# Patient Record
Sex: Female | Born: 1985 | Race: White | Hispanic: No | Smoking: Never smoker
Health system: Southern US, Community
[De-identification: ages and names within clinical notes are randomized; demographics above are authoritative.]

## PROBLEM LIST (undated history)

## (undated) ENCOUNTER — Inpatient Hospital Stay (HOSPITAL_COMMUNITY): Payer: Self-pay

## (undated) DIAGNOSIS — M5442 Lumbago with sciatica, left side: Secondary | ICD-10-CM

## (undated) DIAGNOSIS — F32A Depression, unspecified: Secondary | ICD-10-CM

## (undated) DIAGNOSIS — R001 Bradycardia, unspecified: Secondary | ICD-10-CM

## (undated) DIAGNOSIS — E663 Overweight: Secondary | ICD-10-CM

## (undated) DIAGNOSIS — J302 Other seasonal allergic rhinitis: Secondary | ICD-10-CM

## (undated) DIAGNOSIS — N946 Dysmenorrhea, unspecified: Secondary | ICD-10-CM

## (undated) DIAGNOSIS — J45909 Unspecified asthma, uncomplicated: Secondary | ICD-10-CM

## (undated) DIAGNOSIS — F419 Anxiety disorder, unspecified: Secondary | ICD-10-CM

## (undated) DIAGNOSIS — L239 Allergic contact dermatitis, unspecified cause: Secondary | ICD-10-CM

## (undated) DIAGNOSIS — F329 Major depressive disorder, single episode, unspecified: Secondary | ICD-10-CM

## (undated) DIAGNOSIS — D500 Iron deficiency anemia secondary to blood loss (chronic): Secondary | ICD-10-CM

## (undated) DIAGNOSIS — Z803 Family history of malignant neoplasm of breast: Secondary | ICD-10-CM

## (undated) DIAGNOSIS — N920 Excessive and frequent menstruation with regular cycle: Secondary | ICD-10-CM

## (undated) DIAGNOSIS — M79671 Pain in right foot: Secondary | ICD-10-CM

## (undated) HISTORY — PX: MYRINGOPLASTY: SUR873

## (undated) HISTORY — DX: Iron deficiency anemia secondary to blood loss (chronic): D50.0

## (undated) HISTORY — DX: Dysmenorrhea, unspecified: N94.6

## (undated) HISTORY — DX: Overweight: E66.3

## (undated) HISTORY — DX: Depression, unspecified: F32.A

## (undated) HISTORY — DX: Unspecified asthma, uncomplicated: J45.909

## (undated) HISTORY — PX: NO PAST SURGERIES: SHX2092

## (undated) HISTORY — DX: Pain in right foot: M79.671

## (undated) HISTORY — DX: Allergic contact dermatitis, unspecified cause: L23.9

## (undated) HISTORY — DX: Family history of malignant neoplasm of breast: Z80.3

## (undated) HISTORY — DX: Anxiety disorder, unspecified: F41.9

## (undated) HISTORY — DX: Lumbago with sciatica, left side: M54.42

## (undated) HISTORY — DX: Major depressive disorder, single episode, unspecified: F32.9

## (undated) HISTORY — DX: Other seasonal allergic rhinitis: J30.2

## (undated) HISTORY — DX: Excessive and frequent menstruation with regular cycle: N92.0

---

## 2014-10-13 ENCOUNTER — Encounter: Payer: Self-pay | Admitting: Family Medicine

## 2014-10-13 ENCOUNTER — Ambulatory Visit (INDEPENDENT_AMBULATORY_CARE_PROVIDER_SITE_OTHER): Payer: BC Managed Care – PPO | Admitting: Family Medicine

## 2014-10-13 VITALS — BP 118/70 | HR 104 | Temp 98.4°F | Resp 16 | Ht 68.0 in | Wt 199.3 lb

## 2014-10-13 DIAGNOSIS — N946 Dysmenorrhea, unspecified: Secondary | ICD-10-CM | POA: Insufficient documentation

## 2014-10-13 DIAGNOSIS — M5440 Lumbago with sciatica, unspecified side: Secondary | ICD-10-CM | POA: Insufficient documentation

## 2014-10-13 DIAGNOSIS — H60339 Swimmer's ear, unspecified ear: Secondary | ICD-10-CM | POA: Insufficient documentation

## 2014-10-13 DIAGNOSIS — N920 Excessive and frequent menstruation with regular cycle: Secondary | ICD-10-CM | POA: Insufficient documentation

## 2014-10-13 DIAGNOSIS — Z111 Encounter for screening for respiratory tuberculosis: Secondary | ICD-10-CM | POA: Insufficient documentation

## 2014-10-13 DIAGNOSIS — Z309 Encounter for contraceptive management, unspecified: Secondary | ICD-10-CM | POA: Insufficient documentation

## 2014-10-13 DIAGNOSIS — Z3040 Encounter for surveillance of contraceptives, unspecified: Secondary | ICD-10-CM

## 2014-10-13 DIAGNOSIS — J302 Other seasonal allergic rhinitis: Secondary | ICD-10-CM | POA: Insufficient documentation

## 2014-10-13 DIAGNOSIS — IMO0001 Reserved for inherently not codable concepts without codable children: Secondary | ICD-10-CM | POA: Insufficient documentation

## 2014-10-13 DIAGNOSIS — M79673 Pain in unspecified foot: Secondary | ICD-10-CM | POA: Insufficient documentation

## 2014-10-13 DIAGNOSIS — L239 Allergic contact dermatitis, unspecified cause: Secondary | ICD-10-CM | POA: Insufficient documentation

## 2014-10-13 DIAGNOSIS — J069 Acute upper respiratory infection, unspecified: Secondary | ICD-10-CM | POA: Insufficient documentation

## 2014-10-13 DIAGNOSIS — Z9229 Personal history of other drug therapy: Secondary | ICD-10-CM | POA: Insufficient documentation

## 2014-10-13 DIAGNOSIS — F418 Other specified anxiety disorders: Secondary | ICD-10-CM

## 2014-10-13 DIAGNOSIS — F329 Major depressive disorder, single episode, unspecified: Secondary | ICD-10-CM

## 2014-10-13 DIAGNOSIS — R21 Rash and other nonspecific skin eruption: Secondary | ICD-10-CM | POA: Insufficient documentation

## 2014-10-13 DIAGNOSIS — Z9109 Other allergy status, other than to drugs and biological substances: Secondary | ICD-10-CM | POA: Insufficient documentation

## 2014-10-13 DIAGNOSIS — R300 Dysuria: Secondary | ICD-10-CM | POA: Insufficient documentation

## 2014-10-13 DIAGNOSIS — Z0000 Encounter for general adult medical examination without abnormal findings: Secondary | ICD-10-CM | POA: Insufficient documentation

## 2014-10-13 DIAGNOSIS — J45909 Unspecified asthma, uncomplicated: Secondary | ICD-10-CM | POA: Insufficient documentation

## 2014-10-13 DIAGNOSIS — Z713 Dietary counseling and surveillance: Secondary | ICD-10-CM | POA: Insufficient documentation

## 2014-10-13 DIAGNOSIS — Z13220 Encounter for screening for lipoid disorders: Secondary | ICD-10-CM | POA: Insufficient documentation

## 2014-10-13 DIAGNOSIS — Z124 Encounter for screening for malignant neoplasm of cervix: Secondary | ICD-10-CM | POA: Insufficient documentation

## 2014-10-13 DIAGNOSIS — D509 Iron deficiency anemia, unspecified: Secondary | ICD-10-CM | POA: Insufficient documentation

## 2014-10-13 DIAGNOSIS — J029 Acute pharyngitis, unspecified: Secondary | ICD-10-CM | POA: Insufficient documentation

## 2014-10-13 DIAGNOSIS — R238 Other skin changes: Secondary | ICD-10-CM | POA: Insufficient documentation

## 2014-10-13 DIAGNOSIS — Z23 Encounter for immunization: Secondary | ICD-10-CM | POA: Insufficient documentation

## 2014-10-13 DIAGNOSIS — Z719 Counseling, unspecified: Secondary | ICD-10-CM | POA: Insufficient documentation

## 2014-10-13 DIAGNOSIS — F419 Anxiety disorder, unspecified: Secondary | ICD-10-CM | POA: Insufficient documentation

## 2014-10-13 DIAGNOSIS — M5430 Sciatica, unspecified side: Secondary | ICD-10-CM | POA: Insufficient documentation

## 2014-10-13 DIAGNOSIS — E663 Overweight: Secondary | ICD-10-CM | POA: Insufficient documentation

## 2014-10-13 DIAGNOSIS — Z3042 Encounter for surveillance of injectable contraceptive: Secondary | ICD-10-CM | POA: Insufficient documentation

## 2014-10-13 MED ORDER — FLUOXETINE HCL 20 MG PO TABS: 20.0000 mg | ORAL_TABLET | Freq: Every day | ORAL | Status: DC

## 2014-10-13 NOTE — Patient Instructions (Signed)
Intrauterine Device Information An intrauterine device (IUD) is inserted into your uterus to prevent pregnancy. There are two types of IUDs available:   Copper IUD--This type of IUD is wrapped in copper wire and is placed inside the uterus. Copper makes the uterus and fallopian tubes produce a fluid that kills sperm. The copper IUD can stay in place for 10 years.  Hormone IUD--This type of IUD contains the hormone progestin (synthetic progesterone). The hormone thickens the cervical mucus and prevents sperm from entering the uterus. It also thins the uterine lining to prevent implantation of a fertilized egg. The hormone can weaken or kill the sperm that get into the uterus. One type of hormone IUD can stay in place for 5 years, and another type can stay in place for 3 years. Your health care provider will make sure you are a good candidate for a contraceptive IUD. Discuss with your health care provider the possible side effects.  ADVANTAGES OF AN INTRAUTERINE DEVICE  IUDs are highly effective, reversible, long acting, and low maintenance.   There are no estrogen-related side effects.   An IUD can be used when breastfeeding.   IUDs are not associated with weight gain.   The copper IUD works immediately after insertion.   The hormone IUD works right away if inserted within 7 days of your period starting. You will need to use a backup method of birth control for 7 days if the hormone IUD is inserted at any other time in your cycle.  The copper IUD does not interfere with your female hormones.   The hormone IUD can make heavy menstrual periods lighter and decrease cramping.   The hormone IUD can be used for 3 or 5 years.   The copper IUD can be used for 10 years. DISADVANTAGES OF AN INTRAUTERINE DEVICE  The hormone IUD can be associated with irregular bleeding patterns.   The copper IUD can make your menstrual flow heavier and more painful.   You may experience cramping and  vaginal bleeding after insertion.  Document Released: 02/05/2004 Document Revised: 11/03/2012 Document Reviewed: 08/22/2012 Exit[employer] Patient Information 2015 [employer]. This information is not intended to replace advice given to you by your health care provider. Make sure you discuss any questions you have with your health care provider.  

## 2014-10-13 NOTE — Progress Notes (Signed)
Name: Anna Whitehead   MRN: [id number]    DOB: 1985-10-29   Date:10/13/2014       Progress Note  Subjective  Chief Complaint  Chief Complaint  Patient presents with  . Anxiety    patient has had a lot going on since the first of the year: marriage, new job and starting a new birht control.    HPI  Alayla Dethlefs is a pleasant 29 year old patient who presents to clinic today to discuss her moods although appointment was initially for birth control but she states this is related. She was previously on Depo Provera but due to intolerable mood fluctuations she returned to her Sprintec OCP which she is taking consistently. Her PCP Dr. Carlynn Purl prescribed Jackey Loge Ring at some point and referred patient to OB/GYN in [facility] for insertion of IUD. She did not use Nuva Ring and is still on Sprintec OCPs and notes weight gain of about 30lbs in general. She did consult with Gynecologist and is still undecided on IUD, considering Paraguard over Mirena due to no hormones which she feels affects her weight and moods. She has previous had anxiety and was on Prozac about 10 years ago and is feeling more overwhelmed these days which the changes going on in her life. Her mother has been on Prozac for decades and works well for her. She is not overtly depressed, down, crying, suicidal or unmotivated.   Patient Active Problem List   Diagnosis Date Noted  . Abnormal skin color 10/13/2014  . Encounter for general adult medical examination without abnormal findings 10/13/2014  . Asthma, mild 10/13/2014  . Cervical cancer screening 10/13/2014  . Depo contraception 10/13/2014  . Dysmenorrhea 10/13/2014  . Difficult or painful urination 10/13/2014  . Allergic contact dermatitis 10/13/2014  . Allergy to environmental factors 10/13/2014  . Beach ear 10/13/2014  . Contraceptive management 10/13/2014  . Foot pain 10/13/2014  . Gravida 0 10/13/2014  . Encounter for counseling 10/13/2014  . Need for vaccination 10/13/2014   . H/O: depression 10/13/2014  . Encounter for immunization 10/13/2014  . Anemia, iron deficiency 10/13/2014  . Encounter for screening for lipoid disorders 10/13/2014  . H/O high risk medication treatment 10/13/2014  . Excess, menstruation 10/13/2014  . Low back pain with sciatica 10/13/2014  . Overweight 10/13/2014  . Cutaneous eruption 10/13/2014  . Allergic rhinitis, seasonal 10/13/2014  . Sore throat 10/13/2014  . Acute upper respiratory infection 10/13/2014  . Encounter for screening for respiratory tuberculosis 10/13/2014  . Dietary counseling and surveillance 10/13/2014    History  Substance Use Topics  . Smoking status: Never Smoker   . Smokeless tobacco: Not on file  . Alcohol Use: 0.0 oz/week    0 Standard drinks or equivalent per week     Comment: ocassionally     Current outpatient prescriptions:  .  albuterol (VENTOLIN HFA) 108 (90 BASE) MCG/ACT inhaler, Inhale into the lungs., Disp: , Rfl:  .  norgestimate-ethinyl estradiol (MONONESSA) 0.25-35 MG-MCG tablet, Take by mouth., Disp: , Rfl:   Past Surgical History  Procedure Laterality Date  . Myringoplasty      Family History  Problem Relation Age of Onset  . Bipolar disorder Mother   . Cancer Maternal Aunt     3 of her aunts had breast cancer: 2 passed away    No Known Allergies  Depression screen [facility] 2/9 10/13/2014  Decreased Interest 1  Down, Depressed, Hopeless 1  PHQ - 2 Score 2  Altered sleeping 1  Tired, decreased energy 1  Change in appetite 1  Feeling bad or failure about yourself  2  Trouble concentrating 1  Moving slowly or fidgety/restless 0  Suicidal thoughts 0  PHQ-9 Score 8  Difficult doing work/chores Somewhat difficult    Review of Systems  Ten systems reviewed and is negative except as mentioned in HPI.  Objective  BP 118/70 mmHg  Pulse 104  Temp(Src) 98.4 F (36.9 C) (Oral)  Resp 16  Ht  (1.727 m)  Wt 199 lb 4.8 oz (90.402 kg)  BMI 30.31 kg/m2  SpO2 99%  LMP  09/29/2014 (Approximate) Body mass index is 30.31 kg/(m^2).  Physical Exam  Constitutional: Patient is overweight and well-nourished. In no distress.   Neck: Normal range of motion. Neck supple. No JVD present. No thyromegaly present.  Cardiovascular: Normal rate, regular rhythm and normal heart sounds.  No murmur heard.  Pulmonary/Chest: Effort normal and breath sounds normal. No respiratory distress. Musculoskeletal: Normal range of motion bilateral UE and LE, no joint effusions. Peripheral vascular: Bilateral LE no edema. Neurological: CN II-XII grossly intact with no focal deficits. Alert and oriented to person, place, and time. Coordination, balance, strength, speech and gait are normal.  Skin: Skin is warm and dry. No rash noted. No erythema.  Psychiatric: Patient has a normal mood and affect. Behavior is normal in office today. Judgment and thought content normal in office today.  Assessment & Plan  1. Encounter for surveillance of contraceptives We discussed her family planning goals. Recently married, planning on children in 1-2 years. Encouraged her to consider IUD if she is worried about the side effects from hormonal birth control.   2. Anxiety and depression Start back on Prozac. The patient has been counseled on the proper use, side effects and potential interactions of the new medication. Patient encouraged to review the side effects and safety profile pamphlet provided with the prescription from the pharmacy as well as request counseling from the pharmacy team as needed.   - FLUoxetine (PROZAC) 20 MG tablet; Take 1 tablet (20 mg total) by mouth daily.  Dispense: 30 tablet; Refill: 3

## 2015-03-18 NOTE — L&D Delivery Note (Signed)
Delivery Note  G1 at 5250w2d, IOL / Gest HTN  Complete dilation at 1904 Onset of pushing at 1910 FHR second stage Category 1  Analgesia /Anesthesia intrapartum: epidural  Delivery of a viable female at 2044 by CNM in OA to LOT position.  Nuchal Cord none. Cord double clamped at 30 sec., cut by Para Marchuncan, FOB..  Cord blood sample collected. Collection of cord blood donation performed.  Placenta delivered Schultz intact with 3 VC.  Placenta to L&D for disposal. Uterine tone firm, bleeding small  2nd degree vaginal floor laceration identified.  Anesthesia: epidural Repair 3.0 vicryl in standard fashion, good hemostasis Est. Blood Loss (mL): 300  Complications: none APGAR: 9/9 Mom to postpartum.  Baby to Couplet care / Skin to Skin.  Neta Mendsaniela C Paul, CNM, MSN 11/14/2015, 9:34 PM

## 2015-10-29 ENCOUNTER — Encounter (HOSPITAL_COMMUNITY): Payer: Self-pay | Admitting: *Deleted

## 2015-10-29 ENCOUNTER — Inpatient Hospital Stay (HOSPITAL_COMMUNITY)
Admission: AD | Admit: 2015-10-29 | Discharge: 2015-10-29 | Disposition: A | Discharge status: Home / Self Care | Payer: BC Managed Care – PPO | Source: Ambulatory Visit | Attending: Obstetrics and Gynecology | Admitting: Obstetrics and Gynecology

## 2015-10-29 DIAGNOSIS — Z3A00 Weeks of gestation of pregnancy not specified: Secondary | ICD-10-CM | POA: Insufficient documentation

## 2015-10-29 DIAGNOSIS — Z3403 Encounter for supervision of normal first pregnancy, third trimester: Secondary | ICD-10-CM | POA: Insufficient documentation

## 2015-10-29 MED ORDER — LACTATED RINGERS IV BOLUS (SEPSIS)
1000.0000 mL | Freq: Once | INTRAVENOUS | Status: AC
  Administered 2015-10-29: 1000 mL via INTRAVENOUS

## 2015-10-29 MED ORDER — NALBUPHINE HCL 10 MG/ML IJ SOLN
10.0000 mg | Freq: Once | INTRAMUSCULAR | Status: AC
  Administered 2015-10-29: 10 mg via SUBCUTANEOUS
  Filled 2015-10-29: qty 1

## 2015-10-29 NOTE — Discharge Instructions (Signed)
Fetal Movement Counts °Patient Name: __________________________________________________ Patient Due Date: ____________________ °Performing a fetal movement count is highly recommended in high-risk pregnancies, but it is good for every pregnant woman to do. Your health care provider may ask you to start counting fetal movements at 28 weeks of the pregnancy. Fetal movements often increase: °· After eating a full meal. °· After physical activity. °· After eating or drinking something sweet or cold. °· At rest. °Pay attention to when you feel the baby is most active. This will help you notice a pattern of your baby's sleep and wake cycles and what factors contribute to an increase in fetal movement. It is important to perform a fetal movement count at the same time each day when your baby is normally most active.  °HOW TO COUNT FETAL MOVEMENTS °1. Find a quiet and comfortable area to sit or lie down on your left side. Lying on your left side provides the best blood and oxygen circulation to your baby. °2. Write down the day and time on a sheet of paper or in a journal. °3. Start counting kicks, flutters, swishes, rolls, or jabs in a 2-hour period. You should feel at least 10 movements within 2 hours. °4. If you do not feel 10 movements in 2 hours, wait 2-3 hours and count again. Look for a change in the pattern or not enough counts in 2 hours. °SEEK MEDICAL CARE IF: °· You feel less than 10 counts in 2 hours, tried twice. °· There is no movement in over an hour. °· The pattern is changing or taking longer each day to reach 10 counts in 2 hours. °· You feel the baby is not moving as he or she usually does. °Date: ____________ Movements: ____________ Start time: ____________ Finish time: ____________  °Date: ____________ Movements: ____________ Start time: ____________ Finish time: ____________ °Date: ____________ Movements: ____________ Start time: ____________ Finish time: ____________ °Date: ____________ Movements:  ____________ Start time: ____________ Finish time: ____________ °Date: ____________ Movements: ____________ Start time: ____________ Finish time: ____________ °Date: ____________ Movements: ____________ Start time: ____________ Finish time: ____________ °Date: ____________ Movements: ____________ Start time: ____________ Finish time: ____________ °Date: ____________ Movements: ____________ Start time: ____________ Finish time: ____________  °Date: ____________ Movements: ____________ Start time: ____________ Finish time: ____________ °Date: ____________ Movements: ____________ Start time: ____________ Finish time: ____________ °Date: ____________ Movements: ____________ Start time: ____________ Finish time: ____________ °Date: ____________ Movements: ____________ Start time: ____________ Finish time: ____________ °Date: ____________ Movements: ____________ Start time: ____________ Finish time: ____________ °Date: ____________ Movements: ____________ Start time: ____________ Finish time: ____________ °Date: ____________ Movements: ____________ Start time: ____________ Finish time: ____________  °Date: ____________ Movements: ____________ Start time: ____________ Finish time: ____________ °Date: ____________ Movements: ____________ Start time: ____________ Finish time: ____________ °Date: ____________ Movements: ____________ Start time: ____________ Finish time: ____________ °Date: ____________ Movements: ____________ Start time: ____________ Finish time: ____________ °Date: ____________ Movements: ____________ Start time: ____________ Finish time: ____________ °Date: ____________ Movements: ____________ Start time: ____________ Finish time: ____________ °Date: ____________ Movements: ____________ Start time: ____________ Finish time: ____________  °Date: ____________ Movements: ____________ Start time: ____________ Finish time: ____________ °Date: ____________ Movements: ____________ Start time: ____________ Finish  time: ____________ °Date: ____________ Movements: ____________ Start time: ____________ Finish time: ____________ °Date: ____________ Movements: ____________ Start time: ____________ Finish time: ____________ °Date: ____________ Movements: ____________ Start time: ____________ Finish time: ____________ °Date: ____________ Movements: ____________ Start time: ____________ Finish time: ____________ °Date: ____________ Movements: ____________ Start time: ____________ Finish time: ____________  °Date: ____________ Movements: ____________ Start time: ____________ Finish   time: ____________ °Date: ____________ Movements: ____________ Start time: ____________ Finish time: ____________ °Date: ____________ Movements: ____________ Start time: ____________ Finish time: ____________ °Date: ____________ Movements: ____________ Start time: ____________ Finish time: ____________ °Date: ____________ Movements: ____________ Start time: ____________ Finish time: ____________ °Date: ____________ Movements: ____________ Start time: ____________ Finish time: ____________ °Date: ____________ Movements: ____________ Start time: ____________ Finish time: ____________  °Date: ____________ Movements: ____________ Start time: ____________ Finish time: ____________ °Date: ____________ Movements: ____________ Start time: ____________ Finish time: ____________ °Date: ____________ Movements: ____________ Start time: ____________ Finish time: ____________ °Date: ____________ Movements: ____________ Start time: ____________ Finish time: ____________ °Date: ____________ Movements: ____________ Start time: ____________ Finish time: ____________ °Date: ____________ Movements: ____________ Start time: ____________ Finish time: ____________ °Date: ____________ Movements: ____________ Start time: ____________ Finish time: ____________  °Date: ____________ Movements: ____________ Start time: ____________ Finish time: ____________ °Date: ____________  Movements: ____________ Start time: ____________ Finish time: ____________ °Date: ____________ Movements: ____________ Start time: ____________ Finish time: ____________ °Date: ____________ Movements: ____________ Start time: ____________ Finish time: ____________ °Date: ____________ Movements: ____________ Start time: ____________ Finish time: ____________ °Date: ____________ Movements: ____________ Start time: ____________ Finish time: ____________ °Date: ____________ Movements: ____________ Start time: ____________ Finish time: ____________  °Date: ____________ Movements: ____________ Start time: ____________ Finish time: ____________ °Date: ____________ Movements: ____________ Start time: ____________ Finish time: ____________ °Date: ____________ Movements: ____________ Start time: ____________ Finish time: ____________ °Date: ____________ Movements: ____________ Start time: ____________ Finish time: ____________ °Date: ____________ Movements: ____________ Start time: ____________ Finish time: ____________ °Date: ____________ Movements: ____________ Start time: ____________ Finish time: ____________ °  °This information is not intended to replace advice given to you by your health care provider. Make sure you discuss any questions you have with your health care provider. °  °Document Released: 04/02/2006 Document Revised: 03/24/2014 Document Reviewed: 12/29/2011 °Elsevier Interactive Patient Education ©2016 [employer] °Braxton Hicks Contractions °Contractions of the uterus can occur throughout pregnancy. Contractions are not always a sign that you are in labor.  °WHAT ARE BRAXTON HICKS CONTRACTIONS?  °Contractions that occur before labor are called Braxton Hicks contractions, or false labor. Toward the end of pregnancy (32-34 weeks), these contractions can develop more often and may become more forceful. This is not true labor because these contractions do not result in opening (dilatation) and thinning of  the cervix. They are sometimes difficult to tell apart from true labor because these contractions can be forceful and people have different pain tolerances. You should not feel embarrassed if you go to the hospital with false labor. Sometimes, the only way to tell if you are in true labor is for your health care provider to look for changes in the cervix. °If there are no prenatal problems or other health problems associated with the pregnancy, it is completely safe to be sent home with false labor and await the onset of true labor. °HOW CAN YOU TELL THE DIFFERENCE BETWEEN TRUE AND FALSE LABOR? °False Labor °· The contractions of false labor are usually shorter and not as hard as those of true labor.   °· The contractions are usually irregular.   °· The contractions are often felt in the front of the lower abdomen and in the groin.   °· The contractions may go away when you walk around or change positions while lying down.   °· The contractions get weaker and are shorter lasting as time goes on.   °· The contractions do not usually become progressively stronger, regular, and closer together as with true labor.   °True Labor °· Contractions in true   labor last 30-70 seconds, become very regular, usually become more intense, and increase in frequency.   °· The contractions do not go away with walking.   °· The discomfort is usually felt in the top of the uterus and spreads to the lower abdomen and low back.   °· True labor can be determined by your health care provider with an exam. This will show that the cervix is dilating and getting thinner.   °WHAT TO REMEMBER °· Keep up with your usual exercises and follow other instructions given by your health care provider.   °· Take medicines as directed by your health care provider.   °· Keep your regular prenatal appointments.   °· Eat and drink lightly if you think you are going into labor.   °· If Braxton Hicks contractions are making you uncomfortable:   °¨ Change your  position from lying down or resting to walking, or from walking to resting.   °¨ Sit and rest in a tub of warm water.   °¨ Drink 2-3 glasses of water. Dehydration may cause these contractions.   °¨ Do slow and deep breathing several times an hour.   °WHEN SHOULD I SEEK IMMEDIATE MEDICAL CARE? °Seek immediate medical care if: °· Your contractions become stronger, more regular, and closer together.   °· You have fluid leaking or gushing from your vagina.   °· You have a fever.   °· You pass blood-tinged mucus.   °· You have vaginal bleeding.   °· You have continuous abdominal pain.   °· You have low back pain that you never had before.   °· You feel your baby's head pushing down and causing pelvic pressure.   °· Your baby is not moving as much as it used to.   °  °This information is not intended to replace advice given to you by your health care provider. Make sure you discuss any questions you have with your health care provider. °  °Document Released: 03/03/2005 Document Revised: 03/08/2013 Document Reviewed: 12/13/2012 °Elsevier Interactive Patient Education ©2016 [employer] ° °

## 2015-11-13 ENCOUNTER — Encounter (HOSPITAL_COMMUNITY): Payer: Self-pay | Admitting: *Deleted

## 2015-11-13 ENCOUNTER — Inpatient Hospital Stay (HOSPITAL_COMMUNITY)
Admission: AD | Admit: 2015-11-13 | Discharge: 2015-11-16 | DRG: 775 | Disposition: A | Discharge status: Home / Self Care | Payer: BC Managed Care – PPO | Source: Ambulatory Visit | Attending: Obstetrics & Gynecology | Admitting: Obstetrics & Gynecology

## 2015-11-13 DIAGNOSIS — Z6791 Unspecified blood type, Rh negative: Secondary | ICD-10-CM | POA: Diagnosis not present

## 2015-11-13 DIAGNOSIS — Z3490 Encounter for supervision of normal pregnancy, unspecified, unspecified trimester: Secondary | ICD-10-CM

## 2015-11-13 DIAGNOSIS — F329 Major depressive disorder, single episode, unspecified: Secondary | ICD-10-CM | POA: Diagnosis present

## 2015-11-13 DIAGNOSIS — O99344 Other mental disorders complicating childbirth: Secondary | ICD-10-CM | POA: Diagnosis present

## 2015-11-13 DIAGNOSIS — F418 Other specified anxiety disorders: Secondary | ICD-10-CM | POA: Diagnosis present

## 2015-11-13 DIAGNOSIS — D509 Iron deficiency anemia, unspecified: Secondary | ICD-10-CM | POA: Diagnosis present

## 2015-11-13 DIAGNOSIS — Z3A38 38 weeks gestation of pregnancy: Secondary | ICD-10-CM | POA: Diagnosis not present

## 2015-11-13 DIAGNOSIS — D62 Acute posthemorrhagic anemia: Secondary | ICD-10-CM | POA: Diagnosis present

## 2015-11-13 DIAGNOSIS — O134 Gestational [pregnancy-induced] hypertension without significant proteinuria, complicating childbirth: Secondary | ICD-10-CM | POA: Diagnosis present

## 2015-11-13 DIAGNOSIS — O26893 Other specified pregnancy related conditions, third trimester: Secondary | ICD-10-CM | POA: Diagnosis present

## 2015-11-13 DIAGNOSIS — O99824 Streptococcus B carrier state complicating childbirth: Secondary | ICD-10-CM | POA: Diagnosis present

## 2015-11-13 DIAGNOSIS — O9081 Anemia of the puerperium: Secondary | ICD-10-CM | POA: Diagnosis present

## 2015-11-13 DIAGNOSIS — Z8759 Personal history of other complications of pregnancy, childbirth and the puerperium: Secondary | ICD-10-CM | POA: Diagnosis present

## 2015-11-13 DIAGNOSIS — F32A Depression, unspecified: Secondary | ICD-10-CM | POA: Diagnosis present

## 2015-11-13 DIAGNOSIS — F419 Anxiety disorder, unspecified: Secondary | ICD-10-CM | POA: Diagnosis present

## 2015-11-13 LAB — COMPREHENSIVE METABOLIC PANEL
ALBUMIN: 3.2 g/dL — AB (ref 3.5–5.0)
ALT: 11 U/L — ABNORMAL LOW (ref 14–54)
ANION GAP: 5 (ref 5–15)
AST: 21 U/L (ref 15–41)
Alkaline Phosphatase: 121 U/L (ref 38–126)
BILIRUBIN TOTAL: 0.4 mg/dL (ref 0.3–1.2)
BUN: 9 mg/dL (ref 6–20)
CO2: 25 mmol/L (ref 22–32)
Calcium: 8.5 mg/dL — ABNORMAL LOW (ref 8.9–10.3)
Chloride: 105 mmol/L (ref 101–111)
Creatinine, Ser: 0.63 mg/dL (ref 0.44–1.00)
Glucose, Bld: 107 mg/dL — ABNORMAL HIGH (ref 65–99)
POTASSIUM: 3.7 mmol/L (ref 3.5–5.1)
Sodium: 135 mmol/L (ref 135–145)
TOTAL PROTEIN: 6.6 g/dL (ref 6.5–8.1)

## 2015-11-13 LAB — OB RESULTS CONSOLE HIV ANTIBODY (ROUTINE TESTING): HIV: NONREACTIVE

## 2015-11-13 LAB — CBC
HCT: 30.8 % — ABNORMAL LOW (ref 36.0–46.0)
HEMOGLOBIN: 9.3 g/dL — AB (ref 12.0–15.0)
MCH: 24.7 pg — ABNORMAL LOW (ref 26.0–34.0)
MCHC: 30.2 g/dL (ref 30.0–36.0)
MCV: 81.9 fL (ref 78.0–100.0)
Platelets: 182 10*3/uL (ref 150–400)
RBC: 3.76 MIL/uL — ABNORMAL LOW (ref 3.87–5.11)
RDW: 17.2 % — AB (ref 11.5–15.5)
WBC: 7.3 10*3/uL (ref 4.0–10.5)

## 2015-11-13 LAB — TYPE AND SCREEN
ABO/RH(D): O NEG
ANTIBODY SCREEN: NEGATIVE

## 2015-11-13 LAB — OB RESULTS CONSOLE GC/CHLAMYDIA
Chlamydia: NEGATIVE
Gonorrhea: NEGATIVE

## 2015-11-13 LAB — ABO/RH: ABO/RH(D): O NEG

## 2015-11-13 LAB — OB RESULTS CONSOLE HEPATITIS B SURFACE ANTIGEN: HEP B S AG: NEGATIVE

## 2015-11-13 LAB — LACTATE DEHYDROGENASE: LDH: 173 U/L (ref 98–192)

## 2015-11-13 LAB — PROTEIN / CREATININE RATIO, URINE
Creatinine, Urine: 131 mg/dL
Protein Creatinine Ratio: 0.08 mg/mg{Cre} (ref 0.00–0.15)
TOTAL PROTEIN, URINE: 11 mg/dL

## 2015-11-13 LAB — OB RESULTS CONSOLE RPR: RPR: NONREACTIVE

## 2015-11-13 LAB — OB RESULTS CONSOLE RUBELLA ANTIBODY, IGM: Rubella: IMMUNE

## 2015-11-13 LAB — OB RESULTS CONSOLE ABO/RH: RH TYPE: NEGATIVE

## 2015-11-13 LAB — OB RESULTS CONSOLE GBS: STREP GROUP B AG: POSITIVE

## 2015-11-13 LAB — OB RESULTS CONSOLE ANTIBODY SCREEN: ANTIBODY SCREEN: NEGATIVE

## 2015-11-13 MED ORDER — FENTANYL CITRATE (PF) 100 MCG/2ML IJ SOLN
100.0000 ug | INTRAMUSCULAR | Status: DC | PRN
  Administered 2015-11-13 – 2015-11-14 (×3): 100 ug via INTRAVENOUS
  Filled 2015-11-13 (×2): qty 2

## 2015-11-13 MED ORDER — OXYCODONE-ACETAMINOPHEN 5-325 MG PO TABS: 1.0000 | ORAL_TABLET | ORAL | Status: DC | PRN

## 2015-11-13 MED ORDER — PENICILLIN G POTASSIUM 5000000 UNITS IJ SOLR
5.0000 10*6.[IU] | Freq: Once | INTRAVENOUS | Status: AC
  Administered 2015-11-14: 5 10*6.[IU] via INTRAVENOUS
  Filled 2015-11-13 (×2): qty 5

## 2015-11-13 MED ORDER — ACETAMINOPHEN 325 MG PO TABS: 650.0000 mg | ORAL_TABLET | ORAL | Status: DC | PRN

## 2015-11-13 MED ORDER — SOD CITRATE-CITRIC ACID 500-334 MG/5ML PO SOLN: 30.0000 mL | ORAL | Status: DC | PRN

## 2015-11-13 MED ORDER — FLEET ENEMA 7-19 GM/118ML RE ENEM: 1.0000 | ENEMA | RECTAL | Status: DC | PRN

## 2015-11-13 MED ORDER — MAGNESIUM SULFATE 50 % IJ SOLN: 2.0000 g/h | INTRAVENOUS | Status: DC

## 2015-11-13 MED ORDER — ONDANSETRON HCL 4 MG/2ML IJ SOLN
4.0000 mg | Freq: Four times a day (QID) | INTRAMUSCULAR | Status: DC | PRN
  Administered 2015-11-13 – 2015-11-14 (×2): 4 mg via INTRAVENOUS
  Filled 2015-11-13 (×2): qty 2

## 2015-11-13 MED ORDER — OXYTOCIN 40 UNITS IN LACTATED RINGERS INFUSION - SIMPLE MED: 2.5000 [IU]/h | INTRAVENOUS | Status: DC

## 2015-11-13 MED ORDER — TERBUTALINE SULFATE 1 MG/ML IJ SOLN
0.2500 mg | Freq: Once | INTRAMUSCULAR | Status: DC | PRN
  Filled 2015-11-13: qty 1

## 2015-11-13 MED ORDER — OXYTOCIN BOLUS FROM INFUSION
500.0000 mL | Freq: Once | INTRAVENOUS | Status: AC
  Administered 2015-11-14: 500 mL via INTRAVENOUS

## 2015-11-13 MED ORDER — MISOPROSTOL 25 MCG QUARTER TABLET
25.0000 ug | ORAL_TABLET | ORAL | Status: DC | PRN
  Administered 2015-11-13 (×2): 25 ug via VAGINAL
  Filled 2015-11-13: qty 1
  Filled 2015-11-13 (×2): qty 0.25

## 2015-11-13 MED ORDER — LACTATED RINGERS IV SOLN
INTRAVENOUS | Status: DC
  Administered 2015-11-13 – 2015-11-14 (×2): via INTRAVENOUS

## 2015-11-13 MED ORDER — LIDOCAINE HCL (PF) 1 % IJ SOLN
30.0000 mL | INTRAMUSCULAR | Status: DC | PRN
  Filled 2015-11-13: qty 30

## 2015-11-13 MED ORDER — FAMOTIDINE IN NACL 20-0.9 MG/50ML-% IV SOLN
20.0000 mg | Freq: Two times a day (BID) | INTRAVENOUS | Status: DC
  Administered 2015-11-13 – 2015-11-14 (×3): 20 mg via INTRAVENOUS
  Filled 2015-11-13 (×4): qty 50

## 2015-11-13 MED ORDER — FENTANYL CITRATE (PF) 100 MCG/2ML IJ SOLN
INTRAMUSCULAR | Status: AC
  Administered 2015-11-13: 100 ug via INTRAVENOUS
  Filled 2015-11-13: qty 2

## 2015-11-13 MED ORDER — LACTATED RINGERS IV SOLN: 500.0000 mL | INTRAVENOUS | Status: DC | PRN

## 2015-11-13 MED ORDER — OXYCODONE-ACETAMINOPHEN 5-325 MG PO TABS: 2.0000 | ORAL_TABLET | ORAL | Status: DC | PRN

## 2015-11-13 MED ORDER — PENICILLIN G POTASSIUM 5000000 UNITS IJ SOLR
2.5000 10*6.[IU] | INTRAVENOUS | Status: DC
  Administered 2015-11-14 (×2): 2.5 10*6.[IU] via INTRAVENOUS
  Filled 2015-11-13 (×8): qty 2.5

## 2015-11-13 NOTE — Progress Notes (Signed)
Patient ID: Anna GreeningSarah Whitehead, female   DOB: 1986/01/07, 30 y.o.   MRN: [id number] Subjective: Anna GreeningSarah Whitehead is a 30 y.o. G1P0 at 6567w1d by LMP admitted for induction of labor due to pre-eclampsia without severe features.  Objective: Vitals:   11/13/15 1522 11/13/15 1622 11/13/15 1739 11/13/15 1832  BP: 125/85 118/71    Pulse: 86 77    Resp: 18 18 18 20   Temp:      TempSrc:      Weight:      Height:          FHT:  FHR: 130 bpm, variability: moderate,  accelerations:  Present,  decelerations:  Absent UC:   irregular, every 3-7 minutes SVE: Deferred until 1915  Labs:   Recent Labs  11/13/15 1455  WBC 7.3  HGB 9.3*  HCT 30.8*  PLT 182    Assessment / Plan: Induction of labor due to preeclampsia GBS (+)  Labor: Early Preeclampsia:  no signs or symptoms of toxicity Fetal Wellbeing:  Category I Pain Control:  Labor support without medications  Plan insertion of cervical balloon with next Cytotec dose at 1915 d/t patient's intolerance with cervical exams  Anticipated MOD:  NSVD  Kenard GowerAWSON, Renaye Janicki, M, MSN, CNM 11/13/2015, 6:42 PM

## 2015-11-13 NOTE — Anesthesia Pain Management Evaluation Note (Signed)
  [facility] Pain Management Visit Note  Patient: Anna GreeningSarah Whitehead, 30 y.o., female  "Hello I am a member of the anesthesia team at [facility]. We have an anesthesia team available at all times to provide care throughout the hospital, including epidural management and anesthesia for C-section. I don't know your plan for the delivery whether it a natural birth, water birth, IV sedation, nitrous supplementation, doula or epidural, but we want to meet your pain goals."   1.Was your pain managed to your expectations on prior hospitalizations?   No prior hospitalizations  2.What is your expectation for pain management during this hospitalization?     Epidural and IV pain meds  3.How can we help you reach that goal? Not sure - IV pain medications & possible epidural.  Record the patient's initial score and the patient's pain goal.   Pain: 2  Pain Goal: 7 [facility] wants you to be able to say your pain was always managed very well.  Anna Whitehead 11/13/2015

## 2015-11-13 NOTE — H&P (Signed)
OB ADMISSION/ HISTORY & PHYSICAL:  Admission Date: 11/13/2015  1:46 PM  Admit Diagnosis: 38.1 wks PEC   Anna Whitehead is a 30 y.o. female presenting for direct admission from office for PEC.  Prenatal History: G1P0   EDC : 11/26/2015, by Last Menstrual Period  [facility] since 8.[redacted] weeks gestation Primary Care Provider at [facility]: Anna Whitehead, CNM  Prenatal course complicated by Hyperemesis Gravidarium / Anemia  / Rh Negative (spouse is Rh NEG) / GBS Positive  Prenatal Labs: ABO, Rh: O Negative (08/29 0000)  Antibody: NEG (08/29 1425) Rubella: Immune (08/29 0000)  RPR: Nonreactive (08/29 0000)  HBsAg: Negative (08/29 0000)  HIV: Non-reactive (08/29 0000)  GBS: Positive (08/29 0000)  GTT : Normal - 99 mg/dL   Medical / Surgical History :  Past medical history:  Past Medical History:  Diagnosis Date  . Allergic eczema   . Anxiety   . Depression   . Dysmenorrhea   . Iron deficiency anemia due to chronic blood loss   . Menorrhagia   . Midline low back pain with left-sided sciatica   . Mild asthma    seasonal, not used recently  . Overweight (BMI 25.0-29.9)   . Right foot pain   . Seasonal allergic rhinitis      Past surgical history:  Past Surgical History:  Procedure Laterality Date  . MYRINGOPLASTY    . NO PAST SURGERIES       Family History:  Family History  Problem Relation Age of Onset  . Bipolar disorder Mother   . Cancer Maternal Aunt     3 of her aunts had breast cancer: 2 passed away     Social History:  reports that she has never smoked. She does not have any smokeless tobacco history on file. She reports that she drinks alcohol. She reports that she does not use drugs.   Allergies: Review of patient's allergies indicates no known allergies.    Current Medications at time of admission:  Prescriptions Prior to Admission  Medication Sig Dispense Refill Last Dose  . OVER THE COUNTER MEDICATION Take 1  tablet by mouth 2 (two) times daily. Patient takes over the counter Zantac twice a day for heartburn   11/12/2015 at Unknown time  . Prenatal Vit-Fe Fumarate-FA (PRENATAL MULTIVITAMIN) TABS tablet Take 1 tablet by mouth daily at 12 noon.   11/12/2015 at Unknown time  . albuterol (VENTOLIN HFA) 108 (90 BASE) MCG/ACT inhaler Inhale into the lungs.   rescue      Review of Systems: Active FM Irregular ctxs all day   Physical Exam:  Today's Vitals   11/13/15 1407 11/13/15 1522  BP: 139/90 125/85  Pulse: 91 86  Resp: 18 18  Temp: 98.4 F (36.9 C)   TempSrc: Oral   Weight: 98 kg (216 lb)   Height: 5\' 10"  (1.778 m)     General: alert and oriented x 3, NAD Heart: RRR, no murmurs Lungs: CTAB Abdomen: Soft and non-tender, non-distended / Uterus: Gravid, non-tender Extremities: No edema Genitalia / VE: deferred / closed per office exam today FHR: 135 bpm / moderate variability / accels present / no decels TOCO: none  Labs:     Recent Labs  11/13/15 1455  WBC 7.3  HGB 9.3*  HCT 30.8*  PLT 182     Assessment:  30 y.o. G1P0 at 2670w1d  1. Labor: IOL 2. Fetal Wellbeing: Category 1  3. Pain Control: none 4. GBS: Positive  Plan:  1. Admit to BS 2. Routine L&D orders 3. Cytotec 25 mcg pv 4. Cervical Balloon     Dr. Seymour Bars notified of admission / plan of care    Kenard Gower, MSN, CNM 11/13/2015, 3:13 PM

## 2015-11-13 NOTE — Progress Notes (Signed)
Patient ID: Anna GreeningSarah Whitehead, female   DOB: [birth year], 30 y.o.   MRN: [id number] S: Doing well, occ. Contractions. Pain is still tolerable.   O: Vitals:   11/13/15 1622 11/13/15 1739 11/13/15 1832 11/13/15 1956  BP: 118/71   126/88  Pulse: 77   (!) 130  Resp: 18 18 20 18   Temp:    98.1 F (36.7 C)  TempSrc:    Oral  Weight:      Height:         FHT:  FHR: 135 bpm, variability: moderate,  accelerations:  Present,  decelerations:  Absent UC:   irregular, every 2-7 minutes SVE:   Dilation: 1 Effacement (%): 60 Station: -3 Exam by:: Ahlani Wickes,CNM  Cervical Balloon placed with mild discomfort to patient / patient tolerated well   A / P: Induction of labor due to gestational hypertension,  progressing well on cytotec GBS (+)   Fetal Wellbeing:  Category I Pain Control:  Labor support without medications  Anticipated MOD:  NSVD  Nino Amano, M 11/13/2015, 7:45 PM

## 2015-11-14 ENCOUNTER — Inpatient Hospital Stay (HOSPITAL_COMMUNITY): Payer: BC Managed Care – PPO | Admitting: Anesthesiology

## 2015-11-14 ENCOUNTER — Encounter (HOSPITAL_COMMUNITY): Payer: Self-pay | Admitting: *Deleted

## 2015-11-14 DIAGNOSIS — Z8759 Personal history of other complications of pregnancy, childbirth and the puerperium: Secondary | ICD-10-CM | POA: Diagnosis present

## 2015-11-14 LAB — CBC
HCT: 30.3 % — ABNORMAL LOW (ref 36.0–46.0)
HEMOGLOBIN: 9.2 g/dL — AB (ref 12.0–15.0)
MCH: 24.9 pg — ABNORMAL LOW (ref 26.0–34.0)
MCHC: 30.4 g/dL (ref 30.0–36.0)
MCV: 82.1 fL (ref 78.0–100.0)
Platelets: 170 10*3/uL (ref 150–400)
RBC: 3.69 MIL/uL — ABNORMAL LOW (ref 3.87–5.11)
RDW: 17.1 % — ABNORMAL HIGH (ref 11.5–15.5)
WBC: 9.4 10*3/uL (ref 4.0–10.5)

## 2015-11-14 LAB — RPR: RPR Ser Ql: NONREACTIVE

## 2015-11-14 MED ORDER — PHENYLEPHRINE 40 MCG/ML (10ML) SYRINGE FOR IV PUSH (FOR BLOOD PRESSURE SUPPORT)
80.0000 ug | PREFILLED_SYRINGE | INTRAVENOUS | Status: DC | PRN
  Filled 2015-11-14: qty 5

## 2015-11-14 MED ORDER — LIDOCAINE HCL (PF) 1 % IJ SOLN
INTRAMUSCULAR | Status: DC | PRN
  Administered 2015-11-14 (×2): 5 mL

## 2015-11-14 MED ORDER — EPHEDRINE 5 MG/ML INJ
10.0000 mg | INTRAVENOUS | Status: DC | PRN
  Filled 2015-11-14: qty 4

## 2015-11-14 MED ORDER — IBUPROFEN 600 MG PO TABS
600.0000 mg | ORAL_TABLET | Freq: Four times a day (QID) | ORAL | Status: DC
  Administered 2015-11-15 – 2015-11-16 (×6): 600 mg via ORAL
  Filled 2015-11-14 (×6): qty 1

## 2015-11-14 MED ORDER — LACTATED RINGERS IV SOLN: 500.0000 mL | Freq: Once | INTRAVENOUS | Status: DC

## 2015-11-14 MED ORDER — FLEET ENEMA 7-19 GM/118ML RE ENEM: 1.0000 | ENEMA | Freq: Every day | RECTAL | Status: DC | PRN

## 2015-11-14 MED ORDER — BISACODYL 10 MG RE SUPP: 10.0000 mg | Freq: Every day | RECTAL | Status: DC | PRN

## 2015-11-14 MED ORDER — PRENATAL MULTIVITAMIN CH
1.0000 | ORAL_TABLET | Freq: Every day | ORAL | Status: DC
  Administered 2015-11-15: 1 via ORAL
  Filled 2015-11-14: qty 1

## 2015-11-14 MED ORDER — DIPHENHYDRAMINE HCL 25 MG PO CAPS
25.0000 mg | ORAL_CAPSULE | Freq: Four times a day (QID) | ORAL | Status: DC | PRN
  Administered 2015-11-15: 25 mg via ORAL
  Filled 2015-11-14: qty 1

## 2015-11-14 MED ORDER — TETANUS-DIPHTH-ACELL PERTUSSIS 5-2.5-18.5 LF-MCG/0.5 IM SUSP: 0.5000 mL | Freq: Once | INTRAMUSCULAR | Status: DC

## 2015-11-14 MED ORDER — TERBUTALINE SULFATE 1 MG/ML IJ SOLN
0.2500 mg | Freq: Once | INTRAMUSCULAR | Status: DC | PRN
  Filled 2015-11-14: qty 1

## 2015-11-14 MED ORDER — FENTANYL 2.5 MCG/ML BUPIVACAINE 1/10 % EPIDURAL INFUSION (WH - ANES)
14.0000 mL/h | INTRAMUSCULAR | Status: DC | PRN
Start: 2015-11-14 — End: 2015-11-14

## 2015-11-14 MED ORDER — DIPHENHYDRAMINE HCL 50 MG/ML IJ SOLN: 12.5000 mg | INTRAMUSCULAR | Status: DC | PRN

## 2015-11-14 MED ORDER — ACETAMINOPHEN 325 MG PO TABS
650.0000 mg | ORAL_TABLET | ORAL | Status: DC | PRN
  Administered 2015-11-15: 650 mg via ORAL
  Filled 2015-11-14: qty 2

## 2015-11-14 MED ORDER — BENZOCAINE-MENTHOL 20-0.5 % EX AERO
1.0000 "application " | INHALATION_SPRAY | CUTANEOUS | Status: DC | PRN
  Administered 2015-11-15: 1 via TOPICAL
  Filled 2015-11-14: qty 56

## 2015-11-14 MED ORDER — POLYSACCHARIDE IRON COMPLEX 150 MG PO CAPS
150.0000 mg | ORAL_CAPSULE | Freq: Every day | ORAL | Status: DC
  Administered 2015-11-15 – 2015-11-16 (×2): 150 mg via ORAL
  Filled 2015-11-14 (×2): qty 1

## 2015-11-14 MED ORDER — ZOLPIDEM TARTRATE 5 MG PO TABS
5.0000 mg | ORAL_TABLET | Freq: Once | ORAL | Status: AC
  Administered 2015-11-14: 5 mg via ORAL
  Filled 2015-11-14: qty 1

## 2015-11-14 MED ORDER — FENTANYL 2.5 MCG/ML BUPIVACAINE 1/10 % EPIDURAL INFUSION (WH - ANES)
14.0000 mL/h | INTRAMUSCULAR | Status: DC | PRN
  Administered 2015-11-14 (×2): 14 mL/h via EPIDURAL
  Filled 2015-11-14 (×2): qty 125

## 2015-11-14 MED ORDER — RISAQUAD PO CAPS
1.0000 | ORAL_CAPSULE | Freq: Every day | ORAL | Status: DC
  Administered 2015-11-14 – 2015-11-16 (×3): 1 via ORAL
  Filled 2015-11-14 (×5): qty 1

## 2015-11-14 MED ORDER — ONDANSETRON HCL 4 MG PO TABS: 4.0000 mg | ORAL_TABLET | ORAL | Status: DC | PRN

## 2015-11-14 MED ORDER — DIBUCAINE 1 % RE OINT: 1.0000 "application " | TOPICAL_OINTMENT | RECTAL | Status: DC | PRN

## 2015-11-14 MED ORDER — SIMETHICONE 80 MG PO CHEW
80.0000 mg | CHEWABLE_TABLET | ORAL | Status: DC | PRN
  Administered 2015-11-15: 80 mg via ORAL
  Filled 2015-11-14: qty 1

## 2015-11-14 MED ORDER — PHENYLEPHRINE 40 MCG/ML (10ML) SYRINGE FOR IV PUSH (FOR BLOOD PRESSURE SUPPORT)
80.0000 ug | PREFILLED_SYRINGE | INTRAVENOUS | Status: DC | PRN
  Filled 2015-11-14: qty 10
  Filled 2015-11-14: qty 5

## 2015-11-14 MED ORDER — COCONUT OIL OIL
1.0000 "application " | TOPICAL_OIL | Status: DC | PRN
  Administered 2015-11-15: 1 via TOPICAL
  Filled 2015-11-14: qty 120

## 2015-11-14 MED ORDER — ZOLPIDEM TARTRATE 5 MG PO TABS: 5.0000 mg | ORAL_TABLET | Freq: Every evening | ORAL | Status: DC | PRN

## 2015-11-14 MED ORDER — OXYTOCIN 40 UNITS IN LACTATED RINGERS INFUSION - SIMPLE MED
1.0000 m[IU]/min | INTRAVENOUS | Status: DC
  Administered 2015-11-14: 2 m[IU]/min via INTRAVENOUS
  Filled 2015-11-14: qty 1000

## 2015-11-14 MED ORDER — ONDANSETRON HCL 4 MG/2ML IJ SOLN: 4.0000 mg | INTRAMUSCULAR | Status: DC | PRN

## 2015-11-14 MED ORDER — WITCH HAZEL-GLYCERIN EX PADS: 1.0000 "application " | MEDICATED_PAD | CUTANEOUS | Status: DC | PRN

## 2015-11-14 MED ORDER — SENNOSIDES-DOCUSATE SODIUM 8.6-50 MG PO TABS
2.0000 | ORAL_TABLET | ORAL | Status: DC
  Administered 2015-11-15 – 2015-11-16 (×2): 2 via ORAL
  Filled 2015-11-14 (×2): qty 2

## 2015-11-14 NOTE — Progress Notes (Addendum)
Patient ID: Anna GreeningSarah Chalfant, female   DOB: 11-30-1985, 30 y.o.   MRN: [id number] S: Doing well, "wasn't able to sleep that long"; would like to rest a while before starting Pitocin. Also wants to eat something before starting Pitocin, because vomited all of dinner.   O: Vitals:   11/14/15 0101 11/14/15 0131 11/14/15 0141 11/14/15 0258  BP: 134/79 132/76 127/80 122/82  Pulse: (!) 43 65 67 (!) 51  Resp:    16  Temp:   98.2 F (36.8 C)   TempSrc:   Oral   Weight:      Height:         FHT:  FHR: 120 bpm, variability: moderate,  accelerations:  Present,  decelerations:  Absent UC:   irregular, every 4-5 minutes SVE:   Dilation: 4.5 Effacement (%): 60 Station: -3 Exam by:: Rebekah Harker,RN Foley balloon fell out ~ 0600 per pt  A / P: Induction of labor due to gestational hypertension,  progressing well with cytotec and foley balloon  Fetal Wellbeing:  Category I Pain Control:  IV pain meds Light laboring diet for breakfast Start Pitocin at 0900 Anticipated MOD:  NSVD  Kenard GowerAWSON, Arwa Yero, M, MSN, CNM 11/14/2015, 7:00 AM

## 2015-11-14 NOTE — Progress Notes (Signed)
Patient ID: Anna GreeningSarah Friedl, female   DOB: 1985-04-17, 30 y.o.   MRN: [id number] S: Doing well. Concerned Foley balloon has not fallen out.   O: Vitals:   11/14/15 0041 11/14/15 0101 11/14/15 0131 11/14/15 0141  BP:  134/79 132/76 127/80  Pulse:  (!) 43 65 67  Resp:      Temp: 98.2 F (36.8 C)   98.2 F (36.8 C)  TempSrc: Oral   Oral  Weight:      Height:         FHT:  FHR: 115 bpm, variability: moderate,  accelerations:  Present,  decelerations:  Absent UC:   irregular SVE:   Dilation: 1 Effacement (%): 60 Station: -3 Exam by:: Rolita Lorretta Kerce,CNM Foley Balloon in place  A / P: Induction of labor due to gestational hypertension,  progressing well on pitocin GBS (+)  Fetal Wellbeing:  Category I Pain Control:  IV pain meds  Anticipated MOD:  NSVD  *Reassured patient and spouse that foley balloon not falling out is normal / reviewed plan to keep continued traction on foley balloon.  Kenard GowerAWSON, Symphani Eckstrom, M, MSN, CNM 11/14/2015, 2:47 AM

## 2015-11-14 NOTE — Progress Notes (Signed)
S: Doing well, pain controlled with  Epidural, no pelvic pressure, denies PEC s/s.    O: Vitals:   11/14/15 1411 11/14/15 1415 11/14/15 1416 11/14/15 1421  BP: 122/80  107/70   Pulse: (!) 50 95 97   Resp: 16  16 16   Temp:    97.8 F (36.6 C)  TempSrc:    Oral  SpO2:      Weight:      Height:       Pitocin 10 mu/min  FHT:  FHR: 140 bpm, variability: moderate,  accelerations:  Present,  decelerations:  Absent UC:   regular, every 2-3 minutes SVE:   Dilation: 7 Effacement (%): 80 Station: -2 Exam by:: Colon Flattery. Nyko Gell, CNM  BBOW --> AROM, clear fluid, + show   A / P: Induction of labor due to gestational hypertension,  progressing well on pitocin, no s/s PEC, normotensive  -encourage position changes q 30 min  -foley cath to maintain empty bladder. Fetal Wellbeing:  Category I Pain Control:  Labor support without medications and Epidural I/D: GBS prophylaxis ongoing, s/p 2 doses PCN Anticipated MOD:  NSVD  Neta Mendsaniela C Ruberta Holck, CNM, MSN 11/14/2015, 2:37 PM

## 2015-11-14 NOTE — Anesthesia Procedure Notes (Signed)
Epidural Patient location during procedure: OB Start time: 11/14/2015 1:41 PM End time: 11/14/2015 1:45 PM  Staffing Anesthesiologist: Bonita QuinGUIDETTI, Anna Vanegas S Performed: anesthesiologist   Preanesthetic Checklist Completed: patient identified, site marked, surgical consent, pre-op evaluation, timeout performed, IV checked, risks and benefits discussed and monitors and equipment checked  Epidural Patient position: sitting Prep: site prepped and draped and DuraPrep Patient monitoring: continuous pulse ox and blood pressure Approach: midline Location: L4-L5 Injection technique: LOR air  Needle:  Needle type: Tuohy  Needle gauge: 17 G Needle length: 9 cm and 9 Needle insertion depth: 5 cm cm Catheter type: closed end flexible Catheter size: 19 Gauge Catheter at skin depth: 10 cm Test dose: negative  Assessment Events: blood not aspirated, injection not painful, no injection resistance, negative IV test and no paresthesia

## 2015-11-14 NOTE — Progress Notes (Signed)
S: [facility] and off[facility] comfortable w/ [facility]. Denies PEC s/s.   O: VSSAF FHR 130, mod var, + accels, early decels Ctx q 2-3 min SVE 7-8/90/-1 Clear AF  Foley cath to gravity  Pitocin at 14 mu/min  A/P: IOL 2/2 gest HTN, progressing well on pitocin FHT cat 1 Gest HTN - stable, normotensive Pain - epidural effective I/D: GBS prophylaxis throughout active labor Anticipate NSVB  Continue changing positions, facilitate vertex descent  5:16 PM 11/14/2015  Anna Whitehead, CNM

## 2015-11-14 NOTE — Anesthesia Pain Management Evaluation Note (Signed)
  CRNA Pain Management Visit Note  Patient: Anna GreeningSarah Whitehead, 30 y.o., female  "Hello I am a [facility] of the [facility]. We have an anesthesia team available at all times to provide care throughout the hospital, including epidural management and anesthesia for C-section. I don't know your plan for the delivery whether it a natural birth, water birth, IV sedation, nitrous supplementation, doula or epidural, but we want to meet your pain goals."   1.Was your pain managed to your expectations on prior hospitalizations?   No prior hospitalizations  2.What is your expectation for pain management during this hospitalization?     IV pain meds and Nitrous Oxide until epidural placement  3.How can we help you reach that goal? Comfort measures, support. Patient comfortable  Record the patient's initial score and the patient's pain goal.   Pain: 4  Pain Goal: 4 [facility] wants you to be able to say your pain was always managed very well.  [facility],[name] 11/14/2015

## 2015-11-14 NOTE — Progress Notes (Addendum)
Anna GreeningSarah Whitehead is a 30 y.o. G1P0 at 7679w2d by LMP admitted for induction of labor due to Hypertension.  Subjective: Sitting on labor bag, had shower, reports ctx tolerable. Has showered this am and had breakfast, heartburn relieved since Pepcid IV last night. Denies HA/NV/RUQ pain/visual changes. Cervical balloon expelled at 0600, cervical ripening effective.  [facility] spouse Para [facility] and [employer]. In good spirits.  Objective: Vitals:   11/14/15 0101 11/14/15 0131 11/14/15 0141 11/14/15 0258  BP: 134/79 132/76 127/80 122/82  Pulse: (!) 43 65 67 (!) 51  Resp:    16  Temp:   98.2 F (36.8 C)   TempSrc:   Oral   Weight:      Height:        No intake/output data recorded. No intake/output data recorded.   FHT:  FHR: 140 bpm, variability: moderate,  accelerations:  Present,  decelerations:  Absent UC:   regular, every 2-3 minutes, palp mild SVE:   Dilation: 5.5 Effacement (%): 60 Station: -2, -1 Exam by:: Daniella CNM BBOW, membranes striped  Labs:   Recent Labs  11/13/15 1455  WBC 7.3  HGB 9.3*  HCT 30.8*  PLT 182    Assessment / Plan: G1 at term Induction of labor due to gestational hypertension  Labor: Cervical ripening overnight, plan pitocin augmentation, titrate to strong, regular ctx. Preeclampsia:  labs stable and mostly normotensive, occasional mild range BP, no neural s/s Fetal Wellbeing:  Category I Pain Control:  Labor support without medications and plan Nitrous --> epidurla in active labor. Patient has dysfunctional coping with pain, hard to relax w/ vaginal exams. I/D:  GBS prophylaxis, will start with Pitocin augmentation. Anticipated MOD:  NSVD  Neta Mendsaniela C Joan Avetisyan, CNM, MSN 11/14/2015, 9:23 AM

## 2015-11-14 NOTE — Anesthesia Preprocedure Evaluation (Signed)
Anesthesia Evaluation  Patient identified by MRN, date of birth, ID band Patient awake    Reviewed: Allergy & Precautions, NPO status , Patient's Chart, lab work & pertinent test results  Airway Mallampati: II  TM Distance: >3 FB Neck ROM: Full    Dental no notable dental hx.    Pulmonary asthma ,    Pulmonary exam normal        Cardiovascular negative cardio ROS Normal cardiovascular exam  Hypertension during this pregnancy   Neuro/Psych    GI/Hepatic negative GI ROS, Neg liver ROS,   Endo/Other  negative endocrine ROS  Renal/GU negative Renal ROS     Musculoskeletal   Abdominal   Peds  Hematology   Anesthesia Other Findings   Reproductive/Obstetrics (+) Pregnancy                             Anesthesia Physical Anesthesia Plan  ASA: II  Anesthesia Plan: Epidural   Post-op Pain Management:    Induction:   Airway Management Planned:   Additional Equipment:   Intra-op Plan:   Post-operative Plan:   Informed Consent:   Plan Discussed with:   Anesthesia Plan Comments:         Anesthesia Quick Evaluation

## 2015-11-15 LAB — CBC
HEMATOCRIT: 28.3 % — AB (ref 36.0–46.0)
HEMOGLOBIN: 8.5 g/dL — AB (ref 12.0–15.0)
MCH: 24.5 pg — AB (ref 26.0–34.0)
MCHC: 30 g/dL (ref 30.0–36.0)
MCV: 81.6 fL (ref 78.0–100.0)
Platelets: 153 10*3/uL (ref 150–400)
RBC: 3.47 MIL/uL — AB (ref 3.87–5.11)
RDW: 17.3 % — ABNORMAL HIGH (ref 11.5–15.5)
WBC: 13.8 10*3/uL — ABNORMAL HIGH (ref 4.0–10.5)

## 2015-11-15 LAB — CCBB MATERNAL DONOR DRAW

## 2015-11-15 NOTE — Progress Notes (Signed)
LCSW attempted to follow up a second time to assess MOB and offers services, but MOB working with provider and lactation.  Will attempt in the AM. No barriers to DC at this time. Will follow up.  Araf Clugston LCSW, MSW Clinical Social Work: System Wide Float Coverage for [facility] Clinical social worker [phone] 

## 2015-11-15 NOTE — Progress Notes (Signed)
Assisted pt up to bathroom to void, gait steady. Pt able to void without difficulty. Peri care taught. Pt tolerated ambulation without difficulty.

## 2015-11-15 NOTE — Progress Notes (Signed)
CSW acknowledged the consult and attempted to meet with MOB. When [facility] arrived [facility] CSW will attempt to meet with MOB at a later time.   Blaine HamperAngel Boyd-Gilyard, MSW, LCSW Clinical Social Work [phone]

## 2015-11-15 NOTE — Lactation Note (Signed)
This note was copied from a baby's chart. Lactation Consultation Note  Patient Name: Boy Annell GreeningSarah Dicicco WUJWJ'XToday's Date: 11/15/2015 Reason for consult: Initial assessment Mom concerned that baby is not latching. Basic teaching reviewed with parents. Mom's nipples have very short shaft, almost flat, center has dimpling. Reviewed with Mom how to use hand pump to pre-pump and attempted at this visit to latch baby using breast compression but baby could not obtain/sustain any depth with latch. Baby demonstrated good strong suck in LC finger. Initiated 16 nipple shield but changed to 20 for better fit. Scant amount of colostrum visible in nipple shield after baby BF on right nipple. Gave Mom breast shells to wear. Encouraged Mom to BF with feeding ques, pre-pump with hand pump to help with latch, use #20 nipple shield if baby not able to sustain the latch. Wear breast shells between feedings. Lactation brochure left for review, advised of OP services and support group. RN to set up DEBP for Mom to post pump after BF for 15 minutes if continues to need nipple shield to latch.  Encouraged to call for assist with feedings.   Maternal Data Has patient been taught Hand Expression?: Yes Does the patient have breastfeeding experience prior to this delivery?: No  Feeding Feeding Type: Breast Fed  LATCH Score/Interventions Latch: Repeated attempts needed to sustain latch, nipple held in mouth throughout feeding, stimulation needed to elicit sucking reflex. (initiated #20 nipple shield) Intervention(s): Adjust position;Assist with latch;Breast massage;Breast compression  Audible Swallowing: A few with stimulation  Type of Nipple: Flat (dimpling center of nipple) Intervention(s): Shells;Hand pump  Comfort (Breast/Nipple): Soft / non-tender     Hold (Positioning): Assistance needed to correctly position infant at breast and maintain latch. Intervention(s): Breastfeeding basics reviewed;Support  Pillows;Position options;Skin to skin  LATCH Score: 6  Lactation Tools Discussed/Used Tools: Nipple Dorris CarnesShields;Shells;Pump Nipple shield size: 20;16 Shell Type: Inverted Breast pump type: Manual WIC Program: No   Consult Status Consult Status: Follow-up Date: 11/16/15 Follow-up type: In-patient    Alfred LevinsGranger, Shifa Brisbon Ann 11/15/2015, 4:08 PM

## 2015-11-15 NOTE — Progress Notes (Signed)
PPD # 1 SVD Information for the patient's newborn:  Lucas MallowMcLaurin, Boy Keir [[id number]][[id number]]  female    Breast feeding  / Circumcision declined Baby name: Hinda LenisLachlan Knight  S:  Reports feeling tired but ok. Has not been able to latch baby yet to breast.             Tolerating po/ No nausea or vomiting             Bleeding is decreasing.             Pain controlled with ibuprofen (OTC)             Up ad lib / ambulatory / voiding without difficulties        O:  A & O x 3, in no apparent distress              VS:  Vitals:   11/14/15 2216 11/14/15 2240 11/14/15 2330 11/15/15 0430  BP: (!) 144/95 (!) 141/89 131/83 130/74  Pulse: 83 71 68 (!) 54  Resp:   18 18  Temp: 97.9 F (36.6 C)  98.6 F (37 C) 98.1 F (36.7 C)  TempSrc: Oral  Oral Oral  SpO2:      Weight:      Height:        LABS:  Recent Labs  11/14/15 1308 11/15/15 0523  WBC 9.4 13.8*  HGB 9.2* 8.5*  HCT 30.3* 28.3*  PLT 170 153    Blood type: --/--/O NEG, O NEG (08/29 1425)  Rubella: Immune (08/29 0000)   I&O: I/O last 3 completed shifts: In: -  Out: 900 [Urine:600; Blood:300]          No intake/output data recorded.  Lungs: Clear and unlabored  Heart: regular rate and rhythm / no murmurs  Abdomen: soft, non-tender, non-distended             Fundus: firm, non-tender, U@  Perineum: repair intact, moderate edema, ice pack on perineum  Lochia: small  Extremities: trace edema, no calf pain or tenderness    A/P: PPD # 1 30 y.o., G1P1001   Principal Problem:   Postpartum care following vaginal delivery (8/30) Active Problems:   Gestational hypertension RH negative - newborn Rh neg. Iron deficiency anemia of pregnancy with superimposed acute blood loss anemia   Doing well - stable status  LC consult for latch  Oral Fe supplementation  Routine post partum orders  Anticipate discharge tomorrow    Neta Mendsaniela C Paul, MSN, CNM 11/15/2015, 9:58 AM

## 2015-11-15 NOTE — Anesthesia Postprocedure Evaluation (Signed)
Anesthesia Post Note  Patient: Anna GreeningSarah Isidro  Procedure(s) Performed: * No procedures listed *  Patient location during evaluation: Mother Baby Anesthesia Type: Epidural Level of consciousness: awake and alert Pain management: pain level controlled Vital Signs Assessment: post-procedure vital signs reviewed and stable Respiratory status: spontaneous breathing, nonlabored ventilation and respiratory function stable Cardiovascular status: stable Postop Assessment: no headache, no backache and epidural receding Anesthetic complications: no     Last Vitals:  Vitals:   11/14/15 2330 11/15/15 0430  BP: 131/83 130/74  Pulse: 68 (!) 54  Resp: 18 18  Temp: 37 C 36.7 C    Last Pain:  Vitals:   11/15/15 0430  TempSrc: Oral  PainSc: 3    Pain Goal: Patients Stated Pain Goal: 1 (11/14/15 2330)               Junious SilkGILBERT,Davarious Tumbleson

## 2015-11-16 ENCOUNTER — Ambulatory Visit: Payer: Self-pay

## 2015-11-16 MED ORDER — FLUOXETINE HCL 20 MG PO CAPS: 20.0000 mg | ORAL_CAPSULE | Freq: Every day | ORAL | 3 refills | Status: DC

## 2015-11-16 MED ORDER — POLYSACCHARIDE IRON COMPLEX 150 MG PO CAPS: 150.0000 mg | ORAL_CAPSULE | Freq: Every day | ORAL | 3 refills | Status: DC

## 2015-11-16 MED ORDER — IBUPROFEN 600 MG PO TABS: 600.0000 mg | ORAL_TABLET | Freq: Four times a day (QID) | ORAL | 0 refills | Status: DC

## 2015-11-16 NOTE — Progress Notes (Addendum)
Patient ID: Anna Whitehead, female   DOB: [birth year], 30 y.o.   MRN: [id number] Post Partum Day #2            Information for the patient's newborn:  Anna Whitehead, Boy Anna Whitehead [[id number]][[id number]]  female   / circumcision NOT planning Feeding: breast  Subjective: No HA, SOB, CP, F/C, breast symptoms. Pain well-controlled with ibuprofen. Normal vaginal bleeding, no clots.      Objective:  Temp:  [97.9 F (36.6 C)-98.4 F (36.9 C)] 97.9 F (36.6 C) (09/01 0540) Pulse Rate:  [52-83] 83 (09/01 0540) Resp:  [18] 18 (09/01 0540) BP: (126-127)/(73-82) 126/82 (09/01 0540)    Recent Labs  11/14/15 1308 11/15/15 0523  WBC 9.4 13.8*  HGB 9.2* 8.5*  HCT 30.3* 28.3*  PLT 170 153    Blood type: O NEG (08/29 1425) / Infant Rh NEG / NO Rhophylac indicated Rubella: Immune (08/29 0000)    Physical Exam:  General: alert, cooperative and no distress Uterine Fundus: firm Lochia: appropriate Perineum: 2nd degree repair healing well, edema none DVT Evaluation: No evidence of DVT seen on physical exam. Negative Homan's sign. No cords or calf tenderness. No significant calf/ankle edema.   Assessment/Plan: PPD # 2 / 30 y.o., G1P1001 S/P: induced vaginal   Principal Problem:   Postpartum care following vaginal delivery (8/30) Active Problems:   Gestational hypertension Anxiety and Depression   normal postpartum exam  Continue current postpartum care  Start Prozac 20 mg daily  D/C home  F/U with [facility] in 2 weeks for postpartum mood disorder evaluation  F/U with [facility] in 6 weeks for postpartum visit   LOS: 3 days   Anna Whitehead, Anna Whitehead, M, MSN, CNM 11/16/2015, 10:05 AM

## 2015-11-16 NOTE — Discharge Instructions (Signed)
Breast Pumping Tips °If you are breastfeeding, there may be times when you cannot feed your baby directly. Returning to work or going on a trip are common examples. Pumping allows you to store breast milk and feed it to your baby later.  °You may not get much milk when you first start to pump. Your breasts should start to make more after a few days. If you pump at the times you usually feed your baby, you may be able to keep making enough milk to feed your baby without also using formula. The more often you pump, the more milk you will produce.  °WHEN SHOULD I PUMP?  °· You can begin to pump soon after delivery. However, some experts recommend waiting about 4 weeks before giving your infant a bottle to make sure breastfeeding is going well.  °· If you plan to return to work, begin pumping a few weeks before. This will help you develop techniques that work best for you. It also lets you build up a supply of breast milk.   °· When you are with your infant, feed on demand and pump after each feeding.   °· When you are away from your infant for several hours, pump for about 15 minutes every 2-3 hours. Pump both breasts at the same time if you can.   °· If your infant has a formula feeding, make sure to pump around the same time.     °· If you drink any alcohol, wait 2 hours before pumping.   °HOW DO I PREPARE TO PUMP? °Your let-down reflex is the natural reaction to stimulation that makes your breast milk flow. It is easier to stimulate this reflex when you are relaxed. Find relaxation techniques that work for you. If you have difficulty with your let-down reflex, try these methods:  °· Smell one of your infant's blankets or an item of clothing.   °· Look at a picture or video of your infant.   °· Sit in a quiet, private space.   °· Massage the breast you plan to pump.   °· Place soothing warmth on the breast.   °· Play relaxing music.   °WHAT ARE SOME GENERAL BREAST PUMPING TIPS? °· Wash your hands before you pump. You  do not need to wash your nipples or breasts. °· There are three ways to pump. °¨ You can use your hand to massage and compress your breast. °¨ You can use a handheld manual pump. °¨ You can use an electric pump.   °· Make sure the suction cup (flange) on the breast pump is the right size. Place the flange directly over the nipple. If it is the wrong size or placed the wrong way, it may be painful and cause nipple damage.   °· If pumping is uncomfortable, apply a small amount of purified or modified lanolin to your nipple and areola. °· If you are using an electric pump, adjust the speed and suction power to be more comfortable. °· If pumping is painful or if you are not getting very much milk, you may need a different type of pump. A lactation consultant can help you determine what type of pump to use.   °· Keep a full water bottle near you at all times. Drinking lots of fluid helps you make more milk.  °· You can store your milk to use later. Pumped breast milk can be stored in a sealable, sterile container or plastic bag. Label all stored breast milk with the date you pumped it. °¨ Milk can stay out at room temperature for up to 8 hours. °¨   You can store your milk in the refrigerator for up to 8 days. °¨ You can store your milk in the freezer for 3 months. Thaw frozen milk using warm water. Do not put it in the microwave. °· Do not smoke. Smoking can lower your milk supply and harm your infant. If you need help quitting, ask your health care provider to recommend a program.   °WHEN SHOULD I CALL MY HEALTH CARE PROVIDER OR A LACTATION CONSULTANT? °· You are having trouble pumping. °· You are concerned that you are not making enough milk. °· You have nipple pain, soreness, or redness. °· You want to use birth control. Birth control pills may lower your milk supply. Talk to your health care provider about your options. °  °This information is not intended to replace advice given to you by your health care provider.  Make sure you discuss any questions you have with your health care provider. °  °Document Released: 08/21/2009 Document Revised: 03/08/2013 Document Reviewed: 12/24/2012 °[employer]vier Interactive Patient Education ©2016 [employer] °Postpartum Depression and Baby Blues °The postpartum period begins right after the birth of a baby. During this time, there is often a great amount of joy and excitement. It is also a time of many changes in the life of the parents. Regardless of how many times a mother gives birth, each child brings new challenges and dynamics to the family. It is not unusual to have feelings of excitement along with confusing shifts in moods, emotions, and thoughts. All mothers are at risk of developing postpartum depression or the "baby blues." These mood changes can occur right after giving birth, or they may occur many months after giving birth. The baby blues or postpartum depression can be mild or severe. Additionally, postpartum depression can go away rather quickly, or it can be a long-term condition.  °CAUSES °Raised hormone levels and the rapid drop in those levels are thought to be a main cause of postpartum depression and the baby blues. A number of hormones change during and after pregnancy. Estrogen and progesterone usually decrease right after the delivery of your baby. The levels of thyroid hormone and various cortisol steroids also rapidly drop. Other factors that play a role in these mood changes include major life events and genetics.  °RISK FACTORS °If you have any of the following risks for the baby blues or postpartum depression, know what symptoms to watch out for during the postpartum period. Risk factors that may increase the likelihood of getting the baby blues or postpartum depression include: °· Having a personal or family history of depression.   °· Having depression while being pregnant.   °· Having premenstrual mood issues or mood issues related to oral  contraceptives. °· Having a lot of life stress.   °· Having marital conflict.   °· Lacking a social support network.   °· Having a baby with special needs.   °· Having health problems, such as diabetes.   °SIGNS AND SYMPTOMS °Symptoms of baby blues include: °· Brief changes in mood, such as going from extreme happiness to sadness. °· Decreased concentration.   °· Difficulty sleeping.   °· Crying spells, tearfulness.   °· Irritability.   °· Anxiety.   °Symptoms of postpartum depression typically begin within the first month after giving birth. These symptoms include: °· Difficulty sleeping or excessive sleepiness.   °· Marked weight loss.   °· Agitation.   °· Feelings of worthlessness.   °· Lack of interest in activity or food.   °Postpartum psychosis is a very serious condition and can be dangerous. Fortunately, it is   rare. Displaying any of the following symptoms is cause for immediate medical attention. Symptoms of postpartum psychosis include:  °· Hallucinations and delusions.   °· Bizarre or disorganized behavior.   °· Confusion or disorientation.   °DIAGNOSIS  °A diagnosis is made by an evaluation of your symptoms. There are no medical or lab tests that lead to a diagnosis, but there are various questionnaires that a health care provider may use to identify those with the baby blues, postpartum depression, or psychosis. Often, a screening tool called the Edinburgh Postnatal Depression Scale is used to diagnose depression in the postpartum period.  °TREATMENT °The baby blues usually goes away on its own in 1-2 weeks. Social support is often all that is needed. You will be encouraged to get adequate sleep and rest. Occasionally, you may be given medicines to help you sleep.  °Postpartum depression requires treatment because it can last several months or longer if it is not treated. Treatment may include individual or group therapy, medicine, or both to address any social, physiological, and psychological factors  that may play a role in the depression. Regular exercise, a healthy diet, rest, and social support may also be strongly recommended.  °Postpartum psychosis is more serious and needs treatment right away. Hospitalization is often needed. °HOME CARE INSTRUCTIONS °· Get as much rest as you can. Nap when the baby sleeps.   °· Exercise regularly. Some women find yoga and walking to be beneficial.   °· Eat a balanced and nourishing diet.   °· Do little things that you enjoy. Have a cup of tea, take a bubble bath, read your favorite magazine, or listen to your favorite music. °· Avoid alcohol.   °· Ask for help with household chores, cooking, grocery shopping, or running errands as needed. Do not try to do everything.   °· Talk to people close to you about how you are feeling. Get support from your partner, family members, friends, or other new moms. °· Try to stay positive in how you think. Think about the things you are grateful for.   °· Do not spend a lot of time alone.   °· Only take over-the-counter or prescription medicine as directed by your health care provider. °· Keep all your postpartum appointments.   °· Let your health care provider know if you have any concerns.   °SEEK MEDICAL CARE IF: °You are having a reaction to or problems with your medicine. °SEEK IMMEDIATE MEDICAL CARE IF: °· You have suicidal feelings.   °· You think you may harm the baby or someone else. °MAKE SURE YOU: °· Understand these instructions. °· Will watch your condition. °· Will get help right away if you are not doing well or get worse. °  °This information is not intended to replace advice given to you by your health care provider. Make sure you discuss any questions you have with your health care provider. °  °Document Released: 12/06/2003 Document Revised: 03/08/2013 Document Reviewed: 12/13/2012 °Elsevier Interactive Patient Education ©2016 [employer] °Postpartum Care After Vaginal Delivery °After you deliver your newborn  (postpartum period), the usual stay in the hospital is 24-72 hours. If there were problems with your labor or delivery, or if you have other medical problems, you might be in the hospital longer.  °While you are in the hospital, you will receive help and instructions on how to care for yourself and your newborn during the postpartum period.  °While you are in the hospital: °· Be sure to tell your nurses if you have pain or discomfort, as well as   where you feel the pain and what makes the pain worse. °· If you had an incision made near your vagina (episiotomy) or if you had some tearing during delivery, the nurses may put ice packs on your episiotomy or tear. The ice packs may help to reduce the pain and swelling. °· If you are breastfeeding, you may feel uncomfortable contractions of your uterus for a couple of weeks. This is normal. The contractions help your uterus get back to normal size. °· It is normal to have some bleeding after delivery. °¨ For the first 1-3 days after delivery, the flow is red and the amount may be similar to a period. °¨ It is common for the flow to start and stop. °¨ In the first few days, you may pass some small clots. Let your nurses know if you begin to pass large clots or your flow increases. °¨ Do not  flush blood clots down the toilet before having the nurse look at them. °¨ During the next 3-10 days after delivery, your flow should become more watery and pink or brown-tinged in color. °¨ Ten to fourteen days after delivery, your flow should be a small amount of yellowish-white discharge. °¨ The amount of your flow will decrease over the first few weeks after delivery. Your flow may stop in 6-8 weeks. Most women have had their flow stop by 12 weeks after delivery. °· You should change your sanitary pads frequently. °· Wash your hands thoroughly with soap and water for at least 20 seconds after changing pads, using the toilet, or before holding or feeding your newborn. °· You should  feel like you need to empty your bladder within the first 6-8 hours after delivery. °· In case you become weak, lightheaded, or faint, call your nurse before you get out of bed for the first time and before you take a shower for the first time. °· Within the first few days after delivery, your breasts may begin to feel tender and full. This is called engorgement. Breast tenderness usually goes away within 48-72 hours after engorgement occurs. You may also notice milk leaking from your breasts. If you are not breastfeeding, do not stimulate your breasts. Breast stimulation can make your breasts produce more milk. °· Spending as much time as possible with your newborn is very important. During this time, you and your newborn can feel close and get to know each other. Having your newborn stay in your room (rooming in) will help to strengthen the bond with your newborn.  It will give you time to get to know your newborn and become comfortable caring for your newborn. °· Your hormones change after delivery. Sometimes the hormone changes can temporarily cause you to feel sad or tearful. These feelings should not last more than a few days. If these feelings last longer than that, you should talk to your caregiver. °· If desired, talk to your caregiver about methods of family planning or contraception. °· Talk to your caregiver about immunizations. Your caregiver may want you to have the following immunizations before leaving the hospital: °¨ Tetanus, diphtheria, and pertussis (Tdap) or tetanus and diphtheria (Td) immunization. It is very important that you and your family (including grandparents) or others caring for your newborn are up-to-date with the Tdap or Td immunizations. The Tdap or Td immunization can help protect your newborn from getting ill. °¨ Rubella immunization. °¨ Varicella (chickenpox) immunization. °¨ Influenza immunization. You should receive this annual immunization if you did not receive the    immunization during your pregnancy. °  °This information is not intended to replace advice given to you by your health care provider. Make sure you discuss any questions you have with your health care provider. °  °Document Released: 12/29/2006 Document Revised: 11/26/2011 Document Reviewed: 10/29/2011 °[employer]vier Interactive Patient Education ©2016 [employer] °Breastfeeding and Mastitis °Mastitis is inflammation of the breast tissue. It can occur in women who are breastfeeding. This can make breastfeeding painful. Mastitis will sometimes go away on its own. Your health care provider will help determine if treatment is needed. °CAUSES °Mastitis is often associated with a blocked milk (lactiferous) duct. This can happen when too much milk builds up in the breast. Causes of excess milk in the breast can include: °· Poor latch-on. If your baby is not latched onto the breast properly, she or he may not empty your breast completely while breastfeeding. °· Allowing too much time to pass between feedings. °· Wearing a bra or other clothing that is too tight. This puts extra pressure on the lactiferous ducts so milk does not flow through them as it should. °Mastitis can also be caused by a bacterial infection. Bacteria may enter the breast tissue through cuts or openings in the skin. In women who are breastfeeding, this may occur because of cracked or irritated skin. Cracks in the skin are often caused when your baby does not latch on properly to the breast. °SIGNS AND SYMPTOMS °· Swelling, redness, tenderness, and pain in an area of the breast. °· Swelling of the glands under the arm on the same side. °· Fever may or may not accompany mastitis. °If an infection is allowed to progress, a collection of pus (abscess) may develop. °DIAGNOSIS  °Your health care provider can usually diagnose mastitis based on your symptoms and a physical exam. Tests may be done to help confirm the diagnosis. These may include: °· Removal of pus  from the breast by applying pressure to the area. This pus can be examined in the lab to determine which bacteria are present. If an abscess has developed, the fluid in the abscess can be removed with a needle. This can also be used to confirm the diagnosis and determine the bacteria present. In most cases, pus will not be present. °· Blood tests to determine if your body is fighting a bacterial infection. °· Mammogram or ultrasound tests to rule out other problems or diseases. °TREATMENT  °Mastitis that occurs with breastfeeding will sometimes go away on its own. Your health care provider may choose to wait 24 hours after first seeing you to decide whether a prescription medicine is needed. If your symptoms are worse after 24 hours, your health care provider will likely prescribe an antibiotic medicine to treat the mastitis. He or she will determine which bacteria are most likely causing the infection and will then select an appropriate antibiotic medicine. This is sometimes changed based on the results of tests performed to identify the bacteria, or if there is no response to the antibiotic medicine selected. Antibiotic medicines are usually given by mouth. You may also be given medicine for pain. °HOME CARE INSTRUCTIONS °· Only take over-the-counter or prescription medicines for pain, fever, or discomfort as directed by your health care provider. °· If your health care provider prescribed an antibiotic medicine, take the medicine as directed. Make sure you finish it even if you start to feel better. °· Do not wear a tight or underwire bra. Wear a soft, supportive bra. °· Increase your fluid   intake, especially if you have a fever. °· Continue to empty the breast. Your health care provider can tell you whether this milk is safe for your infant or needs to be thrown out. You may be told to stop nursing until your health care provider thinks it is safe for your baby. Use a breast pump if you are advised to stop  nursing. °· Keep your nipples clean and dry. °· Empty the first breast completely before going to the other breast. If your baby is not emptying your breasts completely for some reason, use a breast pump to empty your breasts. °· If you go back to work, pump your breasts while at work to stay in time with your nursing schedule. °· Avoid allowing your breasts to become overly filled with milk (engorged). °SEEK MEDICAL CARE IF: °· You have pus-like discharge from the breast. °· Your symptoms do not improve with the treatment prescribed by your health care provider within 2 days. °SEEK IMMEDIATE MEDICAL CARE IF: °· Your pain and swelling are getting worse. °· You have pain that is not controlled with medicine. °· You have a red line extending from the breast toward your armpit. °· You have a fever or persistent symptoms for more than 2-3 days. °· You have a fever and your symptoms suddenly get worse. °MAKE SURE YOU:  °· Understand these instructions. °· Will watch your condition. °· Will get help right away if you are not doing well or get worse. °  °This information is not intended to replace advice given to you by your health care provider. Make sure you discuss any questions you have with your health care provider. °  °Document Released: 06/28/2004 Document Revised: 03/08/2013 Document Reviewed: 10/07/2012 °[employer]vier Interactive Patient Education ©2016 [employer] ° °Breastfeeding °Deciding to breastfeed is one of the best choices you can make for you and your baby. A change in hormones during pregnancy causes your breast tissue to grow and increases the number and size of your milk ducts. These hormones also allow proteins, sugars, and fats from your blood supply to make breast milk in your milk-producing glands. Hormones prevent breast milk from being released before your baby is born as well as prompt milk flow after birth. Once breastfeeding has begun, thoughts of your baby, as well as his or her sucking or  crying, can stimulate the release of milk from your milk-producing glands.  °BENEFITS OF BREASTFEEDING °For Your Baby °· Your first milk (colostrum) helps your baby's digestive system function better. °· There are antibodies in your milk that help your baby fight off infections. °· Your baby has a lower incidence of asthma, allergies, and sudden infant death syndrome. °· The nutrients in breast milk are better for your baby than infant formulas and are designed uniquely for your baby's needs. °· Breast milk improves your baby's brain development. °· Your baby is less likely to develop other conditions, such as childhood obesity, asthma, or type 2 diabetes mellitus. °For You °· Breastfeeding helps to create a very special bond between you and your baby. °· Breastfeeding is convenient. Breast milk is always available at the correct temperature and costs nothing. °· Breastfeeding helps to burn calories and helps you lose the weight gained during pregnancy. °· Breastfeeding makes your uterus contract to its prepregnancy size faster and slows bleeding (lochia) after you give birth.   °· Breastfeeding helps to lower your risk of developing type 2 diabetes mellitus, osteoporosis, and breast or ovarian cancer later in life. °  SIGNS THAT YOUR BABY IS HUNGRY °Early Signs of Hunger °· Increased alertness or activity. °· Stretching. °· Movement of the head from side to side. °· Movement of the head and opening of the mouth when the corner of the mouth or cheek is stroked (rooting). °· Increased sucking sounds, smacking lips, cooing, sighing, or squeaking. °· Hand-to-mouth movements. °· Increased sucking of fingers or hands. °Late Signs of Hunger °· Fussing. °· Intermittent crying. °Extreme Signs of Hunger °Signs of extreme hunger will require calming and consoling before your baby will be able to breastfeed successfully. Do not wait for the following signs of extreme hunger to occur before you initiate  breastfeeding: °· Restlessness. °· A loud, strong cry. °· Screaming. °BREASTFEEDING BASICS °Breastfeeding Initiation °· Find a comfortable place to sit or lie down, with your neck and back well supported. °· Place a pillow or rolled up blanket under your baby to bring him or her to the level of your breast (if you are seated). Nursing pillows are specially designed to help support your arms and your baby while you breastfeed. °· Make sure that your baby's abdomen is facing your abdomen. °· Gently massage your breast. With your fingertips, massage from your chest wall toward your nipple in a circular motion. This encourages milk flow. You may need to continue this action during the feeding if your milk flows slowly. °· Support your breast with 4 fingers underneath and your thumb above your nipple. Make sure your fingers are well away from your nipple and your baby's mouth. °· Stroke your baby's lips gently with your finger or nipple. °· When your baby's mouth is open wide enough, quickly bring your baby to your breast, placing your entire nipple and as much of the colored area around your nipple (areola) as possible into your baby's mouth. °¨ More areola should be visible above your baby's upper lip than below the lower lip. °¨ Your baby's tongue should be between his or her lower gum and your breast. °· Ensure that your baby's mouth is correctly positioned around your nipple (latched). Your baby's lips should create a seal on your breast and be turned out (everted). °· It is common for your baby to suck about 2-3 minutes in order to start the flow of breast milk. °Latching °Teaching your baby how to latch on to your breast properly is very important. An improper latch can cause nipple pain and decreased milk supply for you and poor weight gain in your baby. Also, if your baby is not latched onto your nipple properly, he or she may swallow some air during feeding. This can make your baby fussy. Burping your baby when  you switch breasts during the feeding can help to get rid of the air. However, teaching your baby to latch on properly is still the best way to prevent fussiness from swallowing air while breastfeeding. °Signs that your baby has successfully latched on to your nipple: °· Silent tugging or silent sucking, without causing you pain. °· Swallowing heard between every 3-4 sucks. °· Muscle movement above and in front of his or her ears while sucking. °Signs that your baby has not successfully latched on to nipple: °· Sucking sounds or smacking sounds from your baby while breastfeeding. °· Nipple pain. °If you think your baby has not latched on correctly, slip your finger into the corner of your baby's mouth to break the suction and place it between your baby's gums. Attempt breastfeeding initiation again. °Signs of Successful Breastfeeding °  Signs from your baby: °· A gradual decrease in the number of sucks or complete cessation of sucking. °· Falling asleep. °· Relaxation of his or her body. °· Retention of a small amount of milk in his or her mouth. °· Letting go of your breast by himself or herself. °Signs from you: °· Breasts that have increased in firmness, weight, and size 1-3 hours after feeding. °· Breasts that are softer immediately after breastfeeding. °· Increased milk volume, as well as a change in milk consistency and color by the fifth day of breastfeeding. °· Nipples that are not sore, cracked, or bleeding. °Signs That Your Baby is Getting Enough Milk °· Wetting at least 3 diapers in a 24-hour period. The urine should be clear and pale yellow by age 5 days. °· At least 3 stools in a 24-hour period by age 5 days. The stool should be soft and yellow. °· At least 3 stools in a 24-hour period by age 7 days. The stool should be seedy and yellow. °· No loss of weight greater than 10% of birth weight during the first 3 days of age. °· Average weight gain of 4-7 ounces (113-198 g) per week after age 4  days. °· Consistent daily weight gain by age 5 days, without weight loss after the age of 2 weeks. °After a feeding, your baby may spit up a small amount. This is common. °BREASTFEEDING FREQUENCY AND DURATION °Frequent feeding will help you make more milk and can prevent sore nipples and breast engorgement. Breastfeed when you feel the need to reduce the fullness of your breasts or when your baby shows signs of hunger. This is called "breastfeeding on demand." Avoid introducing a pacifier to your baby while you are working to establish breastfeeding (the first 4-6 weeks after your baby is born). After this time you may choose to use a pacifier. Research has shown that pacifier use during the first year of a baby's life decreases the risk of sudden infant death syndrome (SIDS). °Allow your baby to feed on each breast as long as he or she wants. Breastfeed until your baby is finished feeding. When your baby unlatches or falls asleep while feeding from the first breast, offer the second breast. Because newborns are often sleepy in the first few weeks of life, you may need to awaken your baby to get him or her to feed. °Breastfeeding times will vary from baby to baby. However, the following rules can serve as a guide to help you ensure that your baby is properly fed: °· Newborns (babies 4 weeks of age or younger) may breastfeed every 1-3 hours. °· Newborns should not go longer than 3 hours during the day or 5 hours during the night without breastfeeding. °· You should breastfeed your baby a minimum of 8 times in a 24-hour period until you begin to introduce solid foods to your baby at around 6 months of age. °BREAST MILK PUMPING °Pumping and storing breast milk allows you to ensure that your baby is exclusively fed your breast milk, even at times when you are unable to breastfeed. This is especially important if you are going back to work while you are still breastfeeding or when you are not able to be present during  feedings. Your lactation consultant can give you guidelines on how long it is safe to store breast milk. °A breast pump is a machine that allows you to pump milk from your breast into a sterile bottle. The pumped breast milk can   then be stored in a refrigerator or freezer. Some breast pumps are operated by hand, while others use electricity. Ask your lactation consultant which type will work best for you. Breast pumps can be purchased, but some hospitals and breastfeeding support groups lease breast pumps on a monthly basis. A lactation consultant can teach you how to hand express breast milk, if you prefer not to use a pump. °CARING FOR YOUR BREASTS WHILE YOU BREASTFEED °Nipples can become dry, cracked, and sore while breastfeeding. The following recommendations can help keep your breasts moisturized and healthy: °· Avoid using soap on your nipples. °· Wear a supportive bra. Although not required, special nursing bras and tank tops are designed to allow access to your breasts for breastfeeding without taking off your entire bra or top. Avoid wearing underwire-style bras or extremely tight bras. °· Air dry your nipples for 3-4 minutes after each feeding. °· Use only cotton bra pads to absorb leaked breast milk. Leaking of breast milk between feedings is normal. °· Use lanolin on your nipples after breastfeeding. Lanolin helps to maintain your skin's normal moisture barrier. If you use pure lanolin, you do not need to wash it off before feeding your baby again. Pure lanolin is not toxic to your baby. You may also hand express a few drops of breast milk and gently massage that milk into your nipples and allow the milk to air dry. °In the first few weeks after giving birth, some women experience extremely full breasts (engorgement). Engorgement can make your breasts feel heavy, warm, and tender to the touch. Engorgement peaks within 3-5 days after you give birth. The following recommendations can help ease  engorgement: °· Completely empty your breasts while breastfeeding or pumping. You may want to start by applying warm, moist heat (in the shower or with warm water-soaked hand towels) just before feeding or pumping. This increases circulation and helps the milk flow. If your baby does not completely empty your breasts while breastfeeding, pump any extra milk after he or she is finished. °· Wear a snug bra (nursing or regular) or tank top for 1-2 days to signal your body to slightly decrease milk production. °· Apply ice packs to your breasts, unless this is too uncomfortable for you. °· Make sure that your baby is latched on and positioned properly while breastfeeding. °If engorgement persists after 48 hours of following these recommendations, contact your health care provider or a lactation consultant. °OVERALL HEALTH CARE RECOMMENDATIONS WHILE BREASTFEEDING °· Eat healthy foods. Alternate between meals and snacks, eating 3 of each per day. Because what you eat affects your breast milk, some of the foods may make your baby more irritable than usual. Avoid eating these foods if you are sure that they are negatively affecting your baby. °· Drink milk, fruit juice, and water to satisfy your thirst (about 10 glasses a day). °· Rest often, relax, and continue to take your prenatal vitamins to prevent fatigue, stress, and anemia. °· Continue breast self-awareness checks. °· Avoid chewing and smoking tobacco. Chemicals from cigarettes that pass into breast milk and exposure to secondhand smoke may harm your baby. °· Avoid alcohol and drug use, including marijuana. °Some medicines that may be harmful to your baby can pass through breast milk. It is important to ask your health care provider before taking any medicine, including all over-the-counter and prescription medicine as well as vitamin and herbal supplements. °It is possible to become pregnant while breastfeeding. If birth control is desired, ask your health care    provider about options that will be safe for your baby. °SEEK MEDICAL CARE IF: °· You feel like you want to stop breastfeeding or have become frustrated with breastfeeding. °· You have painful breasts or nipples. °· Your nipples are cracked or bleeding. °· Your breasts are red, tender, or warm. °· You have a swollen area on either breast. °· You have a fever or chills. °· You have nausea or vomiting. °· You have drainage other than breast milk from your nipples. °· Your breasts do not become full before feedings by the fifth day after you give birth. °· You feel sad and depressed. °· Your baby is too sleepy to eat well. °· Your baby is having trouble sleeping.   °· Your baby is wetting less than 3 diapers in a 24-hour period. °· Your baby has less than 3 stools in a 24-hour period. °· Your baby's skin or the white part of his or her eyes becomes yellow.   °· Your baby is not gaining weight by 5 days of age. °SEEK IMMEDIATE MEDICAL CARE IF: °· Your baby is overly tired (lethargic) and does not want to wake up and feed. °· Your baby develops an unexplained fever. °  °This information is not intended to replace advice given to you by your health care provider. Make sure you discuss any questions you have with your health care provider. °  °Document Released: 03/03/2005 Document Revised: 11/22/2014 Document Reviewed: 08/25/2012 °Elsevier Interactive Patient Education ©2016 [employer] ° °

## 2015-11-16 NOTE — Lactation Note (Signed)
This note was copied from a baby's chart. Lactation Consultation Note  Patient Name: Anna Annell GreeningSarah Whitehead NWGNF'AToday's Date: 11/16/2015 Reason for consult: Follow-up assessment;Other (Comment);Infant weight loss;Hyperbilirubinemia (6% weight loss, elevated Bili at 32 hours - 9.2 , repeat at Dr. Isidore Moosffice tomorrow, using a NS to latch )  Baby is 39 hours old , awake and hungry. Mom had applied the #20 NS and LC observed. The #20 NS appeared to snug. LC resized mom for #24 NS and LC felt the larger size accommodated the areola more. Baby latched and after easing chin down, and hips back, depth obtained.  Swallows noted several times, increased with breast compressions. When Anna Whitehead released from the breast small amount of colostrum noted in the NS. LC showed mom the curved tip syringe to be used to instill EBM in the top of the NS for an appetizer. And plan on both pumping both breast for 15 -10 mins , save milk for supplement if needed and for an appetizer in NS. Also reviewed with how to finger feed extra EBM if available for extra calories. Plan B if the Bili increases , decreased weight from D/C weight tomorrow, sore nipples slowly improving, baby needs to eb fed and EBM finger fed with syringe of medium based nipple.  [facility] about the use of NS , and the importance of not going 3 hours without offering the breast due to the jaundice level. And extra pumping. Per mom [facility] office has a LC and she will be planning to see her tomorrow.  LC instructed mom on the of a DEBP ( 2 week rental ) and set up. LC discussed trying the #24 Flanges and if uncomfortable due to sore bruise nipples to use coconut oil ) alittle dab will do to decrease friction and if that doesn't help to increase to #27 Flange until soreness improves. LC discussed the importance of consistent stimulation to protect establishing milk supply.  Mother informed of post-discharge support and given phone number to the lactation department,  including services for phone call assistance; out-patient appointments; and breastfeeding support group. List of other breastfeeding resources in the community given in the handout. Encouraged mother to call for problems or concerns related to breastfeeding.   Maternal Data    Feeding Feeding Type: Breast Fed Length of feed: 15 min (swallows noted , increased with breast compressions )  LATCH Score/Interventions Latch: Grasps breast easily, tongue down, lips flanged, rhythmical sucking. Intervention(s): Adjust position;Assist with latch;Breast compression;Breast massage  Audible Swallowing: A few with stimulation  Type of Nipple: Everted at rest and after stimulation (semi compressible areolas , and bruisng ) Intervention(s):  (shield)  Comfort (Breast/Nipple): Filling, red/small blisters or bruises, mild/mod discomfort  Problem noted: Filling;Mild/Moderate discomfort;Cracked, bleeding, blisters, bruises  Hold (Positioning): Assistance needed to correctly position infant at breast and maintain latch. Intervention(s): Breastfeeding basics reviewed;Support Pillows;Position options;Skin to skin  LATCH Score: 7  Lactation Tools Discussed/Used Tools: Shells;Nipple Shields Nipple shield size: 20;24;Other (comment) (resized for #24 NS and baby mouth accomodates it well , better fit ) Shell Type: Inverted Breast pump type: Double-Electric Breast Pump (also rental for 2 weeks ) Pump Review: Setup, frequency, and cleaning;Milk Storage Initiated by:: MAI  Date initiated:: 11/16/15   Consult Status Consult Status: Complete ([facility] [place] [facility] ) Date: 11/16/15    Anna Whitehead, Anna Whitehead Ann 11/16/2015, 12:19 PM

## 2015-11-16 NOTE — Discharge Summary (Signed)
OB Discharge Summary     Patient Name: Anna Whitehead DOB: [birth year] MRN: [id number]  Date of admission: 11/13/2015 Delivering MD: Arlan Organ C   Date of discharge: 11/16/2015  Admitting diagnosis: INDUCTION for GHTN Intrauterine pregnancy: [redacted]w[redacted]d     Secondary diagnosis:  Principal Problem:   Postpartum care following vaginal delivery (8/30) Active Problems:   Anxiety and depression   Gestational hypertension  Additional problems: none     Discharge diagnosis: Term Pregnancy Delivered                                                                                                Post partum procedures:none  Augmentation: AROM, Pitocin, Cytotec and Foley Balloon  Complications: None  Hospital course:  Induction of Labor With Vaginal Delivery   30 y.o. yo G1P1001 at [redacted]w[redacted]d was admitted to the hospital 11/13/2015 for induction of labor.  Indication for induction: Gestational hypertension.  Patient had an uncomplicated labor course as follows: Membrane Rupture Time/Date: 2:30 PM ,11/14/2015   Intrapartum Procedures: Episiotomy: None [1]                                         Lacerations:  2nd degree [3];Vaginal [6]  Patient had delivery of a Viable infant.  Information for the patient's newborn:  Bonny, Vanleeuwen [[id number]]  Delivery Method: Vag-Spont   11/14/2015  Details of delivery can be found in separate delivery note.  Patient had a routine postpartum course. Patient is discharged home 11/16/15.   Physical exam Vitals:   11/14/15 2330 11/15/15 0430 11/15/15 1755 11/16/15 0540  BP: 131/83 130/74 127/73 126/82  Pulse: 68 (!) 54 (!) 52 83  Resp: 18 18 18 18   Temp: 98.6 F (37 C) 98.1 F (36.7 C) 98.4 F (36.9 C) 97.9 F (36.6 C)  TempSrc: Oral Oral Oral   SpO2:      Weight:      Height:       General: alert, cooperative and no distress Lochia: appropriate Uterine Fundus: firm, midline, U-2 Perineum: Healing well with no significant drainage, No  significant erythema DVT Evaluation: No evidence of DVT seen on physical exam. Negative Homan's sign. No cords or calf tenderness. No significant calf/ankle edema. Labs: Lab Results  Component Value Date   WBC 13.8 (H) 11/15/2015   HGB 8.5 (L) 11/15/2015   HCT 28.3 (L) 11/15/2015   MCV 81.6 11/15/2015   PLT 153 11/15/2015   CMP Latest Ref Rng & Units 11/13/2015  Glucose 65 - 99 mg/dL 782(N)  BUN 6 - 20 mg/dL 9  Creatinine 5.62 - 1.30 mg/dL 8.65  Sodium 784 - 696 mmol/L 135  Potassium 3.5 - 5.1 mmol/L 3.7  Chloride 101 - 111 mmol/L 105  CO2 22 - 32 mmol/L 25  Calcium 8.9 - 10.3 mg/dL 2.9(B)  Total Protein 6.5 - 8.1 g/dL 6.6  Total Bilirubin 0.3 - 1.2 mg/dL 0.4  Alkaline Phos 38 - 126 U/L 121  AST 15 - 41 U/L 21  ALT 14 - 54 U/L 11(L)    Discharge instruction: per After Visit Summary and "Baby and Me Booklet".  After visit meds:    Medication List    TAKE these medications   FLUoxetine 20 MG capsule Commonly known as:  PROZAC Take 1 capsule (20 mg total) by mouth daily.   ibuprofen 600 MG tablet Commonly known as:  ADVIL,MOTRIN Take 1 tablet (600 mg total) by mouth every 6 (six) hours.   iron polysaccharides 150 MG capsule Commonly known as:  NIFEREX Take 1 capsule (150 mg total) by mouth daily.   OVER THE COUNTER MEDICATION Take 1 tablet by mouth 2 (two) times daily. Patient takes over the counter Zantac twice a day for heartburn   prenatal multivitamin Tabs tablet Take 1 tablet by mouth daily at 12 noon.   VENTOLIN HFA 108 (90 Base) MCG/ACT inhaler Generic drug:  albuterol Inhale into the lungs.       Diet: routine diet  Activity: Advance as tolerated. Pelvic rest for 6 weeks.   Outpatient follow up:2 weeks and 6 weeks Follow up Appt:No future appointments. Follow up Visit:No Follow-up on file.  Postpartum contraception: Undecided  Newborn Data: Live born female on 11/14/2015 Birth Weight: 7 lb 15.7 oz (3620 g) APGAR: 9, 9  Baby Feeding:  Breast Disposition:home with mother   11/16/2015 Raelyn MoraAWSON, Anissia Wessells, Judie PetitM, CNM

## 2015-11-16 NOTE — Clinical Social Work Maternal (Signed)
  CLINICAL SOCIAL WORK MATERNAL/CHILD NOTE  Patient Details  Name: Anna Whitehead MRN: [id number] Date of Birth: 03/11/1986  Date:  11/16/2015  Clinical Social Worker Initiating Note:  Malvika Tung N Jaquaya Coyle, LCSW Date/ Time Initiated:  11/16/15/0931     Child's Name:  Lauchlin   Legal Guardian:  Mother   Need for Interpreter:  None   Date of Referral:  11/16/15     Reason for Referral:  Other (Comment) (hx of anxiety and depression)   Referral Source:  RN   Address:     Phone number:      Household Members:  Spouse   Natural Supports (not living in the home):  Friends, Extended Family, Immediate Family   Professional Supports: None   Employment: Full-time   Type of Work: taking time off work to be with baby (FMLA)   Education:  College graduate   Financial Resources:  Private Insurance   Other Resources:      Cultural/Religious Considerations Which May Impact Care:  none reported  Strengths:  Ability to meet basic needs , Compliance with medical plan , Home prepared for child , Pediatrician chosen    Risk Factors/Current Problems:  Adjustment to Illness , Mental Health Concerns    Cognitive State:  Alert , Linear Thinking , Goal Oriented , Insightful    Mood/Affect:  Bright , Calm , Comfortable    CSW Assessment: LCSW received consult for hx of anxiety and depression.  MOB in room with FOB and open to assessment of needs. LCSW explained role and educated MOB and FOB on reason for consult. MOB reports history of anxiety with medications prior to pregnancy. She reports throughout pregnancy she was stable with no emotional issues or problems, weaned herself off medication with MD assistance (prozac).   She questions next steps with regards of re-starting if needing and how process works. LCSW assessed who was managing psychiatric needs prior to pregnancy and she reports her PCP. Reports she has not been recently, thus will refer to OB for symptom management.   Reports she is not wanting to re-start at this time and will manage symptoms with other coping skills.  Denies need for Psychiatric referral for meds or therapy. MOB and FOB were educated and given information regarding PPD/anxiety and signs and symptoms.   She verbalizes understanding and also given some information about support groups if needing additional support.  LCSW explained the benefit to exercise, taking care of self with sleeping and eating and using family for support if needing a break or feeling overwhelmed. MOB reports no other needs at this time. She reports she is ready to go home and has all she needs for baby and support at home.  FOB had no other questions at this time.     CSW Plan/Description:  Patient/Family Education , No Further Intervention Required/No Barriers to Discharge    Derian Pfost N, LCSW 11/16/2015, 9:33 AM  

## 2015-11-16 NOTE — Lactation Note (Signed)
This note was copied from a baby's chart. Lactation Consultation Note Mother just finished feeding the baby with a #20 nipple shield. Mother plans to call for the next feeding.   Patient Name: Anna Annell GreeningSarah Whitehead ZOXWR'UToday's Date: 11/16/2015     Maternal Data    Feeding    LATCH Score/Interventions                      Lactation Tools Discussed/Used     Consult Status      [name], [name] 11/16/2015, 4:14 PM

## 2016-06-05 ENCOUNTER — Encounter: Payer: Self-pay | Admitting: Family Medicine

## 2016-06-05 ENCOUNTER — Ambulatory Visit (INDEPENDENT_AMBULATORY_CARE_PROVIDER_SITE_OTHER): Payer: BC Managed Care – PPO | Admitting: Family Medicine

## 2016-06-05 VITALS — BP 110/84 | HR 110 | Temp 98.5°F | Resp 16 | Ht 70.0 in | Wt 227.6 lb

## 2016-06-05 DIAGNOSIS — R5383 Other fatigue: Secondary | ICD-10-CM

## 2016-06-05 DIAGNOSIS — J069 Acute upper respiratory infection, unspecified: Secondary | ICD-10-CM | POA: Diagnosis not present

## 2016-06-05 DIAGNOSIS — R109 Unspecified abdominal pain: Secondary | ICD-10-CM | POA: Diagnosis not present

## 2016-06-05 DIAGNOSIS — F331 Major depressive disorder, recurrent, moderate: Secondary | ICD-10-CM | POA: Diagnosis not present

## 2016-06-05 DIAGNOSIS — J4521 Mild intermittent asthma with (acute) exacerbation: Secondary | ICD-10-CM | POA: Diagnosis not present

## 2016-06-05 LAB — TSH: TSH: 1.94 mIU/L

## 2016-06-05 LAB — COMPREHENSIVE METABOLIC PANEL
ALK PHOS: 68 U/L (ref 33–115)
ALT: 10 U/L (ref 6–29)
AST: 17 U/L (ref 10–30)
Albumin: 4.4 g/dL (ref 3.6–5.1)
BUN: 12 mg/dL (ref 7–25)
CO2: 31 mmol/L (ref 20–31)
CREATININE: 0.82 mg/dL (ref 0.50–1.10)
Calcium: 9.2 mg/dL (ref 8.6–10.2)
Chloride: 103 mmol/L (ref 98–110)
Glucose, Bld: 93 mg/dL (ref 65–99)
POTASSIUM: 4.6 mmol/L (ref 3.5–5.3)
SODIUM: 141 mmol/L (ref 135–146)
Total Bilirubin: 0.3 mg/dL (ref 0.2–1.2)
Total Protein: 7.3 g/dL (ref 6.1–8.1)

## 2016-06-05 LAB — CBC
HCT: 42.6 % (ref 35.0–45.0)
Hemoglobin: 13.5 g/dL (ref 11.7–15.5)
MCH: 26.7 pg — ABNORMAL LOW (ref 27.0–33.0)
MCHC: 31.7 g/dL — ABNORMAL LOW (ref 32.0–36.0)
MCV: 84.4 fL (ref 80.0–100.0)
MPV: 10.2 fL (ref 7.5–12.5)
PLATELETS: 220 10*3/uL (ref 140–400)
RBC: 5.05 MIL/uL (ref 3.80–5.10)
RDW: 16 % — AB (ref 11.0–15.0)
WBC: 5.8 10*3/uL (ref 3.8–10.8)

## 2016-06-05 LAB — POCT URINALYSIS DIPSTICK
BILIRUBIN UA: NEGATIVE
Glucose, UA: NEGATIVE
Ketones, UA: NEGATIVE
LEUKOCYTES UA: NEGATIVE
NITRITE UA: NEGATIVE
PH UA: 7 (ref 5.0–8.0)
PROTEIN UA: NEGATIVE
RBC UA: NEGATIVE
Spec Grav, UA: 1.03 (ref 1.030–1.035)
UROBILINOGEN UA: NEGATIVE (ref ?–2.0)

## 2016-06-05 LAB — VITAMIN B12: Vitamin B-12: 352 pg/mL (ref 200–1100)

## 2016-06-05 MED ORDER — MEPERIDINE-PROMETHAZINE 50-25 MG PO CAPS: 1.0000 | ORAL_CAPSULE | ORAL | 0 refills | Status: DC | PRN

## 2016-06-05 MED ORDER — BUDESONIDE-FORMOTEROL FUMARATE 160-4.5 MCG/ACT IN AERO: 2.0000 | INHALATION_SPRAY | Freq: Two times a day (BID) | RESPIRATORY_TRACT | 0 refills | Status: DC

## 2016-06-05 MED ORDER — ALBUTEROL SULFATE HFA 108 (90 BASE) MCG/ACT IN AERS: 2.0000 | INHALATION_SPRAY | RESPIRATORY_TRACT | 0 refills | Status: DC | PRN

## 2016-06-05 MED ORDER — DULOXETINE HCL 30 MG PO CPEP: 30.0000 mg | ORAL_CAPSULE | Freq: Every day | ORAL | 0 refills | Status: DC

## 2016-06-05 NOTE — Progress Notes (Addendum)
Name: Anna Whitehead   MRN: [id number]    DOB: November 22, 1985   Date:06/05/2016       Progress Note  Subjective  Chief Complaint  Chief Complaint  Patient presents with  . Back Pain     lower rt side started today    HPI  Right side pain: [name] came in today with acute onset of right flank pain, she has a history of intermittent low back pain, but states this pain is different. It happens in waves, intense when present and unable to find a comfortable position when present. She has been afebrile, no dysuria, increase in urinary frequency or hematuria. She denies personal or family history of kidney stones. She has however felt some lower abdominal cramping over the past few days ( like my cycle is about to start ), she states it happens intermittently since IUD placed. No change in bowel movements, blood or mucus in her stools. No nausea or vomiting  URI: she states that over the past few days she has noticed clear rhinorrhea, nasal congestion, sore throat and mild cough, she is taking otc medication and is not really bothering her.   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 She also feels very tired but not sure if secondary to having a new baby. She started seeing a therapist last week and is currently on Prozac but states symptoms are getting worse instead of improving.   Patient Active Problem List   Diagnosis Date Noted  . History of gestational hypertension 11/14/2015  . Asthma, mild 10/13/2014  . H/O: depression 10/13/2014  . H/O high risk medication treatment 10/13/2014  . Overweight 10/13/2014  . Anxiety and depression 10/13/2014    Past Surgical History:  Procedure Laterality Date  . MYRINGOPLASTY    . NO PAST SURGERIES      Family History  Problem Relation Age  of Onset  . Bipolar disorder Mother   . Cancer Maternal Aunt     3 of her aunts had breast cancer: 2 passed away    Social History   Social History  . Marital status: Married    Spouse name: N/A  . Number of children: N/A  . Years of education: N/A   Occupational History  . teacher    Social History Main Topics  . Smoking status: Never Smoker  . Smokeless tobacco: Never Used  . Alcohol use 0.0 oz/week     Comment: ocassionally  . Drug use: No  . Sexual activity: Yes    Partners: Male   Other Topics Concern  . Not on file   Social History Narrative   Married, first son was born 11/13/2016     Current Outpatient Prescriptions:  .  albuterol (VENTOLIN HFA) 108 (90 Base) MCG/ACT inhaler, Inhale 2 puffs into the lungs every 4 (four) hours as needed for wheezing or shortness of breath., Disp: 1 Inhaler, Rfl: 0 .  budesonide-formoterol (SYMBICORT) 160-4.5 MCG/ACT inhaler, Inhale 2 puffs into the lungs 2 (two) times daily., Disp: 1 Inhaler, Rfl: 0 .  DULoxetine (CYMBALTA) 30 MG capsule, Take 1-2 capsules (30-60 mg total) by mouth daily. First week 30 mg after that 60 mg and stop Prozac, Disp: 60 capsule, Rfl: 0 .  meperidine-promethazine (MEPROZINE) 50-25 MG CAPS capsule, Take  1 capsule by mouth every 4 (four) hours as needed., Disp: 20 capsule, Rfl: 0 .  OVER THE COUNTER MEDICATION, Take 1 tablet by mouth 2 (two) times daily. Patient takes over the counter Zantac twice a day for heartburn, Disp: , Rfl:   No Known Allergies   ROS  Constitutional: Negative for fever, positive for weight change.  Respiratory: Positive  for cough and shortness of breath.   Cardiovascular: Negative for chest pain or palpitations.  Gastrointestinal: Positive  for abdominal pain, no bowel changes.  Musculoskeletal: Negative for gait problem or joint swelling.  Skin: Negative for rash.  Neurological: Negative for dizziness or headache.  No other specific complaints in a complete review of  systems (except as listed in HPI above).  Objective  Vitals:   06/05/16 1059  BP: 110/84  Pulse: (!) 110  Resp: 16  Temp: 98.5 F (36.9 C)  SpO2: 96%  Weight: 227 lb 9 oz (103.2 kg)  Height: 5\' 10"  (1.778 m)    Body mass index is 32.65 kg/m.  Physical Exam  Constitutional: Patient appears well-developed and well-nourished. Obese  No distress.  HEENT: head atraumatic, normocephalic, pupils equal and reactive to light,  neck supple, throat within normal limits Cardiovascular: Normal rate, regular rhythm and normal heart sounds.  No murmur heard. No BLE edema. Pulmonary/Chest: Effort normal and breath sounds normal. No respiratory distress. Abdominal: Soft.  There is  Tenderness right flank, no pain on abdominal area. Psychiatric: Patient has a normal mood and affect. behavior is normal. Judgment and thought content normal.  Recent Results (from the past 2160 hour(s))  POCT urinalysis dipstick     Status: Normal   Collection Time: 06/05/16 11:09 AM  Result Value Ref Range   Color, UA yellow    Clarity, UA clear    Glucose, UA neg    Bilirubin, UA neg    Ketones, UA neg    Spec Grav, UA 1.030 1.030 - 1.035   Blood, UA neg    pH, UA 7.0 5.0 - 8.0   Protein, UA neg    Urobilinogen, UA negative Negative - 2.0   Nitrite, UA neg    Leukocytes, UA Negative Negative      PHQ2/9: Depression screen [facility] 2/9 06/05/2016 10/13/2014  Decreased Interest 2 1  Down, Depressed, Hopeless 2 1  PHQ - 2 Score 4 2  Altered sleeping 2 1  Tired, decreased energy 3 1  Change in appetite 2 1  Feeling bad or failure about yourself  3 2  Trouble concentrating 2 1  Moving slowly or fidgety/restless 0 0  Suicidal thoughts 0 0  PHQ-9 Score 16 8  Difficult doing work/chores Somewhat difficult Somewhat difficult     Fall Risk: Fall Risk  10/13/2014  Falls in the past year? No    Assessment & Plan  1. Acute right flank pain  Discussed possible diagnosis, including kidney stone, kidney  infection, muscle spasms/strain, discussed getting CT now, or straining urine, try pain medication and send urine for culture. She would like to wait for now. She will contact me by tomorrow if symptoms have not improved  - POCT urinalysis dipstick - Urine Culture - meperidine-promethazine (MEPROZINE) 50-25 MG CAPS capsule; Take 1 capsule by mouth every 4 (four) hours as needed.  Dispense: 20 capsule; Refill: 0  2. Acute URI  Continue otc medication   3. Major Moderate Depression   - DULoxetine (CYMBALTA) 30 MG capsule; Take 1-2 capsules (30-60 mg total) by mouth daily.  First week 30 mg after that 60 mg and stop Prozac  Dispense: 60 capsule; Refill: 0  4. Asthma in adult, mild intermittent, with acute exacerbation  - albuterol (VENTOLIN HFA) 108 (90 Base) MCG/ACT inhaler; Inhale 2 puffs into the lungs every 4 (four) hours as needed for wheezing or shortness of breath.  Dispense: 1 Inhaler; Refill: 0 - budesonide-formoterol (SYMBICORT) 160-4.5 MCG/ACT inhaler; Inhale 2 puffs into the lungs 2 (two) times daily.  Dispense: 1 Inhaler; Refill: 0   5. Fatigue, unspecified type  - CBC - Comprehensive metabolic panel - [id number] - TSH - Vitamin D (25 hydroxy)

## 2016-06-06 LAB — VITAMIN D 25 HYDROXY (VIT D DEFICIENCY, FRACTURES): Vit D, 25-Hydroxy: 22 ng/mL — ABNORMAL LOW (ref 30–100)

## 2016-06-06 LAB — URINE CULTURE: Organism ID, Bacteria: NO GROWTH

## 2016-07-02 ENCOUNTER — Other Ambulatory Visit: Payer: Self-pay | Admitting: Family Medicine

## 2016-07-02 DIAGNOSIS — F331 Major depressive disorder, recurrent, moderate: Secondary | ICD-10-CM

## 2016-07-02 DIAGNOSIS — J4521 Mild intermittent asthma with (acute) exacerbation: Secondary | ICD-10-CM

## 2016-07-18 ENCOUNTER — Ambulatory Visit (INDEPENDENT_AMBULATORY_CARE_PROVIDER_SITE_OTHER): Payer: BC Managed Care – PPO | Admitting: Family Medicine

## 2016-07-18 VITALS — BP 104/68 | HR 87 | Temp 97.9°F | Resp 16 | Wt 234.1 lb

## 2016-07-18 DIAGNOSIS — E538 Deficiency of other specified B group vitamins: Secondary | ICD-10-CM | POA: Diagnosis not present

## 2016-07-18 DIAGNOSIS — R5383 Other fatigue: Secondary | ICD-10-CM | POA: Diagnosis not present

## 2016-07-18 DIAGNOSIS — F331 Major depressive disorder, recurrent, moderate: Secondary | ICD-10-CM

## 2016-07-18 DIAGNOSIS — E559 Vitamin D deficiency, unspecified: Secondary | ICD-10-CM | POA: Diagnosis not present

## 2016-07-18 DIAGNOSIS — E669 Obesity, unspecified: Secondary | ICD-10-CM | POA: Diagnosis not present

## 2016-07-18 MED ORDER — CYANOCOBALAMIN 1000 MCG/ML IJ SOLN
1000.0000 ug | Freq: Once | INTRAMUSCULAR | Status: AC
  Administered 2016-07-18: 1000 ug via INTRAMUSCULAR

## 2016-07-18 MED ORDER — VITAMIN D 50 MCG (2000 UT) PO CAPS: 1.0000 | ORAL_CAPSULE | Freq: Every day | ORAL | 0 refills | Status: DC

## 2016-07-18 MED ORDER — FLUOXETINE HCL 90 MG PO CPDR: 90.0000 mg | DELAYED_RELEASE_CAPSULE | ORAL | 1 refills | Status: DC

## 2016-07-18 MED ORDER — ESCITALOPRAM OXALATE 10 MG PO TABS: 10.0000 mg | ORAL_TABLET | Freq: Every day | ORAL | 1 refills | Status: DC

## 2016-07-18 NOTE — Progress Notes (Signed)
Name: Anna Whitehead   MRN: [id number]    DOB: [birth year]   Date:07/18/2016       Progress Note  Subjective  Chief Complaint  Chief Complaint  Patient presents with  . 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 She also feels very tired but not sure if secondary to having a new baby. She started seeing a therapist last week and is currently on Prozac but states symptoms are getting worse instead of improving. She was seen in our office March 2018 and we gave her Cymbalta, she states it caused nausea and made her feel even more tired, she was taking medication on and off but would like to try something different. She has difficulty with daily medication use, she has taken Prozac but states skipped many doses, we will try weekly prescription. She is still Manufacturing engineer. States her stress level has increased after the [place] that affect [place] and her school had to be relocated. She has not been cleaning her house, has anhedonia and has episodes of crying for hours at a time. She has an understanding husband- but he also struggles with depression/anxiety, her parents are also supportive.   Obesity: she has a long history of obesity but states stress makes her eat and causes her to gain weight that in turn causes more stress. Discussed options, and she is willing to have labs and will consider taking a GLP-1 agonist. She denies family history of MEN syndrome or medullary thyroid carcinoma. There is no personal history of pancreatitis    Patient Active Problem List   Diagnosis Date Noted  . Depression, major, recurrent, moderate (HCC) 07/18/2016   . B12 deficiency 07/18/2016  . Vitamin D insufficiency 07/18/2016  . History of gestational hypertension 11/14/2015  . Asthma, mild 10/13/2014  . H/O high risk medication treatment 10/13/2014  . Overweight 10/13/2014  . Anxiety and depression 10/13/2014    Past Surgical History:  Procedure Laterality Date  . MYRINGOPLASTY    . NO PAST SURGERIES      Family History  Problem Relation Age of Onset  . Bipolar disorder Mother   . Cancer Maternal Aunt     3 of her aunts had breast cancer: 2 passed away    Social History   Social History  . Marital status: Married    Spouse name: N/A  . Number of children: N/A  . Years of education: N/A   Occupational History  . teacher    Social History Main Topics  . Smoking status: Never Smoker  . Smokeless tobacco: Never Used  . Alcohol use 0.0 oz/week     Comment: ocassionally  . Drug use: No  . Sexual activity: Yes    Partners: Male   Other Topics Concern  . Not on file   Social History Narrative   Married, first son was born 11/13/2016     Current Outpatient Prescriptions:  .  albuterol (VENTOLIN HFA) 108 (90 Base) MCG/ACT inhaler, Inhale 2 puffs into the lungs every 4 (four) hours as needed for wheezing or shortness of breath., Disp: 1 Inhaler, Rfl: 0 .  meperidine-promethazine (MEPROZINE) 50-25 MG CAPS capsule, Take 1 capsule by mouth every 4 (four) hours as needed., Disp: 20 capsule, Rfl: 0 .  OVER THE COUNTER MEDICATION, Take 1 tablet by mouth 2 (two) times daily. Patient takes over the counter Zantac twice a day for heartburn, Disp: , Rfl:  .  SYMBICORT 160-4.5 MCG/ACT inhaler, INHALE 2 PUFFS INTO THE LUNGS 2 (TWO) TIMES DAILY., Disp: 10.2 Inhaler, Rfl: 0  Allergies  Allergen Reactions  . Duloxetine Nausea Only    Dizziness      ROS  Constitutional: Negative for fever, positive  weight change.  Respiratory: Negative for cough and shortness of breath.  Cardiovascular: Negative for chest pain or palpitations.   Gastrointestinal: Negative for abdominal pain, no bowel changes.  Musculoskeletal: Negative for gait problem or joint swelling.  Skin: Negative for rash.  Neurological: Negative for dizziness or headache.  No other specific complaints in a complete review of systems (except as listed in HPI above).  Objective  Vitals:   07/18/16 1525  BP: 104/68  Pulse: 87  Resp: 16  Temp: 97.9 F (36.6 C)  SpO2: 97%  Weight: 234 lb 2 oz (106.2 kg)    Body mass index is 33.59 kg/m.  Physical Exam  Constitutional: Patient appears well-developed and well-nourished. Obese  No distress.  HEENT: head atraumatic, normocephalic, pupils equal and reactive to light, neck supple, throat within normal limits, no thyromegaly Cardiovascular: Normal rate, regular rhythm and normal heart sounds.  No murmur heard. No BLE edema. Pulmonary/Chest: Effort normal and breath sounds normal. No respiratory distress. Abdominal: Soft.  There is no tenderness. Psychiatric: Patient has a depressed  mood ( cried during the visit)  behavior is normal. Judgment and thought content normal.  Recent Results (from the past 2160 hour(s))  POCT urinalysis dipstick     Status: Normal   Collection Time: 06/05/16 11:09 AM  Result Value Ref Range   Color, UA yellow    Clarity, UA clear    Glucose, UA neg    Bilirubin, UA neg    Ketones, UA neg    Spec Grav, UA 1.030 1.030 - 1.035   Blood, UA neg    pH, UA 7.0 5.0 - 8.0   Protein, UA neg    Urobilinogen, UA negative Negative - 2.0   Nitrite, UA neg    Leukocytes, UA Negative Negative  CBC     Status: Abnormal   Collection Time: 06/05/16 12:25 PM  Result Value Ref Range   WBC 5.8 3.8 - 10.8 K/uL   RBC 5.05 3.80 - 5.10 MIL/uL   Hemoglobin 13.5 11.7 - 15.5 g/dL   HCT 16.1 09.6 - 04.5 %   MCV 84.4 80.0 - 100.0 fL   MCH 26.7 (L) 27.0 - 33.0 pg   MCHC 31.7 (L) 32.0 - 36.0 g/dL   RDW 40.9 (H) 81.1 - 91.4 %   Platelets 220 140 - 400 K/uL   MPV 10.2 7.5 - 12.5 fL   Comprehensive metabolic panel     Status: None   Collection Time: 06/05/16 12:25 PM  Result Value Ref Range   Sodium 141 135 - 146 mmol/L   Potassium 4.6 3.5 - 5.3 mmol/L   Chloride 103 98 - 110 mmol/L   CO2 31 20 - 31 mmol/L   Glucose, Bld 93 65 - 99 mg/dL   BUN 12 7 - 25 mg/dL   Creat 7.82 9.56 - 2.13 mg/dL   Total Bilirubin 0.3 0.2 - 1.2 mg/dL  Alkaline Phosphatase 68 33 - 115 U/L   AST 17 10 - 30 U/L   ALT 10 6 - 29 U/L   Total Protein 7.3 6.1 - 8.1 g/dL   Albumin 4.4 3.6 - 5.1 g/dL   Calcium 9.2 8.6 - 16.110.2 mg/dL  [id number]     Status: None   Collection Time: 06/05/16 12:25 PM  Result Value Ref Range   Vitamin B-12 352 200 - 1,100 pg/mL  TSH     Status: None   Collection Time: 06/05/16 12:25 PM  Result Value Ref Range   TSH 1.94 mIU/L    Comment:   Reference Range   > or = 20 Years  0.40-4.50   Pregnancy Range First trimester  0.26-2.66 Second trimester 0.55-2.73 Third trimester  0.43-2.91     Vitamin D (25 hydroxy)     Status: Abnormal   Collection Time: 06/05/16 12:25 PM  Result Value Ref Range   Vit D, 25-Hydroxy 22 (L) 30 - 100 ng/mL    Comment: Vitamin D Status           25-OH Vitamin D        Deficiency                <20 ng/mL        Insufficiency         20 - 29 ng/mL        Optimal             > or = 30 ng/mL   For 25-OH Vitamin D testing on patients on D2-supplementation and patients for whom quantitation of D2 and D3 fractions is required, the [facility] 25-OH VIT D, (D2,D3), LC/MS/MS is recommended: order code [id number] (patients > 2 yrs).   Urine Culture     Status: None   Collection Time: 06/05/16 12:27 PM  Result Value Ref Range   Organism ID, Bacteria NO GROWTH       PHQ2/9: Depression screen [facility] 2/9 06/05/2016 10/13/2014  Decreased Interest 2 1  Down, Depressed, Hopeless 2 1  PHQ - 2 Score 4 2  Altered sleeping 2 1  Tired, decreased energy 3 1  Change in appetite 2 1  Feeling bad or failure about yourself  3 2  Trouble concentrating 2 1   Moving slowly or fidgety/restless 0 0  Suicidal thoughts 0 0  PHQ-9 Score 16 8  Difficult doing work/chores Somewhat difficult Somewhat difficult     Fall Risk: Fall Risk  10/13/2014  Falls in the past year? No     Assessment & Plan  1. Depression, major, recurrent, moderate (HCC)  - FLUoxetine (PROZAC WEEKLY) 90 MG DR capsule; Take 1 capsule (90 mg total) by mouth every 7 (seven) days.  Dispense: 4 capsule; Refill: 1  2. B12 deficiency  - cyanocobalamin ((VITAMIN B-12)) injection 1,000 mcg; Inject 1 mL (1,000 mcg total) into the muscle once.  3. Vitamin D insufficiency  - Cholecalciferol (VITAMIN D) 2000 units CAPS; Take 1 capsule (2,000 Units total) by mouth daily.  Dispense: 30 capsule; Refill: 0  4. Other fatigue  Discussed labs, likely secondary to depression   5. Obesity (BMI 30.0-34.9)  - Insulin, random - Hemoglobin A1c

## 2016-07-18 NOTE — Patient Instructions (Addendum)
GLP-1 agonist : Ozempic, Trulicity, Victoza  B12 sublingual 1000 mcg daily

## 2016-07-19 LAB — HEMOGLOBIN A1C
Hgb A1c MFr Bld: 5.1 % (ref <5.7)
MEAN PLASMA GLUCOSE: 100 mg/dL

## 2016-07-21 LAB — INSULIN, RANDOM: Insulin: 5.6 u[IU]/mL (ref 2.0–19.6)

## 2016-09-02 ENCOUNTER — Other Ambulatory Visit: Payer: Self-pay | Admitting: Family Medicine

## 2016-09-02 DIAGNOSIS — F331 Major depressive disorder, recurrent, moderate: Secondary | ICD-10-CM

## 2016-09-03 ENCOUNTER — Ambulatory Visit (INDEPENDENT_AMBULATORY_CARE_PROVIDER_SITE_OTHER): Payer: BC Managed Care – PPO | Admitting: Family Medicine

## 2016-09-03 ENCOUNTER — Encounter: Payer: Self-pay | Admitting: Family Medicine

## 2016-09-03 VITALS — BP 122/70 | HR 96 | Temp 98.4°F | Resp 16 | Ht 70.0 in | Wt 225.3 lb

## 2016-09-03 DIAGNOSIS — R05 Cough: Secondary | ICD-10-CM | POA: Diagnosis not present

## 2016-09-03 DIAGNOSIS — J029 Acute pharyngitis, unspecified: Secondary | ICD-10-CM

## 2016-09-03 DIAGNOSIS — R059 Cough, unspecified: Secondary | ICD-10-CM

## 2016-09-03 DIAGNOSIS — R0981 Nasal congestion: Secondary | ICD-10-CM | POA: Diagnosis not present

## 2016-09-03 LAB — POCT RAPID STREP A (OFFICE): Rapid Strep A Screen: NEGATIVE

## 2016-09-03 MED ORDER — MAGIC MOUTHWASH W/LIDOCAINE: 5.0000 mL | Freq: Three times a day (TID) | ORAL | 0 refills | Status: DC | PRN

## 2016-09-03 MED ORDER — BENZONATATE 100 MG PO CAPS: 100.0000 mg | ORAL_CAPSULE | Freq: Three times a day (TID) | ORAL | 0 refills | Status: DC | PRN

## 2016-09-03 MED ORDER — AZELASTINE-FLUTICASONE 137-50 MCG/ACT NA SUSP: 2.0000 | Freq: Every day | NASAL | 0 refills | Status: DC

## 2016-09-03 NOTE — Patient Instructions (Signed)
Cool Mist Humidifier Tylenol - Do not exceed 3000mg  in a day Drink plenty of fluids Re-start Symbicort Daily and use Albuterol as needed.

## 2016-09-03 NOTE — Telephone Encounter (Signed)
Patient requesting refill of Prozac to [facility].

## 2016-09-03 NOTE — Progress Notes (Addendum)
Name: Anna Whitehead   MRN: [id number]    DOB: 05-Jul-1985   Date:09/03/2016       Progress Note  Subjective  Chief Complaint  Chief Complaint  Patient presents with  . Sore Throat    cough, chills,cough,headache,fever,trouble swollening    HPI  Pt presents with 3-4 day history of very sore throat, nasal congestion, and mild-moderate frontal/sinus headache. She endorses coughing with near vomiting episodes and some shortness of breath (has hx asthma and has not taken Symbicort in several months and has not needed albuterol recently); endorses nausea, body aches, subjective fevers/chills.  Denies chest pain, abdominal pain or diarrhea, ear pain/pressure, or urinary symptoms.   Patient Active Problem List   Diagnosis Date Noted  . Depression, major, recurrent, moderate (HCC) 07/18/2016  . B12 deficiency 07/18/2016  . Vitamin D insufficiency 07/18/2016  . History of gestational hypertension 11/14/2015  . Asthma, mild 10/13/2014  . H/O high risk medication treatment 10/13/2014  . Overweight 10/13/2014  . Anxiety and depression 10/13/2014    Social History  Substance Use Topics  . Smoking status: Never Smoker  . Smokeless tobacco: Never Used  . Alcohol use 0.0 oz/week     Comment: ocassionally    Current Outpatient Prescriptions:  .  albuterol (VENTOLIN HFA) 108 (90 Base) MCG/ACT inhaler, Inhale 2 puffs into the lungs every 4 (four) hours as needed for wheezing or shortness of breath., Disp: 1 Inhaler, Rfl: 0 .  FLUoxetine (PROZAC WEEKLY) 90 MG DR capsule, Take 1 capsule (90 mg total) by mouth every 7 (seven) days., Disp: 4 capsule, Rfl: 1 .  OVER THE COUNTER MEDICATION, Take 1 tablet by mouth 2 (two) times daily. Patient takes over the counter Zantac twice a day for heartburn, Disp: , Rfl:  .  SYMBICORT 160-4.5 MCG/ACT inhaler, INHALE 2 PUFFS INTO THE LUNGS 2 (TWO) TIMES DAILY., Disp: 10.2 Inhaler, Rfl: 0 .  Cholecalciferol (VITAMIN D) 2000 units CAPS, Take 1 capsule (2,000 Units  total) by mouth daily. (Patient not taking: Reported on 09/03/2016), Disp: 30 capsule, Rfl: 0  Allergies  Allergen Reactions  . Duloxetine Nausea Only    Dizziness     ROS  Ten systems reviewed and is negative except as mentioned in HPI   Objective  Vitals:   09/03/16 0843  BP: 122/70  Pulse: 96  Resp: 16  Temp: 98.4 F (36.9 C)  TempSrc: Oral  SpO2: 98%  Weight: 225 lb 4.8 oz (102.2 kg)  Height: 5\' 10"  (1.778 m)    Body mass index is 32.33 kg/m.  Nursing Note and Vital Signs reviewed.  Physical Exam  Constitutional: Patient appears well-developed and well-nourished. Obese. No distress.  HEENT: head atraumatic, normocephalic, pupils equal and reactive to light, EOM's intact, TM's without erythema or bulging, no maxillary or frontal sinus pain on palpation, neck supple with bilateral moderate submandibular lymphadenopathy, oropharynx erythematous, tonsils +1 and moist without exudate Cardiovascular: Normal rate, regular rhythm, S1/S2 present.  No murmur or rub heard. No BLE edema. Pulmonary/Chest: Effort normal and breath sounds clear. No respiratory distress or retractions. Abdominal: Soft and non-tender, bowel sounds present x4 quadrants. Psychiatric: Patient has a normal mood and affect. behavior is normal. Judgment and thought content normal.  Recent Results (from the past 2160 hour(s))  POCT urinalysis dipstick     Status: Normal   Collection Time: 06/05/16 11:09 AM  Result Value Ref Range   Color, UA yellow    Clarity, UA clear    Glucose, UA neg  Bilirubin, UA neg    Ketones, UA neg    Spec Grav, UA 1.030 1.030 - 1.035   Blood, UA neg    pH, UA 7.0 5.0 - 8.0   Protein, UA neg    Urobilinogen, UA negative Negative - 2.0   Nitrite, UA neg    Leukocytes, UA Negative Negative  CBC     Status: Abnormal   Collection Time: 06/05/16 12:25 PM  Result Value Ref Range   WBC 5.8 3.8 - 10.8 K/uL   RBC 5.05 3.80 - 5.10 MIL/uL   Hemoglobin 13.5 11.7 - 15.5 g/dL    HCT 16.142.6 09.635.0 - 04.545.0 %   MCV 84.4 80.0 - 100.0 fL   MCH 26.7 (L) 27.0 - 33.0 pg   MCHC 31.7 (L) 32.0 - 36.0 g/dL   RDW 40.916.0 (H) 81.111.0 - 91.415.0 %   Platelets 220 140 - 400 K/uL   MPV 10.2 7.5 - 12.5 fL  Comprehensive metabolic panel     Status: None   Collection Time: 06/05/16 12:25 PM  Result Value Ref Range   Sodium 141 135 - 146 mmol/L   Potassium 4.6 3.5 - 5.3 mmol/L   Chloride 103 98 - 110 mmol/L   CO2 31 20 - 31 mmol/L   Glucose, Bld 93 65 - 99 mg/dL   BUN 12 7 - 25 mg/dL   Creat 7.820.82 9.560.50 - 2.131.10 mg/dL   Total Bilirubin 0.3 0.2 - 1.2 mg/dL   Alkaline Phosphatase 68 33 - 115 U/L   AST 17 10 - 30 U/L   ALT 10 6 - 29 U/L   Total Protein 7.3 6.1 - 8.1 g/dL   Albumin 4.4 3.6 - 5.1 g/dL   Calcium 9.2 8.6 - 08.610.2 mg/dL  [id number]     Status: None   Collection Time: 06/05/16 12:25 PM  Result Value Ref Range   Vitamin B-12 352 200 - 1,100 pg/mL  TSH     Status: None   Collection Time: 06/05/16 12:25 PM  Result Value Ref Range   TSH 1.94 mIU/L    Comment:   Reference Range   > or = 20 Years  0.40-4.50   Pregnancy Range First trimester  0.26-2.66 Second trimester 0.55-2.73 Third trimester  0.43-2.91     Vitamin D (25 hydroxy)     Status: Abnormal   Collection Time: 06/05/16 12:25 PM  Result Value Ref Range   Vit D, 25-Hydroxy 22 (L) 30 - 100 ng/mL    Comment: Vitamin D Status           25-OH Vitamin D        Deficiency                <20 ng/mL        Insufficiency         20 - 29 ng/mL        Optimal             > or = 30 ng/mL   For 25-OH Vitamin D testing on patients on D2-supplementation and patients for whom quantitation of D2 and D3 fractions is required, the QuestAssureD 25-OH VIT D, (D2,D3), LC/MS/MS is recommended: order code [id number] (patients > 2 yrs).   Urine Culture     Status: None   Collection Time: 06/05/16 12:27 PM  Result Value Ref Range   Organism ID, Bacteria NO GROWTH   Insulin, random     Status: None   Collection Time: 07/18/16  4:22 PM  Result  Value Ref Range   Insulin 5.6 2.0 - 19.6 uIU/mL    Comment: This insulin assay shows strong cross-reactivity for some insulin analogs (lispro, aspart, and glargine) and much lower cross-reactivity with others (detemir, glulisine).   Hemoglobin A1c     Status: None   Collection Time: 07/18/16  4:22 PM  Result Value Ref Range   Hgb A1c MFr Bld 5.1 <5.7 %    Comment:   For the purpose of screening for the presence of diabetes:   <5.7%       Consistent with the absence of diabetes 5.7-6.4 %   Consistent with increased risk for diabetes (prediabetes) >=6.5 %     Consistent with diabetes   This assay result is consistent with a decreased risk of diabetes.   Currently, no consensus exists regarding use of hemoglobin A1c for diagnosis of diabetes in children.   According to American Diabetes Association (ADA) guidelines, hemoglobin A1c <7.0% represents optimal control in non-pregnant diabetic patients. Different metrics may apply to specific patient populations. Standards of Medical Care in Diabetes (ADA).      Mean Plasma Glucose 100 mg/dL  POCT rapid strep A     Status: None   Collection Time: 09/03/16  8:48 AM  Result Value Ref Range   Rapid Strep A Screen Negative Negative     Assessment & Plan  1. Sore throat - POCT rapid strep A - magic mouthwash w/lidocaine SOLN; Take 5 mLs by mouth 3 (three) times daily as needed for mouth pain.  Dispense: 50 mL; Refill: 0  2. Nasal congestion - Azelastine-Fluticasone 137-50 MCG/ACT SUSP; Place 2 sprays into the nose daily.  Dispense: 23 g; Refill: 0 - Will call if Dymista is too expensive, and can write for Flonase.  3. Cough - benzonatate (TESSALON PERLES) 100 MG capsule; Take 1 capsule (100 mg total) by mouth 3 (three) times daily as needed for cough.  Dispense: 30 capsule; Refill: 0  - Discussed likely a viral illness, basic self care including cool mist humidifier, Tylenol PRN for pain or fever not to exceed 3000mg /day, and  staying hydrated. Patient has 57mo son at home and hand hygiene is stressed to avoid transmission of illness. - Recommend she use Albuterol inhaler PRN for coughing with bronchospasm, and to re-start Symbicort daily, she is agreeable to restart medications. Rx's are up to date, however patient may call for refill if needed.  - Return if symptoms worsen or fail to improve, for 3-5 days . -Red flags and when to present for emergency care or RTC including fever >101.13F, chest pain, shortness of breath, new/worsening/un-resolving symptoms, reviewed with patient at time of visit. Follow up and care instructions discussed and provided in AVS.  I have reviewed this encounter including the documentation in this note and/or discussed this patient with the Deboraha Sprang, FNP, NP-C. I am certifying that I agree with the content of this note as supervising physician.  Alba Cory, MD [facility] 09/03/2016, 10:57 AM

## 2016-09-05 ENCOUNTER — Encounter: Payer: Self-pay | Admitting: Family Medicine

## 2016-09-05 ENCOUNTER — Ambulatory Visit (INDEPENDENT_AMBULATORY_CARE_PROVIDER_SITE_OTHER): Payer: BC Managed Care – PPO | Admitting: Family Medicine

## 2016-09-05 VITALS — BP 124/78 | HR 103 | Temp 98.1°F | Resp 16 | Ht 70.0 in | Wt 227.6 lb

## 2016-09-05 DIAGNOSIS — J4521 Mild intermittent asthma with (acute) exacerbation: Secondary | ICD-10-CM

## 2016-09-05 DIAGNOSIS — F331 Major depressive disorder, recurrent, moderate: Secondary | ICD-10-CM

## 2016-09-05 DIAGNOSIS — E669 Obesity, unspecified: Secondary | ICD-10-CM | POA: Diagnosis not present

## 2016-09-05 DIAGNOSIS — J029 Acute pharyngitis, unspecified: Secondary | ICD-10-CM

## 2016-09-05 DIAGNOSIS — E8881 Metabolic syndrome: Secondary | ICD-10-CM

## 2016-09-05 MED ORDER — FLUOXETINE HCL 90 MG PO CPDR
90.0000 mg | DELAYED_RELEASE_CAPSULE | ORAL | 0 refills | Status: DC
Start: 2016-09-05 — End: 2017-01-21

## 2016-09-05 MED ORDER — AMOXICILLIN-POT CLAVULANATE 875-125 MG PO TABS: 1.0000 | ORAL_TABLET | Freq: Two times a day (BID) | ORAL | 0 refills | Status: DC

## 2016-09-05 MED ORDER — BUDESONIDE-FORMOTEROL FUMARATE 160-4.5 MCG/ACT IN AERO: 2.0000 | INHALATION_SPRAY | Freq: Two times a day (BID) | RESPIRATORY_TRACT | 2 refills | Status: DC

## 2016-09-05 MED ORDER — MODAFINIL 100 MG PO TABS: 100.0000 mg | ORAL_TABLET | Freq: Every day | ORAL | 0 refills | Status: DC

## 2016-09-05 MED ORDER — ALBUTEROL SULFATE HFA 108 (90 BASE) MCG/ACT IN AERS: 2.0000 | INHALATION_SPRAY | RESPIRATORY_TRACT | 0 refills | Status: DC | PRN

## 2016-09-05 MED ORDER — SEMAGLUTIDE(0.25 OR 0.5MG/DOS) 2 MG/1.5ML ~~LOC~~ SOPN
0.5000 mg | PEN_INJECTOR | SUBCUTANEOUS | 2 refills | Status: DC
Start: 2016-09-05 — End: 2017-02-11

## 2016-09-05 NOTE — Progress Notes (Signed)
Name: Anna GreeningSarah Whitehead   MRN: [id number]    DOB: 08-22-1985   Date:09/05/2016       Progress Note  Subjective  Chief Complaint  Chief Complaint  Patient presents with  . Depression    6 week follow up    HPI  Depression: she has a long history of depression but got worse after the birth of her first son ( 10/2015 ), she has lack of energy, change in appetite and weight gain, getting snappy at work ( principal has noticed - she no longer has a positive attitude). She also feels very tired but not sure if secondary to having a new baby. She started seeing a therapist last week and is currently on Prozac but states symptoms are getting worse instead of improving. She was seen in our office March 2018 and we gave her Cymbalta, she states it caused nausea and made her feel even more tired, she was taking medication on and off but would like to try something different. She has difficulty with daily medication use, she has taken Prozac but states skipped many doses, we started weekly rx 6 weeks ago and is doing better, no longer as depressed but still has anhedonia.  She has not been seeing a counselor lately. She is on Summer break and stress is lower. She is still feeling overwhelmed and is not cleaning the house, but crying episode still present but not as often.  She has an understanding husband- but he also struggles with depression/anxiety, her parents are also supportive. We will try adding a stimulant. No side effects of long acting Prozac, she has lost weight since last visit.   Obesity: she has a long history of obesity but states stress makes her eat and causes her to gain weight that in turn causes more stress. Discussed options, and she is willing to have labs and will consider taking a GLP-1 agonist. She denies family history of MEN syndrome or medullary thyroid carcinoma. There is no personal history of pancreatitis.  She decreased wine intake, and has lost weight, mild insulin resistance we  will try stimulant and see if it will improve with weight, we can still try Ozempic if she desires  Sore throat: pain is getting worse, referring to left ear. She is having chills, seen 4 days ago, but not better  Asthma mild: she denies wheezing or SOB, cough recently secondary to URI/sore throat  Patient Active Problem List   Diagnosis Date Noted  . Depression, major, recurrent, moderate (HCC) 07/18/2016  . B12 deficiency 07/18/2016  . Vitamin D insufficiency 07/18/2016  . History of gestational hypertension 11/14/2015  . Asthma, mild 10/13/2014  . H/O high risk medication treatment 10/13/2014  . Overweight 10/13/2014  . Anxiety and depression 10/13/2014    Past Surgical History:  Procedure Laterality Date  . MYRINGOPLASTY    . NO PAST SURGERIES      Family History  Problem Relation Age of Onset  . Bipolar disorder Mother   . Cancer Maternal Aunt        3 of her aunts had breast cancer: 2 passed away    Social History   Social History  . Marital status: Married    Spouse name: N/A  . Number of children: N/A  . Years of education: N/A   Occupational History  . teacher    Social History Main Topics  . Smoking status: Never Smoker  . Smokeless tobacco: Never Used  . Alcohol use 0.0 oz/week  Comment: ocassionally  . Drug use: No  . Sexual activity: Yes    Partners: Male   Other Topics Concern  . Not on file   Social History Narrative   Married, first son was born 11/13/2016     Current Outpatient Prescriptions:  .  albuterol (VENTOLIN HFA) 108 (90 Base) MCG/ACT inhaler, Inhale 2 puffs into the lungs every 4 (four) hours as needed for wheezing or shortness of breath., Disp: 1 Inhaler, Rfl: 0 .  amoxicillin-clavulanate (AUGMENTIN) 875-125 MG tablet, Take 1 tablet by mouth 2 (two) times daily., Disp: 20 tablet, Rfl: 0 .  Azelastine-Fluticasone 137-50 MCG/ACT SUSP, Place 2 sprays into the nose daily., Disp: 23 g, Rfl: 0 .  benzonatate (TESSALON PERLES) 100  MG capsule, Take 1 capsule (100 mg total) by mouth 3 (three) times daily as needed for cough., Disp: 30 capsule, Rfl: 0 .  budesonide-formoterol (SYMBICORT) 160-4.5 MCG/ACT inhaler, Inhale 2 puffs into the lungs 2 (two) times daily., Disp: 10.2 Inhaler, Rfl: 2 .  Cholecalciferol (VITAMIN D) 2000 units CAPS, Take 1 capsule (2,000 Units total) by mouth daily. (Patient not taking: Reported on 09/03/2016), Disp: 30 capsule, Rfl: 0 .  FLUoxetine (PROZAC WEEKLY) 90 MG DR capsule, Take 1 capsule (90 mg total) by mouth every 7 (seven) days., Disp: 12 capsule, Rfl: 0 .  magic mouthwash w/lidocaine SOLN, Take 5 mLs by mouth 3 (three) times daily as needed for mouth pain., Disp: 50 mL, Rfl: 0 .  modafinil (PROVIGIL) 100 MG tablet, Take 1 tablet (100 mg total) by mouth daily., Disp: 30 tablet, Rfl: 0 .  OVER THE COUNTER MEDICATION, Take 1 tablet by mouth 2 (two) times daily. Patient takes over the counter Zantac twice a day for heartburn, Disp: , Rfl:   Allergies  Allergen Reactions  . Duloxetine Nausea Only    Dizziness      ROS  Constitutional: Negative for fever or significant weight change.  Respiratory: Negative for cough and shortness of breath.   Cardiovascular: Negative for chest pain or palpitations.  Gastrointestinal: Negative for abdominal pain, no bowel changes.  Musculoskeletal: Negative for gait problem or joint swelling.  Skin: Negative for rash.  Neurological: Negative for dizziness or headache.  No other specific complaints in a complete review of systems (except as listed in HPI above).  Objective  Vitals:   09/05/16 1107  BP: 124/78  Pulse: (!) 103  Resp: 16  Temp: 98.1 F (36.7 C)  SpO2: 95%  Weight: 227 lb 9 oz (103.2 kg)  Height: 5\' 10"  (1.778 m)    Body mass index is 32.65 kg/m.  Physical Exam  Constitutional: Patient appears well-developed and well-nourished. Obese  No distress.  HEENT: head atraumatic, normocephalic, pupils equal and reactive to light, neck  supple, throat within normal limits Cardiovascular: Normal rate, regular rhythm and normal heart sounds.  No murmur heard. No BLE edema. Pulmonary/Chest: Effort normal and breath sounds normal. No respiratory distress. Abdominal: Soft.  There is no tenderness. Psychiatric: Patient has a normal mood and affect. behavior is normal. Judgment and thought content normal.   Recent Results (from the past 2160 hour(s))  Insulin, random     Status: None   Collection Time: 07/18/16  4:22 PM  Result Value Ref Range   Insulin 5.6 2.0 - 19.6 uIU/mL    Comment: This insulin assay shows strong cross-reactivity for some insulin analogs (lispro, aspart, and glargine) and much lower cross-reactivity with others (detemir, glulisine).   Hemoglobin A1c  Status: None   Collection Time: 07/18/16  4:22 PM  Result Value Ref Range   Hgb A1c MFr Bld 5.1 <5.7 %    Comment:   For the purpose of screening for the presence of diabetes:   <5.7%       Consistent with the absence of diabetes 5.7-6.4 %   Consistent with increased risk for diabetes (prediabetes) >=6.5 %     Consistent with diabetes   This assay result is consistent with a decreased risk of diabetes.   Currently, no consensus exists regarding use of hemoglobin A1c for diagnosis of diabetes in children.   According to American Diabetes Association (ADA) guidelines, hemoglobin A1c <7.0% represents optimal control in non-pregnant diabetic patients. Different metrics may apply to specific patient populations. Standards of Medical Care in Diabetes (ADA).      Mean Plasma Glucose 100 mg/dL  POCT rapid strep A     Status: None   Collection Time: 09/03/16  8:48 AM  Result Value Ref Range   Rapid Strep A Screen Negative Negative     PHQ2/9: Depression screen [facility] 2/9 09/05/2016 06/05/2016 10/13/2014  Decreased Interest 1 2 1   Down, Depressed, Hopeless 1 2 1   PHQ - 2 Score 2 4 2   Altered sleeping 0 2 1  Tired, decreased energy 0 3 1  Change in  appetite 0 2 1  Feeling bad or failure about yourself  0 3 2  Trouble concentrating 0 2 1  Moving slowly or fidgety/restless 0 0 0  Suicidal thoughts 0 0 0  PHQ-9 Score 2 16 8   Difficult doing work/chores Somewhat difficult Somewhat difficult Somewhat difficult     Fall Risk: Fall Risk  10/13/2014  Falls in the past year? No     Assessment & Plan  1. Depression, major, recurrent, moderate (HCC)  - modafinil (PROVIGIL) 100 MG tablet; Take 1 tablet (100 mg total) by mouth daily.  Dispense: 30 tablet; Refill: 0 - FLUoxetine (PROZAC WEEKLY) 90 MG DR capsule; Take 1 capsule (90 mg total) by mouth every 7 (seven) days.  Dispense: 12 capsule; Refill: 0  2. Sore throat  - amoxicillin-clavulanate (AUGMENTIN) 875-125 MG tablet; Take 1 tablet by mouth 2 (two) times daily.  Dispense: 20 tablet; Refill: 0  3. Asthma in adult, mild intermittent, with acute exacerbation  - albuterol (VENTOLIN HFA) 108 (90 Base) MCG/ACT inhaler; Inhale 2 puffs into the lungs every 4 (four) hours as needed for wheezing or shortness of breath.  Dispense: 1 Inhaler; Refill: 0 - budesonide-formoterol (SYMBICORT) 160-4.5 MCG/ACT inhaler; Inhale 2 puffs into the lungs 2 (two) times daily.  Dispense: 10.2 Inhaler; Refill: 2  4. Obesity (BMI 30.0-34.9)  Discussed possible side effects , we gave her the first dose in our office today, she denies personal history of pancreatitis or family history of thyroid cancer - Semaglutide (OZEMPIC) 0.25 or 0.5 MG/DOSE SOPN; Inject 0.5 mg into the skin once a week.  Dispense: 2 pen; Refill: 2  5. Insulin resistance  - Semaglutide (OZEMPIC) 0.25 or 0.5 MG/DOSE SOPN; Inject 0.5 mg into the skin once a week.  Dispense: 2 pen; Refill: 2

## 2016-09-30 ENCOUNTER — Other Ambulatory Visit: Payer: Self-pay | Admitting: Family Medicine

## 2016-09-30 DIAGNOSIS — R0981 Nasal congestion: Secondary | ICD-10-CM

## 2016-10-22 ENCOUNTER — Other Ambulatory Visit: Payer: Self-pay | Admitting: Family Medicine

## 2016-10-22 DIAGNOSIS — R0981 Nasal congestion: Secondary | ICD-10-CM

## 2016-12-09 ENCOUNTER — Ambulatory Visit: Payer: BC Managed Care – PPO | Admitting: Family Medicine

## 2017-01-21 ENCOUNTER — Telehealth: Payer: Self-pay | Admitting: Family Medicine

## 2017-01-21 DIAGNOSIS — F331 Major depressive disorder, recurrent, moderate: Secondary | ICD-10-CM

## 2017-01-21 NOTE — Telephone Encounter (Signed)
Refill request for general medication: Prozac  Last office visit: 09/05/2016  Last physical exam: None indicated  Follow up scheduled: None indicated

## 2017-01-22 NOTE — Telephone Encounter (Signed)
LVM to inform pt of refill and to schedule an appt

## 2017-02-11 ENCOUNTER — Ambulatory Visit: Payer: BC Managed Care – PPO | Admitting: Family Medicine

## 2017-02-11 ENCOUNTER — Encounter: Payer: Self-pay | Admitting: Family Medicine

## 2017-02-11 VITALS — BP 108/72 | HR 75 | Temp 97.9°F | Resp 16 | Ht 70.0 in | Wt 234.6 lb

## 2017-02-11 DIAGNOSIS — R059 Cough, unspecified: Secondary | ICD-10-CM

## 2017-02-11 DIAGNOSIS — R05 Cough: Secondary | ICD-10-CM | POA: Diagnosis not present

## 2017-02-11 DIAGNOSIS — K219 Gastro-esophageal reflux disease without esophagitis: Secondary | ICD-10-CM | POA: Diagnosis not present

## 2017-02-11 DIAGNOSIS — J4520 Mild intermittent asthma, uncomplicated: Secondary | ICD-10-CM

## 2017-02-11 MED ORDER — ALBUTEROL SULFATE HFA 108 (90 BASE) MCG/ACT IN AERS: 2.0000 | INHALATION_SPRAY | RESPIRATORY_TRACT | 0 refills | Status: DC | PRN

## 2017-02-11 MED ORDER — RANITIDINE HCL 150 MG PO TABS: 150.0000 mg | ORAL_TABLET | Freq: Two times a day (BID) | ORAL | 0 refills | Status: DC

## 2017-02-11 MED ORDER — BUDESONIDE-FORMOTEROL FUMARATE 160-4.5 MCG/ACT IN AERO: 2.0000 | INHALATION_SPRAY | Freq: Two times a day (BID) | RESPIRATORY_TRACT | 2 refills | Status: DC

## 2017-02-11 NOTE — Progress Notes (Signed)
Name: Anna GreeningSarah Bringhurst   MRN: [id number]    DOB: 05-23-1985   Date:02/11/2017       Progress Note  Subjective  Chief Complaint  Chief Complaint  Patient presents with  . Asthma    States she only used her inhalers as needed in the past and does not know where they are. So patient was unable to use them due to her symptoms. Patient has been coughing to the point of her throwing up.     HPI  Patient notes progressively worsening coughing (especially coughing while eating), has also started coughing so hard that she is vomiting (often after she lays down in bed, typically has food content).  Denies shortness of breath outside of coughing episodes, no chest pain, palpitations, no wheezing.  Occasaional heartburn, worse when laying down.  Eats about 7:30 and goes to bed about 9:00pm.  She will sometimes takes ranitidine, but this is after symptoms have started - she is willing to try this BID before trying PPI class. She was prescribed symbicort and albuterol and never started taking symbicort or albuterol inhaler.  Patient Active Problem List   Diagnosis Date Noted  . Depression, major, recurrent, moderate (HCC) 07/18/2016  . B12 deficiency 07/18/2016  . Vitamin D insufficiency 07/18/2016  . History of gestational hypertension 11/14/2015  . Asthma, mild 10/13/2014  . H/O high risk medication treatment 10/13/2014  . Overweight 10/13/2014  . Anxiety and depression 10/13/2014    Social History   Tobacco Use  . Smoking status: Never Smoker  . Smokeless tobacco: Never Used  Substance Use Topics  . Alcohol use: Yes    Alcohol/week: 0.0 oz    Comment: ocassionally     Current Outpatient Medications:  .  albuterol (VENTOLIN HFA) 108 (90 Base) MCG/ACT inhaler, Inhale 2 puffs into the lungs every 4 (four) hours as needed for wheezing or shortness of breath., Disp: 1 Inhaler, Rfl: 0 .  amoxicillin-clavulanate (AUGMENTIN) 875-125 MG tablet, Take 1 tablet by mouth 2 (two) times daily. (Patient  not taking: Reported on 02/11/2017), Disp: 20 tablet, Rfl: 0 .  benzonatate (TESSALON PERLES) 100 MG capsule, Take 1 capsule (100 mg total) by mouth 3 (three) times daily as needed for cough. (Patient not taking: Reported on 02/11/2017), Disp: 30 capsule, Rfl: 0 .  budesonide-formoterol (SYMBICORT) 160-4.5 MCG/ACT inhaler, Inhale 2 puffs into the lungs 2 (two) times daily., Disp: 10.2 Inhaler, Rfl: 2 .  Cholecalciferol (VITAMIN D) 2000 units CAPS, Take 1 capsule (2,000 Units total) by mouth daily. (Patient not taking: Reported on 09/03/2016), Disp: 30 capsule, Rfl: 0 .  DYMISTA 137-50 MCG/ACT SUSP, PLACE 2 SPRAYS INTO THE NOSE DAILY. (Patient not taking: Reported on 02/11/2017), Disp: 23 g, Rfl: 2 .  FLUoxetine (PROZAC WEEKLY) 90 MG DR capsule, Take 1 capsule (90 mg total) every 7 (seven) days by mouth. (Patient not taking: Reported on 02/11/2017), Disp: 4 capsule, Rfl: 0 .  magic mouthwash w/lidocaine SOLN, Take 5 mLs by mouth 3 (three) times daily as needed for mouth pain. (Patient not taking: Reported on 02/11/2017), Disp: 50 mL, Rfl: 0 .  modafinil (PROVIGIL) 100 MG tablet, Take 1 tablet (100 mg total) by mouth daily. (Patient not taking: Reported on 02/11/2017), Disp: 30 tablet, Rfl: 0 .  OVER THE COUNTER MEDICATION, Take 1 tablet by mouth 2 (two) times daily. Patient takes over the counter Zantac twice a day for heartburn, Disp: , Rfl:  .  Semaglutide (OZEMPIC) 0.25 or 0.5 MG/DOSE SOPN, Inject 0.5 mg into the  skin once a week. (Patient not taking: Reported on 02/11/2017), Disp: 2 pen, Rfl: 2  Allergies  Allergen Reactions  . Duloxetine Nausea Only    Dizziness     ROS  Constitutional: Negative for fever or weight change.  Respiratory: See HPI Cardiovascular: Negative for chest pain or palpitations.  Gastrointestinal: Negative for abdominal pain, no bowel changes - no blood in stool, dark/tarry stools.  Musculoskeletal: Negative for gait problem or joint swelling.  Skin: Negative for  rash.  Neurological: Negative for dizziness or headache.  No other specific complaints in a complete review of systems (except as listed in HPI above).  Objective  Vitals:   02/11/17 1521  BP: 108/72  Pulse: 75  Resp: 16  Temp: 97.9 F (36.6 C)  TempSrc: Oral  SpO2: 96%  Weight: 234 lb 9.6 oz (106.4 kg)  Height: 5\' 10"  (1.778 m)   Body mass index is 33.66 kg/m.  Nursing Note and Vital Signs reviewed.  Physical Exam  Constitutional: Patient appears well-developed and well-nourished. Obese. No distress.  HEENT: head atraumatic, normocephalic Cardiovascular: Normal rate, regular rhythm, S1/S2 present.  No murmur or rub heard. No BLE edema. Pulmonary/Chest: Effort normal and breath sounds clear. No respiratory distress or retractions. Abdominal: Soft and non-tender, bowel sounds present x4 quadrants. Psychiatric: Patient has a normal mood and affect. behavior is normal. Judgment and thought content normal.  No results found for this or any previous visit (from the past 2160 hour(s)).   Assessment & Plan  1. Cough - ranitidine (ZANTAC) 150 MG tablet; Take 1 tablet (150 mg total) by mouth 2 (two) times daily.  Dispense: 90 tablet; Refill: 0 - budesonide-formoterol (SYMBICORT) 160-4.5 MCG/ACT inhaler; Inhale 2 puffs into the lungs 2 (two) times daily.  Dispense: 10.2 Inhaler; Refill: 2 - albuterol (VENTOLIN HFA) 108 (90 Base) MCG/ACT inhaler; Inhale 2 puffs into the lungs every 4 (four) hours as needed for wheezing or shortness of breath.  Dispense: 1 Inhaler; Refill: 0  2. Mild intermittent asthma, unspecified whether complicated - budesonide-formoterol (SYMBICORT) 160-4.5 MCG/ACT inhaler; Inhale 2 puffs into the lungs 2 (two) times daily.  Dispense: 10.2 Inhaler; Refill: 2 - albuterol (VENTOLIN HFA) 108 (90 Base) MCG/ACT inhaler; Inhale 2 puffs into the lungs every 4 (four) hours as needed for wheezing or shortness of breath.  Dispense: 1 Inhaler; Refill: 0  3.  Gastroesophageal reflux disease without esophagitis - ranitidine (ZANTAC) 150 MG tablet; Take 1 tablet (150 mg total) by mouth 2 (two) times daily.  Dispense: 90 tablet; Refill: 0  - We will treat from both angles of GERD and Asthma, ensure good medication adherence and lifestyle modifications including eating >3 hours before laying down, eating small frequent meals, and avoiding trigger foods as outline in AVS.  Use Albuterol inhaler PRN for shortness of breath.    -Red flags and when to present for emergency care or RTC including fever >101.43F, chest pain, shortness of breath unrelieved by inhalers, new/worsening/un-resolving symptoms, reviewed with patient at time of visit. Follow up and care instructions discussed and provided in AVS.

## 2017-02-11 NOTE — Patient Instructions (Addendum)
Food Choices for Gastroesophageal Reflux Disease, Adult When you have gastroesophageal reflux disease (GERD), the foods you eat and your eating habits are very important. Choosing the right foods can help ease your discomfort. What guidelines do I need to follow?  Choose fruits, vegetables, whole grains, and low-fat dairy products.  Choose low-fat meat, fish, and poultry.  Limit fats such as oils, salad dressings, butter, nuts, and avocado.  Keep a food diary. This helps you identify foods that cause symptoms.  Avoid foods that cause symptoms. These may be different for everyone.  Eat small meals often instead of 3 large meals a day.  Eat your meals slowly, in a place where you are relaxed.  Limit fried foods.  Cook foods using methods other than frying.  Avoid drinking alcohol.  Avoid drinking large amounts of liquids with your meals.  Avoid bending over or lying down until 2-3 hours after eating. What foods are not recommended? These are some foods and drinks that may make your symptoms worse: Vegetables Tomatoes. Tomato juice. Tomato and spaghetti sauce. Chili peppers. Onion and garlic. Horseradish. Fruits Oranges, grapefruit, and lemon (fruit and juice). Meats High-fat meats, fish, and poultry. This includes hot dogs, ribs, ham, sausage, salami, and bacon. Dairy Whole milk and chocolate milk. Sour cream. Cream. Butter. Ice cream. Cream cheese. Drinks Coffee and tea. Bubbly (carbonated) drinks or energy drinks. Condiments Hot sauce. Barbecue sauce. Sweets/Desserts Chocolate and cocoa. Donuts. Peppermint and spearmint. Fats and Oils High-fat foods. This includes [place] fries and potato chips. Other Vinegar. Strong spices. This includes black pepper, white pepper, red pepper, cayenne, curry powder, cloves, ginger, and chili powder. The items listed above may not be a complete list of foods and drinks to avoid. Contact your dietitian for more information. This  information is not intended to replace advice given to you by your health care provider. Make sure you discuss any questions you have with your health care provider. Document Released: 09/02/2011 Document Revised: 08/09/2015 Document Reviewed: 01/05/2013 Elsevier Interactive Patient Education  2017 Elsevier Inc.  Asthma, Adult Asthma is a condition of the lungs in which the airways tighten and narrow. Asthma can make it hard to breathe. Asthma cannot be cured, but medicine and lifestyle changes can help control it. Asthma may be started (triggered) by:  Animal skin flakes (dander).  Dust.  Cockroaches.  Pollen.  Mold.  Smoke.  Cleaning products.  Hair sprays or aerosol sprays.  Paint fumes or strong smells.  Cold air, weather changes, and winds.  Crying or laughing hard.  Stress.  Certain medicines or drugs.  Foods, such as dried fruit, potato chips, and sparkling grape juice.  Infections or conditions (colds, flu).  Exercise.  Certain medical conditions or diseases.  Exercise or tiring activities.  Follow these instructions at home:  Take medicine as told by your doctor.  Use a peak flow meter as told by your doctor. A peak flow meter is a tool that measures how well the lungs are working.  Record and keep track of the peak flow meter's readings.  Understand and use the asthma action plan. An asthma action plan is a written plan for taking care of your asthma and treating your attacks.  To help prevent asthma attacks: ? Do not smoke. Stay away from secondhand smoke. ? Change your heating and air conditioning filter often. ? Limit your use of fireplaces and wood stoves. ? Get rid of pests (such as roaches and mice) and their droppings. ? Throw away plants  if you see mold on them. ? Clean your floors. Dust regularly. Use cleaning products that do not smell. ? Have someone vacuum when you are not home. Use a vacuum cleaner with a HEPA filter if  possible. ? Replace carpet with wood, tile, or vinyl flooring. Carpet can trap animal skin flakes and dust. ? Use allergy-proof pillows, mattress covers, and box spring covers. ? Wash bed sheets and blankets every week in hot water and dry them in a dryer. ? Use blankets that are made of polyester or cotton. ? Clean bathrooms and kitchens with bleach. If possible, have someone repaint the walls in these rooms with mold-resistant paint. Keep out of the rooms that are being cleaned and painted. ? Wash hands often. Contact a doctor if:  You have make a whistling sound when breaking (wheeze), have shortness of breath, or have a cough even if taking medicine to prevent attacks.  The colored mucus you cough up (sputum) is thicker than usual.  The colored mucus you cough up changes from clear or white to yellow, green, gray, or bloody.  You have problems from the medicine you are taking such as: ? A rash. ? Itching. ? Swelling. ? Trouble breathing.  You need reliever medicines more than 2-3 times a week.  Your peak flow measurement is still at 50-79% of your personal best after following the action plan for 1 hour.  You have a fever. Get help right away if:  You seem to be worse and are not responding to medicine during an asthma attack.  You are short of breath even at rest.  You get short of breath when doing very little activity.  You have trouble eating, drinking, or talking.  You have chest pain.  You have a fast heartbeat.  Your lips or fingernails start to turn blue.  You are light-headed, dizzy, or faint.  Your peak flow is less than 50% of your personal best. This information is not intended to replace advice given to you by your health care provider. Make sure you discuss any questions you have with your health care provider. Document Released: 08/20/2007 Document Revised: 08/09/2015 Document Reviewed: 09/30/2012 Elsevier Interactive Patient Education  2017 [employer]

## 2017-02-12 NOTE — Telephone Encounter (Signed)
Copied from CRM [phone]. Topic: Quick Communication - See Telephone Encounter >> Feb 12, 2017  4:12 PM Terisa Starraylor, Brittany L wrote: CRM for notification. See Telephone encounter for:  Pt called in and said the scripts for SYMBICORT, ALBUTEROL & ZANTAC were all sent to the wrong pharmacy. She had 3 on file & was not using any of them. She updated the info with me & needs it re-sent [facility] in [place].   02/12/17.

## 2017-02-13 NOTE — Telephone Encounter (Signed)
Patient medications were transferred last night to [facility] and spoke with Joni Reiningicole there to confirm they received all three of them. Left voicemail to notify patient of prescription at [facility] to call Joni Reiningicole to let her know either way when she will be picking them up or if she just needs to put them on her profile.

## 2017-02-23 ENCOUNTER — Telehealth: Payer: Self-pay | Admitting: Family Medicine

## 2017-02-23 NOTE — Telephone Encounter (Signed)
Copied from CRM [id number]. Topic: General - Other >> Feb 23, 2017 11:32 AM Cecelia ByarsGreen, Marquee Fuchs L, RMA wrote: Reason for CRM: patient called to notify Anna Whitehead that she has been experiencing some severe stomach pain today and you wanted to know if this is a side affect of GERD, please call pt

## 2017-02-24 ENCOUNTER — Ambulatory Visit: Payer: Self-pay | Admitting: Family Medicine

## 2017-02-24 ENCOUNTER — Encounter: Payer: Self-pay | Admitting: Family Medicine

## 2017-02-24 ENCOUNTER — Ambulatory Visit: Payer: BC Managed Care – PPO | Admitting: Family Medicine

## 2017-02-24 VITALS — BP 112/74 | HR 81 | Temp 98.9°F | Resp 16 | Ht 70.0 in | Wt 236.2 lb

## 2017-02-24 DIAGNOSIS — F88 Other disorders of psychological development: Secondary | ICD-10-CM

## 2017-02-24 DIAGNOSIS — R1084 Generalized abdominal pain: Secondary | ICD-10-CM | POA: Diagnosis not present

## 2017-02-24 DIAGNOSIS — R10822 Left upper quadrant rebound abdominal tenderness: Secondary | ICD-10-CM

## 2017-02-24 MED ORDER — DICYCLOMINE HCL 20 MG PO TABS: 20.0000 mg | ORAL_TABLET | Freq: Four times a day (QID) | ORAL | 0 refills | Status: DC | PRN

## 2017-02-24 NOTE — Telephone Encounter (Signed)
Per the request of Maurice Smallmily boyce, FNP, I contacted this patient to see how she was doing and she said that she was feeling a  a little better after she took 800mg  ibuprofen.  She was asked if she was having any vomiting, diarrhea, or change in bowel movement and she said yes. Patient was given an appt for today.

## 2017-02-24 NOTE — Telephone Encounter (Signed)
Please call and ask patient how she is feeling. Please  advise patient that if she is experiencing severe abdominal pain, she needs to present for Emergency Care.

## 2017-02-24 NOTE — Progress Notes (Signed)
Name: Anna Whitehead   MRN: [id number]    DOB: October 24, 1985   Date:02/24/2017       Progress Note  Subjective  Chief Complaint  Chief Complaint  Patient presents with  . GI Problem    HPI Pt presents with concern for abdominal pain.  Yesterday had significant pain that felt like severe lower abdominal cramping. Having diarrhea x1-2 days, had one episode of vomiting yesterday, endorses lack of appetite.  Has been taking ibuprofen for the last day - this has been helping her pain and she is feeling better today (now describes pain as a dull ache).  Denies blood in stool, no blood in vomitus, no abnormal vaginal bleeding (has IUD), nausea, fevers/chills, dysuria, frequency/urgency. No prior abdominal surgeries, no hx mono or pancreatitis.  GERD - Coughing has been improving since last visit - has been noting triggers and adjusting her diet along with having small more frequent meals.  Sensory Processing Issues: She notes for most of her life she has had difficulty using certain products due to their texture etc such as cotton balls, dry tissues, certain fabrics, even certain lotions. This has gotten worse since having her first child (now 15mos old). She has never seen a health care provider about this issue in the past, and wanted to mention it today.  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  Patient Active Problem List   Diagnosis Date Noted  . Depression, major, recurrent, moderate (HCC) 07/18/2016  . B12 deficiency 07/18/2016  . Vitamin D insufficiency 07/18/2016  . History of gestational hypertension 11/14/2015  . Asthma, mild 10/13/2014  . H/O high risk medication treatment 10/13/2014  . Overweight 10/13/2014  . Anxiety and depression 10/13/2014    Social History   Tobacco Use  . Smoking status: Never Smoker  . Smokeless tobacco: Never Used  Substance Use Topics  . Alcohol use: Yes    Alcohol/week: 0.0 oz    Comment: ocassionally     Current Outpatient  Medications:  .  albuterol (VENTOLIN HFA) 108 (90 Base) MCG/ACT inhaler, Inhale 2 puffs into the lungs every 4 (four) hours as needed for wheezing or shortness of breath., Disp: 1 Inhaler, Rfl: 0 .  budesonide-formoterol (SYMBICORT) 160-4.5 MCG/ACT inhaler, Inhale 2 puffs into the lungs 2 (two) times daily., Disp: 10.2 Inhaler, Rfl: 2 .  OVER THE COUNTER MEDICATION, Take 1 tablet by mouth 2 (two) times daily. Patient takes over the counter Zantac twice a day for heartburn, Disp: , Rfl:  .  ranitidine (ZANTAC) 150 MG tablet, Take 1 tablet (150 mg total) by mouth 2 (two) times daily., Disp: 90 tablet, Rfl: 0  Allergies  Allergen Reactions  . Duloxetine Nausea Only    Dizziness     ROS  Constitutional: Negative for fever or weight change.  Respiratory: Negative for cough and shortness of breath.   Cardiovascular: Negative for chest pain or palpitations.  Gastrointestinal: See HPI Musculoskeletal: Negative for gait problem or joint swelling.  Skin: Negative for rash.  Neurological: Negative for dizziness/lightheadedness or headache.  No other specific complaints in a complete review of systems (except as listed in HPI above).  Objective  Vitals:   02/24/17 1302  BP: 112/74  Pulse: 81  Resp: 16  Temp: 98.9 F (37.2 C)  TempSrc: Oral  SpO2: 99%  Weight: 236 lb 3.2 oz (107.1 kg)  Height: 5\' 10"  (1.778 m)   Body mass index is 33.89 kg/m.  Nursing Note and Vital Signs reviewed.  Physical  Exam  Constitutional: Patient appears well-developed and well-nourished. Obese No distress.  HEENT: head atraumatic, normocephalic, neck supple without lymphadenopathy, oropharynx pink and moist without exudate Cardiovascular: Normal rate, regular rhythm, S1/S2 present.  No murmur or rub heard. No BLE edema. Pulmonary/Chest: Effort normal and breath sounds clear. No respiratory distress or retractions. Abdominal: Soft and tender to LUQ, bowel sounds present x4 quadrants.  No CVA  Tenderness Psychiatric: Patient has a normal mood and affect. behavior is normal. Judgment and thought content normal.  No results found for this or any previous visit (from the past 2160 hour(s)).   Assessment & Plan  1. Generalized abdominal cramping - COMPLETE METABOLIC PANEL WITH GFR - CBC w/Diff/Platelet - Lipase - dicyclomine (BENTYL) 20 MG tablet; Take 1 tablet (20 mg total) by mouth every 6 (six) hours as needed for spasms.  Dispense: 30 tablet; Refill: 0  2. Left upper quadrant abdominal tenderness with rebound tenderness - COMPLETE METABOLIC PANEL WITH GFR - CBC w/Diff/Platelet - Lipase - H. pylori breath test - Discussed possibilities of imaging at length with the patient, it is mutually agreed to await lab testing and to give illness time to take its course if it is a viral GI illness. Advised if not improving in 3-5 days, we will consider LUQ US or CT scan based on her symptoms.  3. Sensory processing difficulty - Ambulatory referral to Psychology  -Red flags and when to present for emergency care or RTC including fever >101.60F, chest pain, shortness of breath, new/worsening/un-resolving symptoms, blood in stool/dark and tarry stools/ reviewed with patient at time of visit. Follow up and care instructions discussed and provided in AVS.

## 2017-02-24 NOTE — Patient Instructions (Addendum)

## 2017-02-25 ENCOUNTER — Telehealth: Payer: Self-pay

## 2017-02-25 NOTE — Telephone Encounter (Signed)
Called pt informed her of the information below. Pt gave verbal understanding.  

## 2017-02-25 NOTE — Telephone Encounter (Signed)
-----   Message from Doren CustardEmily E Boyce, FNP sent at 02/25/2017 10:12 AM EST ----- Normal CMP, CBC, and Lipase. H pylori is pending. Thanks!

## 2017-02-26 LAB — H. PYLORI BREATH TEST: H. pylori Breath Test: NOT DETECTED

## 2017-02-26 LAB — CBC WITH DIFFERENTIAL/PLATELET
Basophils Absolute: 33 cells/uL (ref 0–200)
Basophils Relative: 0.5 %
EOS PCT: 7.6 %
Eosinophils Absolute: 502 cells/uL — ABNORMAL HIGH (ref 15–500)
HEMATOCRIT: 41.5 % (ref 35.0–45.0)
HEMOGLOBIN: 13.7 g/dL (ref 11.7–15.5)
LYMPHS ABS: 1729 {cells}/uL (ref 850–3900)
MCH: 29.7 pg (ref 27.0–33.0)
MCHC: 33 g/dL (ref 32.0–36.0)
MCV: 89.8 fL (ref 80.0–100.0)
MPV: 10.4 fL (ref 7.5–12.5)
Monocytes Relative: 4.5 %
NEUTROS ABS: 4039 {cells}/uL (ref 1500–7800)
Neutrophils Relative %: 61.2 %
Platelets: 222 10*3/uL (ref 140–400)
RBC: 4.62 10*6/uL (ref 3.80–5.10)
RDW: 12.7 % (ref 11.0–15.0)
Total Lymphocyte: 26.2 %
WBC mixed population: 297 cells/uL (ref 200–950)
WBC: 6.6 10*3/uL (ref 3.8–10.8)

## 2017-02-26 LAB — COMPLETE METABOLIC PANEL WITH GFR
AG Ratio: 1.8 (calc) (ref 1.0–2.5)
ALKALINE PHOSPHATASE (APISO): 62 U/L (ref 33–115)
ALT: 10 U/L (ref 6–29)
AST: 14 U/L (ref 10–30)
Albumin: 4.3 g/dL (ref 3.6–5.1)
BILIRUBIN TOTAL: 0.3 mg/dL (ref 0.2–1.2)
BUN: 9 mg/dL (ref 7–25)
CHLORIDE: 102 mmol/L (ref 98–110)
CO2: 30 mmol/L (ref 20–32)
Calcium: 9.2 mg/dL (ref 8.6–10.2)
Creat: 0.74 mg/dL (ref 0.50–1.10)
GFR, Est African American: 127 mL/min/{1.73_m2} (ref >=60)
GFR, Est Non African American: 109 mL/min/{1.73_m2} (ref >=60)
GLUCOSE: 82 mg/dL (ref 65–99)
Globulin: 2.4 g/dL (calc) (ref 1.9–3.7)
Potassium: 4.7 mmol/L (ref 3.5–5.3)
Sodium: 138 mmol/L (ref 135–146)
Total Protein: 6.7 g/dL (ref 6.1–8.1)

## 2017-02-26 LAB — LIPASE: LIPASE: 17 U/L (ref 7–60)

## 2017-03-05 ENCOUNTER — Telehealth: Payer: Self-pay | Admitting: Family Medicine

## 2017-03-05 NOTE — Telephone Encounter (Signed)
She will need to be seen to have evaluation for these symptoms.

## 2017-03-05 NOTE — Telephone Encounter (Signed)
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 She did schedule an appointment for Monday morning.

## 2017-03-05 NOTE — Telephone Encounter (Signed)
Copied from CRM [id number]. Topic: Quick Communication - See Telephone Encounter >> Mar 05, 2017  2:11 PM Guinevere FerrariMorris, Gerasimos Plotts E, NT wrote: CRM for notification. See Telephone encounter for: Pt called in and said she has been having real bad coughing spells at night and was wanting to speak to the doctor or nurse about it. Pt would like a call back.  03/05/17.

## 2017-03-09 ENCOUNTER — Ambulatory Visit: Payer: BC Managed Care – PPO | Admitting: Family Medicine

## 2017-03-09 ENCOUNTER — Encounter: Payer: Self-pay | Admitting: Family Medicine

## 2017-03-09 VITALS — BP 108/62 | HR 96 | Temp 98.5°F | Resp 16 | Ht 70.0 in | Wt 231.0 lb

## 2017-03-09 DIAGNOSIS — J9801 Acute bronchospasm: Secondary | ICD-10-CM

## 2017-03-09 DIAGNOSIS — K219 Gastro-esophageal reflux disease without esophagitis: Secondary | ICD-10-CM

## 2017-03-09 DIAGNOSIS — R059 Cough, unspecified: Secondary | ICD-10-CM

## 2017-03-09 DIAGNOSIS — R05 Cough: Secondary | ICD-10-CM | POA: Diagnosis not present

## 2017-03-09 DIAGNOSIS — J4520 Mild intermittent asthma, uncomplicated: Secondary | ICD-10-CM | POA: Diagnosis not present

## 2017-03-09 MED ORDER — AZITHROMYCIN 250 MG PO TABS: ORAL_TABLET | ORAL | 0 refills | Status: DC

## 2017-03-09 MED ORDER — FLUTICASONE FUROATE-VILANTEROL 200-25 MCG/INH IN AEPB: 1.0000 | INHALATION_SPRAY | Freq: Every day | RESPIRATORY_TRACT | 1 refills | Status: DC

## 2017-03-09 NOTE — Progress Notes (Signed)
Name: Anna Whitehead   MRN: [id number]    DOB: 25-Dec-1985   Date:03/09/2017       Progress Note  Subjective  Chief Complaint  Chief Complaint  Patient presents with  . Cough    Onset-couple of months, intermittently. States her coughing will lead to her vomiting and the inhalers she has for her asthma does not help. She will have a sore throat and hurts to swallow and then the coughing follows it. Patient has been doing the cough and cold medication for it but after 6 hours symptoms are back.    HPI  Pt presents with concern for ongoing cough.  She has been working diligently on avoiding GERD triggers, eating better, and taking Ranitidine daily as prescribed.  She notes getting into coughing fits so significant that she vomits, new onset sore throat x2 weeks.  Endorses some rhinorrhea and occasional chest tightness.  Denies fevers/chills, abdominal pain, no nausea, diarrhea, constipation.  She is using Symbicort BID as prescribed, Albuterol PRN (She notes that Albuterol doesn't typically help the paroxysmal coughing).  Pt was seen 02/24/2017 for abdominal cramping and pain, and this has since resolved. Denies regurgitation or worsening of symptoms at night.  Patient Active Problem List   Diagnosis Date Noted  . Depression, major, recurrent, moderate (HCC) 07/18/2016  . B12 deficiency 07/18/2016  . Vitamin D insufficiency 07/18/2016  . History of gestational hypertension 11/14/2015  . Asthma, mild 10/13/2014  . H/O high risk medication treatment 10/13/2014  . Overweight 10/13/2014  . Anxiety and depression 10/13/2014    Social History   Tobacco Use  . Smoking status: Never Smoker  . Smokeless tobacco: Never Used  Substance Use Topics  . Alcohol use: Yes    Alcohol/week: 0.0 oz    Comment: ocassionally     Current Outpatient Medications:  .  albuterol (VENTOLIN HFA) 108 (90 Base) MCG/ACT inhaler, Inhale 2 puffs into the lungs every 4 (four) hours as needed for wheezing or  shortness of breath., Disp: 1 Inhaler, Rfl: 0 .  budesonide-formoterol (SYMBICORT) 160-4.5 MCG/ACT inhaler, Inhale 2 puffs into the lungs 2 (two) times daily., Disp: 10.2 Inhaler, Rfl: 2 .  dicyclomine (BENTYL) 20 MG tablet, Take 1 tablet (20 mg total) by mouth every 6 (six) hours as needed for spasms., Disp: 30 tablet, Rfl: 0 .  OVER THE COUNTER MEDICATION, Take 1 tablet by mouth 2 (two) times daily. Patient takes over the counter Zantac twice a day for heartburn, Disp: , Rfl:  .  ranitidine (ZANTAC) 150 MG tablet, Take 1 tablet (150 mg total) by mouth 2 (two) times daily., Disp: 90 tablet, Rfl: 0  Allergies  Allergen Reactions  . Duloxetine Nausea Only    Dizziness     ROS Constitutional: Negative for fever or weight change.  Respiratory: Positive for cough; negative for shortness of breath.   Cardiovascular: Negative for chest pain or palpitations.  Gastrointestinal: Negative for abdominal pain, no bowel changes.  Musculoskeletal: Negative for gait problem or joint swelling.  Skin: Negative for rash.  Neurological: Negative for dizziness or headache.  No other specific complaints in a complete review of systems (except as listed in HPI above).  Objective  Vitals:   03/09/17 0805  BP: 108/62  Pulse: 96  Resp: 16  Temp: 98.5 F (36.9 C)  TempSrc: Oral  SpO2: 99%  Weight: 231 lb (104.8 kg)  Height: 5\' 10"  (1.778 m)   Body mass index is 33.15 kg/m.  Nursing Note and Vital Signs reviewed.  Physical Exam  Constitutional: Patient appears well-developed and well-nourished. Obese No distress.  HEENT: head atraumatic, normocephalic, TM's without erythema or bulging, no maxillary or frontal sinus pain on palpation, neck supple without lymphadenopathy, oropharynx pink and moist without exudate Cardiovascular: Normal rate, regular rhythm, S1/S2 present.  No murmur or rub heard. No BLE edema. Pulmonary/Chest: Effort normal and breath sounds clear with very slight diminishment in  the bilateral bases. No respiratory distress or retractions. Psychiatric: Patient has a normal mood and affect. behavior is normal. Judgment and thought content normal.  Recent Results (from the past 2160 hour(s))  COMPLETE METABOLIC PANEL WITH GFR     Status: None   Collection Time: 02/24/17  1:39 PM  Result Value Ref Range   Glucose, Bld 82 65 - 99 mg/dL    Comment: .            Fasting reference interval .    BUN 9 7 - 25 mg/dL   Creat 1.61 0.96 - 0.45 mg/dL   GFR, Est Non African American 109 > OR = 60 mL/min/1.33m2   GFR, Est African American 127 > OR = 60 mL/min/1.41m2   BUN/Creatinine Ratio NOT APPLICABLE 6 - 22 (calc)   Sodium 138 135 - 146 mmol/L   Potassium 4.7 3.5 - 5.3 mmol/L   Chloride 102 98 - 110 mmol/L   CO2 30 20 - 32 mmol/L   Calcium 9.2 8.6 - 10.2 mg/dL   Total Protein 6.7 6.1 - 8.1 g/dL   Albumin 4.3 3.6 - 5.1 g/dL   Globulin 2.4 1.9 - 3.7 g/dL (calc)   AG Ratio 1.8 1.0 - 2.5 (calc)   Total Bilirubin 0.3 0.2 - 1.2 mg/dL   Alkaline phosphatase (APISO) 62 33 - 115 U/L   AST 14 10 - 30 U/L   ALT 10 6 - 29 U/L  CBC w/Diff/Platelet     Status: Abnormal   Collection Time: 02/24/17  1:39 PM  Result Value Ref Range   WBC 6.6 3.8 - 10.8 Thousand/uL   RBC 4.62 3.80 - 5.10 Million/uL   Hemoglobin 13.7 11.7 - 15.5 g/dL   HCT 40.9 81.1 - 91.4 %   MCV 89.8 80.0 - 100.0 fL   MCH 29.7 27.0 - 33.0 pg   MCHC 33.0 32.0 - 36.0 g/dL   RDW 78.2 95.6 - 21.3 %   Platelets 222 140 - 400 Thousand/uL   MPV 10.4 7.5 - 12.5 fL   Neutro Abs 4,039 1,500 - 7,800 cells/uL   Lymphs Abs 1,729 850 - 3,900 cells/uL   WBC mixed population 297 200 - 950 cells/uL   Eosinophils Absolute 502 (H) 15 - 500 cells/uL   Basophils Absolute 33 0 - 200 cells/uL   Neutrophils Relative % 61.2 %   Total Lymphocyte 26.2 %   Monocytes Relative 4.5 %   Eosinophils Relative 7.6 %   Basophils Relative 0.5 %  Lipase     Status: None   Collection Time: 02/24/17  1:39 PM  Result Value Ref Range    Lipase 17 7 - 60 U/L  H. pylori breath test     Status: None   Collection Time: 02/24/17  1:39 PM  Result Value Ref Range   H. pylori Breath Test NOT DETECTED NOT DETECT    Comment: . Antimicrobials, proton pump inhibitors, and bismuth preparations are known to suppress H. pylori, and  ingestion of these prior to H. pylori diagnostic testing may lead to false negative results. If clinically  indicated,  the test may be repeated on a new specimen obtained two weeks after discontinuing treatment. However, a positive result is still clinically valid.      Assessment & Plan  1. Cough - PR EVAL OF BRONCHOSPASM - azithromycin (ZITHROMAX) 250 MG tablet; Day1: 2Tabs, Day2-5: 1Tab daily  Dispense: 6 tablet; Refill: 0 - fluticasone furoate-vilanterol (BREO ELLIPTA) 200-25 MCG/INH AEPB; Inhale 1 puff into the lungs daily.  Dispense: 1 each; Refill: 1  2. Mild intermittent asthma, unspecified whether complicated - PR EVAL OF BRONCHOSPASM - fluticasone furoate-vilanterol (BREO ELLIPTA) 200-25 MCG/INH AEPB; Inhale 1 puff into the lungs daily.  Dispense: 1 each; Refill: 1  3. Gastroesophageal reflux disease without esophagitis - Continue to avoid triggers; continue ranitidine.  4. Bronchospasm - PR EVAL OF BRONCHOSPASM - azithromycin (ZITHROMAX) 250 MG tablet; Day1: 2Tabs, Day2-5: 1Tab daily  Dispense: 6 tablet; Refill: 0  - Due to slight diminishment in the bilateral bases and ongoing symptoms with paroxysmal coughing and bronchospasm we will treat with Azithromycin in case of infectious process.  Switch to Breo to provide LABA support in addition to inhaled steroid.  Continue Albuterol PRN for shortness of breath or chest tightness. Spirometry shows excellent lung function at this time, however, pt is not symptomatic in office today.  If coughing is not improving after 1-2 weeks, we will consider referral to ENT.  -Red flags and when to present for emergency care or RTC including fever  >101.19F, chest pain, shortness of breath, new/worsening/un-resolving symptoms, reviewed with patient at time of visit. Follow up and care instructions discussed and provided in AVS.

## 2017-03-09 NOTE — Patient Instructions (Addendum)
[employer] technologistMist Vaporizer - Use at Night A cool mist vaporizer is a device that releases a cool mist into the air. If you have a cough or a cold, using a vaporizer may help relieve your symptoms. The mist adds moisture to the air, which may help thin your mucus and make it less sticky. When your mucus is thin and less sticky, it easier for you to breathe and to cough up secretions. Do not use a vaporizer if you are allergic to mold. Follow these instructions at home:  Follow the instructions that come with the vaporizer.  Do not use anything other than distilled water in the vaporizer.  Do not run the vaporizer all of the time. Doing that can cause mold or bacteria to grow in the vaporizer.  Clean the vaporizer after each time that you use it.  Clean and dry the vaporizer well before storing it.  Stop using the vaporizer if your breathing symptoms get worse. This information is not intended to replace advice given to you by your health care provider. Make sure you discuss any questions you have with your health care provider. Document Released: 11/29/2003 Document Revised: 09/21/2015 Document Reviewed: 06/02/2015 Elsevier Interactive Patient Education  2018 [employer]   Bronchospasm, Adult Bronchospasm is a tightening of the airways going into the lungs. During an episode, it may be harder to breathe. You may cough, and you may make a whistling sound when you breathe (wheeze). This condition often affects people with asthma. What are the causes? This condition is caused by swelling and irritation in the airways. It can be triggered by:  An infection (common).  Seasonal allergies.  An allergic reaction.  Exercise.  Irritants. These include pollution, cigarette smoke, strong odors, aerosol sprays, and paint fumes.  Weather changes. Winds increase molds and pollens in the air. Cold air may cause swelling.  Stress and emotional upset.  What are the signs or symptoms? Symptoms of  this condition include:  Wheezing. If the episode was triggered by an allergy, wheezing may start right away or hours later.  Nighttime coughing.  Frequent or severe coughing with a simple cold.  Chest tightness.  Shortness of breath.  Decreased ability to exercise.  How is this diagnosed? This condition is usually diagnosed with a review of your medical history and a physical exam. Tests, such as lung function tests, are sometimes done to look for other conditions. The need for a chest X-ray depends on where the wheezing occurs and whether it is the first time you have wheezed. How is this treated? This condition may be treated with:  Inhaled medicines. These open up the airways and help you breathe. They can be taken with an inhaler or a nebulizer device.  Corticosteroid medicines. These may be given for severe bronchospasm, usually when it is associated with asthma.  Avoiding triggers, such as irritants, infection, or allergies.  Follow these instructions at home: Medicines  Take over-the-counter and prescription medicines only as told by your health care provider.  If you need to use an inhaler or nebulizer to take your medicine, ask your health care provider to explain how to use it correctly. If you were given a spacer, always use it with your inhaler. Lifestyle  Reduce the number of triggers in your home. To do this: ? Change your heating and air conditioning filter at least once a month. ? Limit your use of fireplaces and wood stoves. ? Do not smoke. Do not allow smoking in your  home. ? Avoid using perfumes and fragrances. ? Get rid of pests, such as roaches and mice, and their droppings. ? Remove any mold from your home. ? Keep your house clean and dust free. Use unscented cleaning products. ? Replace carpet with wood, tile, or vinyl flooring. Carpet can trap dander and dust. ? Use allergy-proof pillows, mattress covers, and box spring covers. ? Wash bed sheets and  blankets every week in hot water. Dry them in a dryer. ? Use blankets that are made of polyester or cotton. ? Wash your hands often. ? Do not allow pets in your bedroom.  Avoid breathing in cold air when you exercise. General instructions  Have a plan for seeking medical care. Know when to call your health care provider and local emergency services, and where to get emergency care.  Stay up to date on your immunizations.  When you have an episode of bronchospasm, stay calm. Try to relax and breathe more slowly.  If you have asthma, make sure you have an asthma action plan.  Keep all follow-up visits as told by your health care provider. This is important. Contact a health care provider if:  You have muscle aches.  You have chest pain.  The mucus that you cough up (sputum) changes from clear or white to yellow, green, gray, or bloody.  You have a fever.  Your sputum gets thicker. Get help right away if:  Your wheezing and coughing get worse, even after you take your prescribed medicines.  It gets even harder to breathe.  You develop severe chest pain. Summary  Bronchospasm is a tightening of the airways going into the lungs.  During an episode of bronchospasm, you may have a harder time breathing. You may cough and make a whistling sound when you breathe (wheeze).  Avoid exposure to triggers such as smoke, dust, mold, animal dander, and fragrances.  When you have an episode of bronchospasm, stay calm. Try to relax and breathe more slowly. This information is not intended to replace advice given to you by your health care provider. Make sure you discuss any questions you have with your health care provider. Document Released: 03/06/2003 Document Revised: 02/28/2016 Document Reviewed: 02/28/2016 Elsevier Interactive Patient Education  2017 [employer] Inc.  Cough, Adult A cough helps to clear your throat and lungs. A cough may last only 2-3 weeks (acute), or it may last  longer than 8 weeks (chronic). Many different things can cause a cough. A cough may be a sign of an illness or another medical condition. Follow these instructions at home:  Pay attention to any changes in your cough.  Take medicines only as told by your doctor. ? If you were prescribed an antibiotic medicine, take it as told by your doctor. Do not stop taking it even if you start to feel better. ? Talk with your doctor before you try using a cough medicine.  Drink enough fluid to keep your pee (urine) clear or pale yellow.  If the air is dry, use a cold steam vaporizer or humidifier in your home.  Stay away from things that make you cough at work or at home.  If your cough is worse at night, try using extra pillows to raise your head up higher while you sleep.  Do not smoke, and try not to be around smoke. If you need help quitting, ask your doctor.  Do not have caffeine.  Do not drink alcohol.  Rest as needed. Contact a doctor if:  You have new problems (symptoms).  You cough up yellow fluid (pus).  Your cough does not get better after 2-3 weeks, or your cough gets worse.  Medicine does not help your cough and you are not sleeping well.  You have pain that gets worse or pain that is not helped with medicine.  You have a fever.  You are losing weight and you do not know why.  You have night sweats. Get help right away if:  You cough up blood.  You have trouble breathing.  Your heartbeat is very fast. This information is not intended to replace advice given to you by your health care provider. Make sure you discuss any questions you have with your health care provider. Document Released: 11/14/2010 Document Revised: 08/09/2015 Document Reviewed: 05/10/2014 Elsevier Interactive Patient Education  [employer].

## 2017-03-12 ENCOUNTER — Ambulatory Visit: Payer: BC Managed Care – PPO | Admitting: Family Medicine

## 2017-05-20 ENCOUNTER — Encounter: Payer: Self-pay | Admitting: Family Medicine

## 2017-05-20 ENCOUNTER — Ambulatory Visit: Payer: BC Managed Care – PPO | Admitting: Family Medicine

## 2017-05-20 VITALS — BP 120/80 | HR 82 | Temp 98.3°F | Resp 16 | Ht 70.0 in | Wt 241.4 lb

## 2017-05-20 DIAGNOSIS — R109 Unspecified abdominal pain: Secondary | ICD-10-CM

## 2017-05-20 DIAGNOSIS — R1084 Generalized abdominal pain: Secondary | ICD-10-CM

## 2017-05-20 DIAGNOSIS — N3001 Acute cystitis with hematuria: Secondary | ICD-10-CM | POA: Diagnosis not present

## 2017-05-20 DIAGNOSIS — R300 Dysuria: Secondary | ICD-10-CM

## 2017-05-20 DIAGNOSIS — N898 Other specified noninflammatory disorders of vagina: Secondary | ICD-10-CM | POA: Diagnosis not present

## 2017-05-20 LAB — POCT URINALYSIS DIPSTICK
Bilirubin, UA: NEGATIVE
GLUCOSE UA: NEGATIVE
Ketones, UA: NEGATIVE
Nitrite, UA: NEGATIVE
Spec Grav, UA: 1.02 (ref 1.010–1.025)
Urobilinogen, UA: NEGATIVE E.U./dL — AB
pH, UA: 5 (ref 5.0–8.0)

## 2017-05-20 MED ORDER — CIPROFLOXACIN HCL 250 MG PO TABS: 250.0000 mg | ORAL_TABLET | Freq: Two times a day (BID) | ORAL | 0 refills | Status: AC

## 2017-05-20 MED ORDER — PHENAZOPYRIDINE HCL 100 MG PO TABS: 100.0000 mg | ORAL_TABLET | Freq: Three times a day (TID) | ORAL | 0 refills | Status: DC | PRN

## 2017-05-20 NOTE — Progress Notes (Deleted)
poct

## 2017-05-20 NOTE — Progress Notes (Signed)
Name: Anna GreeningSarah Whitehead   MRN: [id number]    DOB: 09/22/1985   Date:05/20/2017       Progress Note  Subjective  Chief Complaint  Chief Complaint  Patient presents with  . Abdominal Pain  . Vaginal Pain    yeast infection, bladder pain , discharge for 3 days    HPI  Pt endorses vaginal pain and discharge and dysuria and lower back pain ongoing x3 days. Pt endorses vaginal spotting when wiping yesterday x1. Lower abdominal cramping started 2 days ago.  IUD in place - hasn't had a period in over a year and had spotting 2-3 months ago but none since. Pt sts was working out a lot recently and thought pain was due to that but notes pain comes in waves. Pt had similar symptoms before with wave like back pain- mainly on right side - and was seen here and told possible kidney stones- but never noticed passing and did not have imaging due to symptoms abating. Patient notes excessive vaginal discharge- thick yellow-white also notes vaginal itching. Took Monistat-1 last night. Foul- fishy odor. Denies fevers, chills, nausea vomiting. Notes intermittent diarrhea this week. Patient states sexually active but not in a while, one partner- husband - declines STI testing. Last abx use was 03/09/17- azithromycin. Patient states doesn't drink a lot of water due to work and notes she commonly has yeast infection and UTI's. Of note pt was told she had ovarian cysts in the past.    Patient Active Problem List   Diagnosis Date Noted  . Depression, major, recurrent, moderate (HCC) 07/18/2016  . B12 deficiency 07/18/2016  . Vitamin D insufficiency 07/18/2016  . History of gestational hypertension 11/14/2015  . Asthma, mild 10/13/2014  . H/O high risk medication treatment 10/13/2014  . Overweight 10/13/2014  . Anxiety and depression 10/13/2014    Social History   Tobacco Use  . Smoking status: Never Smoker  . Smokeless tobacco: Never Used  Substance Use Topics  . Alcohol use: Yes    Alcohol/week: 0.0 oz   Comment: ocassionally     Current Outpatient Medications:  .  albuterol (VENTOLIN HFA) 108 (90 Base) MCG/ACT inhaler, Inhale 2 puffs into the lungs every 4 (four) hours as needed for wheezing or shortness of breath., Disp: 1 Inhaler, Rfl: 0 .  azithromycin (ZITHROMAX) 250 MG tablet, Day1: 2Tabs, Day2-5: 1Tab daily, Disp: 6 tablet, Rfl: 0 .  dicyclomine (BENTYL) 20 MG tablet, Take 1 tablet (20 mg total) by mouth every 6 (six) hours as needed for spasms., Disp: 30 tablet, Rfl: 0 .  fluticasone furoate-vilanterol (BREO ELLIPTA) 200-25 MCG/INH AEPB, Inhale 1 puff into the lungs daily., Disp: 1 each, Rfl: 1 .  OVER THE COUNTER MEDICATION, Take 1 tablet by mouth 2 (two) times daily. Patient takes over the counter Zantac twice a day for heartburn, Disp: , Rfl:  .  ranitidine (ZANTAC) 150 MG tablet, Take 1 tablet (150 mg total) by mouth 2 (two) times daily., Disp: 90 tablet, Rfl: 0  Allergies  Allergen Reactions  . Duloxetine Nausea Only    Dizziness     ROS  Constitutional: Negative for fever or weight change.  Respiratory: Negative for cough and shortness of breath.   Cardiovascular: Negative for chest pain or palpitations.  Gastrointestinal: See HPI GU: See HPI Musculoskeletal: Positive lower back pain. Negative for gait problem or joint swelling.  Skin: Negative for rash.  Neurological: Negative for dizziness or headache.  No other specific complaints in a complete review of  systems (except as listed in HPI above).  Objective  Vitals:   05/20/17 0951  BP: 120/80  Pulse: 82  Resp: 16  Temp: 98.3 F (36.8 C)  TempSrc: Oral  SpO2: 98%  Weight: 241 lb 6.4 oz (109.5 kg)  Height: 5\' 10"  (1.778 m)    Body mass index is 34.64 kg/m.  Nursing Note and Vital Signs reviewed.  Physical Exam   Constitutional: Patient appears well-developed and well-nourished. Obese. No distress.  HEENT: head atraumatic, normocephalic, pupils equal and reactive to light, EOM's intact, TM's without  erythema or bulging, no maxillary or frontal sinus tenderness , neck supple without lymphadenopathy, oropharynx pink and moist without exudate Cardiovascular: Normal rate, regular rhythm, S1/S2 present.  No murmur or rub heard. No BLE edema. Pulmonary/Chest: Effort normal and breath sounds clear. No respiratory distress or retractions. Abdominal: Soft, + tenderness in bilateral lower quadrants, bowel sounds present x4 quadrants.  No CVA Tenderness. Psychiatric: Patient has a normal mood and affect. behavior is normal. Judgment and thought content normal.  Results for orders placed or performed in visit on 05/20/17 (from the past 72 hour(s))  POCT urinalysis dipstick     Status: Abnormal   Collection Time: 05/20/17  9:57 AM  Result Value Ref Range   Color, UA gold    Clarity, UA cloudy    Glucose, UA negative    Bilirubin, UA negative    Ketones, UA negative    Spec Grav, UA 1.020 1.010 - 1.025   Blood, UA large    pH, UA 5.0 5.0 - 8.0   Protein, UA trace    Urobilinogen, UA negative (A) 0.2 or 1.0 E.U./dL   Nitrite, UA negative    Leukocytes, UA Large (3+) (A) Negative   Appearance cloudy    Odor yes     Assessment & Plan  1. Acute cystitis with hematuria - ciprofloxacin (CIPRO) 250 MG tablet; Take 1 tablet (250 mg total) by mouth 2 (two) times daily for 3 days.  Dispense: 6 tablet; Refill: 0 - phenazopyridine (PYRIDIUM) 100 MG tablet; Take 1 tablet (100 mg total) by mouth 3 (three) times daily as needed for pain.  Dispense: 9 tablet; Refill: 0 - Urine Culture - Urinalysis, microscopic only  2. Vaginal discharge - Declines STI testing today; will await we prep prior to treatment - discussed possible additional treatment including flagyl and diflucan based on results. - WET PREP BY MOLECULAR PROBE  3. Flank pain - Advised if pain worsens in any way she needs to RTC or present for emergent care.  We will hold on imaging today as she is negative for CVA tenderness, however we did  discuss signs and symptoms of nephrolithiasis in detail and she verbalizes understanding. - phenazopyridine (PYRIDIUM) 100 MG tablet; Take 1 tablet (100 mg total) by mouth 3 (three) times daily as needed for pain.  Dispense: 9 tablet; Refill: 0 - WET PREP BY MOLECULAR PROBE - Urine Culture - Urinalysis, microscopic only  4. Dysuria - phenazopyridine (PYRIDIUM) 100 MG tablet; Take 1 tablet (100 mg total) by mouth 3 (three) times daily as needed for pain.  Dispense: 9 tablet; Refill: 0 - Advised pyridium for 3 days maximum, if pain not improving she must call our office to notify that pain is not improving. - WET PREP BY MOLECULAR PROBE - Urine Culture - Urinalysis, microscopic only  5. Generalized abdominal pain - POCT urinalysis dipstick - WET PREP BY MOLECULAR PROBE  -Red flags and when to present for emergency  care or RTC including fever >101.75F, chest pain, shortness of breath, new/worsening/un-resolving symptoms, severe abdominal/back/flank pain, increase in bleeding, vomiting, reviewed with patient at time of visit. Follow up and care instructions discussed and provided in AVS. - Return if symptoms worsen or fail to improve, for 1-2 days.

## 2017-05-20 NOTE — Patient Instructions (Addendum)

## 2017-05-21 ENCOUNTER — Other Ambulatory Visit: Payer: Self-pay | Admitting: Family Medicine

## 2017-05-21 DIAGNOSIS — B373 Candidiasis of vulva and vagina: Secondary | ICD-10-CM

## 2017-05-21 DIAGNOSIS — B3731 Acute candidiasis of vulva and vagina: Secondary | ICD-10-CM

## 2017-05-21 LAB — WET PREP BY MOLECULAR PROBE
Candida species: DETECTED — AB
Gardnerella vaginalis: NOT DETECTED
MICRO NUMBER: 90292299
SPECIMEN QUALITY: ADEQUATE
Trichomonas vaginosis: NOT DETECTED

## 2017-05-21 LAB — URINALYSIS, MICROSCOPIC ONLY
BACTERIA UA: NONE SEEN /HPF
Hyaline Cast: NONE SEEN /LPF
RBC / HPF: NONE SEEN /HPF (ref 0–2)

## 2017-05-21 LAB — URINE CULTURE
MICRO NUMBER:: 90289705
SPECIMEN QUALITY: ADEQUATE

## 2017-05-21 MED ORDER — FLUCONAZOLE 150 MG PO TABS: 150.0000 mg | ORAL_TABLET | Freq: Once | ORAL | 1 refills | Status: AC

## 2017-05-26 ENCOUNTER — Telehealth: Payer: Self-pay

## 2017-05-26 NOTE — Telephone Encounter (Signed)
Review Urine Culture and Wet Prep results with patient. Patient states she has not picked up the Diflucan yet but will pick it up and see if her symptoms improve. If they do not she will call to reschedule another appointment for further evaluation.

## 2018-05-26 ENCOUNTER — Encounter: Payer: Self-pay | Admitting: Nurse Practitioner

## 2018-05-26 ENCOUNTER — Other Ambulatory Visit: Payer: Self-pay

## 2018-05-26 ENCOUNTER — Ambulatory Visit: Payer: BC Managed Care – PPO | Admitting: Nurse Practitioner

## 2018-05-26 VITALS — BP 114/82 | HR 94 | Temp 98.2°F | Resp 16 | Ht 70.0 in | Wt 234.4 lb

## 2018-05-26 DIAGNOSIS — K219 Gastro-esophageal reflux disease without esophagitis: Secondary | ICD-10-CM

## 2018-05-26 DIAGNOSIS — M722 Plantar fascial fibromatosis: Secondary | ICD-10-CM | POA: Diagnosis not present

## 2018-05-26 DIAGNOSIS — F411 Generalized anxiety disorder: Secondary | ICD-10-CM | POA: Diagnosis not present

## 2018-05-26 DIAGNOSIS — M5441 Lumbago with sciatica, right side: Secondary | ICD-10-CM | POA: Diagnosis not present

## 2018-05-26 DIAGNOSIS — G8929 Other chronic pain: Secondary | ICD-10-CM

## 2018-05-26 MED ORDER — MELOXICAM 15 MG PO TABS
15.0000 mg | ORAL_TABLET | Freq: Every day | ORAL | 1 refills | Status: DC
Start: 2018-05-26 — End: 2018-07-09

## 2018-05-26 MED ORDER — FAMOTIDINE 20 MG PO TABS
20.0000 mg | ORAL_TABLET | Freq: Every day | ORAL | 1 refills | Status: DC
Start: 2018-05-26 — End: 2019-02-16

## 2018-05-26 MED ORDER — BUSPIRONE HCL 7.5 MG PO TABS: 7.5000 mg | ORAL_TABLET | Freq: Two times a day (BID) | ORAL | 1 refills | Status: DC

## 2018-05-26 NOTE — Progress Notes (Signed)
Name: Anna Whitehead   MRN: [id number]    DOB: 1985-07-14   Date:05/26/2018       Progress Note  Subjective  Chief Complaint  Chief Complaint  Patient presents with  . Leg Pain    started about a month or so ago with right leg pain  . Foot Problem    bilateral     HPI Bilateral Foot pain Patient endorses bilateral foot pain 3 months ago, worst first thing in the morning and at night. started wearing better shoes, states worse when she wears flats. Improved after wearing better  foot wear. Does not remember exactly where on the bottom of her foot it hurt as it is not hurting now. Denies known injury.  Lower back Pain Patient has chronic lower back pain- over 10 years. She sees chiropractor routinely again starting 2 months ago she is out of alignment 1.5 inches. Denies bowel/bladder incontience, paresthesia.  Sciatic Pain  Right leg pain states feels stinging pain that starts at buttocks and shoots down to right knee initially and now all the way down to foot now. Pain is worse at night. Patient has tried ibuprofen and acetaminophen PRN with relief of symptoms temporarily; states pain is outer side of leg   Wt Readings from Last 3 Encounters:  05/26/18 234 lb 6.4 oz (106.3 kg)  05/20/17 241 lb 6.4 oz (109.5 kg)  03/09/17 231 lb (104.8 kg)    GERD Was taking ranitidine nightly but it was recalled. States has acid reflux and coughing at night when she lays down if she eats dinner late. Not currently taking anything and symptoms returned. Denies chest pain, dysphagia.   Anxiety  Patient endorses increase anxiety- states has had some panic attacks in the last few months, where she has to walk away and deep breath. States had a bad one a month ago and feels like that's when her back pain got bad because she had gotten so tense. Has an episode of increased anxiety maybe once a week- resolves when she gets away from situation. She does note overall increase anxiety- with increased work load  and school feels like she could use something daily, but in the past she would try to get off of it when she felt better and would feel very poorly. Would like a daily medicine that she could stop abuprtly if she forgets to take it.  Depression screen [facility] 2/9 05/26/2018 02/24/2017 09/05/2016 06/05/2016 10/13/2014  Decreased Interest 0 0 1 2 1   Down, Depressed, Hopeless 0 0 1 2 1   PHQ - 2 Score 0 0 2 4 2   Altered sleeping - - 0 2 1  Tired, decreased energy - - 0 3 1  Change in appetite - - 0 2 1  Feeling bad or failure about yourself  - - 0 3 2  Trouble concentrating - - 0 2 1  Moving slowly or fidgety/restless - - 0 0 0  Suicidal thoughts - - 0 0 0  PHQ-9 Score - - 2 16 8   Difficult doing work/chores - - Somewhat difficult Somewhat difficult Somewhat difficult   GAD 7 : Generalized Anxiety Score 05/26/2018  Nervous, Anxious, on Edge 3  Control/stop worrying 3  Worry too much - different things 3  Trouble relaxing 3  Restless 3  Easily annoyed or irritable 3  Afraid - awful might happen 3  Total GAD 7 Score 21  Anxiety Difficulty Very difficult     Patient Active Problem List   Diagnosis  Date Noted  . Depression, major, recurrent, moderate (HCC) 07/18/2016  . B12 deficiency 07/18/2016  . Vitamin D insufficiency 07/18/2016  . History of gestational hypertension 11/14/2015  . Asthma, mild 10/13/2014  . H/O high risk medication treatment 10/13/2014  . Overweight 10/13/2014  . Anxiety and depression 10/13/2014    Past Medical History:  Diagnosis Date  . Allergic eczema   . Anxiety   . Depression   . Dysmenorrhea   . Iron deficiency anemia due to chronic blood loss   . Menorrhagia   . Midline low back pain with left-sided sciatica   . Mild asthma    seasonal, not used recently  . Overweight (BMI 25.0-29.9)   . Right foot pain   . Seasonal allergic rhinitis     Past Surgical History:  Procedure Laterality Date  . MYRINGOPLASTY    . NO PAST SURGERIES      Social  History   Tobacco Use  . Smoking status: Never Smoker  . Smokeless tobacco: Never Used  Substance Use Topics  . Alcohol use: Yes    Alcohol/week: 0.0 standard drinks    Comment: ocassionally     Current Outpatient Medications:  .  busPIRone (BUSPAR) 7.5 MG tablet, Take 1 tablet (7.5 mg total) by mouth 2 (two) times daily., Disp: 60 tablet, Rfl: 1 .  famotidine (PEPCID) 20 MG tablet, Take 1 tablet (20 mg total) by mouth at bedtime., Disp: 90 tablet, Rfl: 1 .  meloxicam (MOBIC) 15 MG tablet, Take 1 tablet (15 mg total) by mouth daily., Disp: 30 tablet, Rfl: 1 .  ranitidine (ZANTAC) 150 MG tablet, Take 1 tablet (150 mg total) by mouth 2 (two) times daily. (Patient not taking: Reported on 05/26/2018), Disp: 90 tablet, Rfl: 0  Allergies  Allergen Reactions  . Duloxetine Nausea Only    Dizziness     ROS   No other specific complaints in a complete review of systems (except as listed in HPI above).  Objective  Vitals:   05/26/18 1539  BP: 114/82  Pulse: 94  Resp: 16  Temp: 98.2 F (36.8 C)  SpO2: 95%  Weight: 234 lb 6.4 oz (106.3 kg)  Height:  (1.778 m)    Body mass index is 33.63 kg/m.  Nursing Note and Vital Signs reviewed.  Physical Exam Vitals signs reviewed.  Constitutional:      Appearance: She is well-developed.  HENT:     Head: Normocephalic and atraumatic.  Neck:     Musculoskeletal: Normal range of motion and neck supple.     Vascular: No carotid bruit.  Cardiovascular:     Heart sounds: Normal heart sounds.  Pulmonary:     Effort: Pulmonary effort is normal.     Breath sounds: Normal breath sounds.  Abdominal:     General: Bowel sounds are normal.     Palpations: Abdomen is soft.     Tenderness: There is no abdominal tenderness.  Musculoskeletal: Normal range of motion.        General: No tenderness.     Comments: positive right straight leg test   Skin:    General: Skin is warm and dry.     Capillary Refill: Capillary refill takes less  than 2 seconds.  Neurological:     Mental Status: She is alert and oriented to person, place, and time.     GCS: GCS eye subscore is 4. GCS verbal subscore is 5. GCS motor subscore is 6.     Sensory: No sensory  deficit.  Psychiatric:        Speech: Speech normal.        Behavior: Behavior normal.        Thought Content: Thought content normal.        Judgment: Judgment normal.       No results found for this or any previous visit (from the past 48 hour(s)).  Assessment & Plan  1. Plantar fasciitis, bilateral Stretches, good foot support, ice, weight loss  - meloxicam (MOBIC) 15 MG tablet; Take 1 tablet (15 mg total) by mouth daily.  Dispense: 30 tablet; Refill: 1  2. Chronic bilateral low back pain with right-sided sciatica Good body mechanics, chiropractor, weight loss, stretching  - meloxicam (MOBIC) 15 MG tablet; Take 1 tablet (15 mg total) by mouth daily.  Dispense: 30 tablet; Refill: 1  3. Gastroesophageal reflux disease without esophagitis Bed wedge, avoid eating late, diet - famotidine (PEPCID) 20 MG tablet; Take 1 tablet (20 mg total) by mouth at bedtime.  Dispense: 90 tablet; Refill: 1  4. GAD (generalized anxiety disorder) Discussed management of acute anxiety- would like to hold hydroxyzine and take buspar - busPIRone (BUSPAR) 7.5 MG tablet; Take 1 tablet (7.5 mg total) by mouth 2 (two) times daily.  Dispense: 60 tablet; Refill: 1  Follow-up in 6 weeks to ensure anxiety has improved and for physical

## 2018-05-26 NOTE — Patient Instructions (Addendum)
-  For sciatic pain and plantar faciitis- look of physical therapy youtube videos by Nadine Counts & Brad" for exercises you can do to help stretch and prevent worsening pain and reoccurrence. Make sure you wear shoes with good support and stretch daily. Use a frozen water bottle to roll under your feet to help with pain. Take meloxicam once daily with food, Drink plenty of water with this medication. Do not take additional ibuprofen, naproxen or other NSAIDs with this, can take acetaminophen (max 3,000mg / 24 hours) for pain control. If pain is unimproved please let us know so we can re-assess and consider steroid taper. People who take nonsteroidal anti-inflammatory drugs (NSAIDs) (other than aspirin) such as Ibuprofen (Advil, Motrin), Naproxen (Aleve), celecoxib, diclofenac ect. may have a higher risk of having a heart attack or a stroke than people who do not take these medications.  If you experience any of the following symptoms, stop taking your NSAID and call us: severe stomach pain, heartburn, vomiting a substance that is bloody or looks like coffee grounds, blood in the stool, or black and tarry stools.  - For acid-reflex can take famotidine 20mg  1 hour before bedtime. Avoid heavy meals 2 hours before bedtime.   - For anxiety take buspar twice daily, we will re-assess how this is working for you in 6 weeks. If you have any concerns before then do not hesitate to ask.    Spondylolisthesis Rehab Ask your health care provider which exercises are safe for you. Do exercises exactly as told by your health care provider and adjust them as directed. It is normal to feel mild stretching, pulling, tightness, or discomfort as you do these exercises, but you should stop right away if you feel sudden pain or your pain gets worse. Do not begin these exercises until told by your health care provider. Stretching and range of motion exercises These exercises warm up your muscles and joints and improve the movement and  flexibility of your hips and your back. These exercises may also help to relieve pain, numbness, and tingling. Exercise A: Single knee to chest  1. Lie on your back on a firm surface with both legs straight. 2. Bend one of your knees. Use your hands to move your knee up toward your chest until you feel a gentle stretch in your lower back and buttock. ? Hold your leg in this position by holding onto the front of your knee. ? Keep your other leg as straight as possible. 3. Hold for __________ seconds. 4. Slowly return to the starting position. 5. Repeat this exercise with your other leg. Repeat __________ times. Complete this exercise __________ times a day. Exercise B: Double knee to chest  1. Lie on your back on a firm surface with both legs straight. 2. Bend one of your knees and move it toward your chest until you feel a gentle stretch in your lower back and buttock. 3. Tense your abdominal muscles and repeat the previous step with your other leg. 4. Hold both of your legs in this position by holding onto the backs of your thighs or the fronts of your knees. 5. Hold for __________ seconds. 6. Tense your abdominal muscles and slowly move your legs back to the floor, one leg at a time. Repeat __________ times. Complete this exercise __________ times a day. Strengthening exercises These exercises build strength and endurance in your back. Endurance is the ability to use your muscles for a long time, even after they get tired. Exercise  C: Pelvic tilt 1. Lie on your back on a firm bed or the floor. Bend your knees and keep your feet flat. 2. Tense your abdominal muscles. Tip your pelvis up toward the ceiling and flatten your lower back into the floor. ? To help with this exercise, you may place a small towel under your lower back and try to push your back into the towel. 3. Hold for __________ seconds. 4. Let your muscles relax completely before you repeat this exercise. Repeat __________  times. Complete this exercise __________ times a day. Exercise D: Abdominal crunch  1. Lie on your back on a firm surface. Bend your knees and keep your feet flat. Cross your arms over your chest. 2. Tuck your chin down toward your chest, without bending your neck. 3. Use your abdominal muscles to lift your upper body off of the ground, straight up into the air. ? Try to lift yourself until your shoulder blades are off the ground. You may need to work up to this. ? Keep your lower back on the ground while you crunch upward. ? Do not hold your breath. 4. Slowly lower yourself down. Keep your abdominal muscles tense until you are back to the starting position. Repeat __________ times. Complete this exercise __________ times a day. Exercise E: Alternating arm and leg raises  1. Get on your hands and knees on a firm surface. If you are on a hard floor, you may want to use padding to cushion your knees, such as an exercise mat. 2. Line up your arms and legs. Your hands should be below your shoulders, and your knees should be below your hips. 3. Lift your left leg behind you. At the same time, raise your right arm and straighten it in front of you. ? Do not lift your leg higher than your hip. ? Do not lift your arm higher than your shoulder. ? Keep your abdominal and back muscles tight. ? Keep your hips facing the ground. ? Do not arch your back. ? Keep your balance carefully, and do not hold your breath. 4. Hold for __________ seconds. 5. Slowly return to the starting position and repeat with your right leg and your left arm. Repeat __________ times. Complete this exercise __________ times a day. Posture and body mechanics  Body mechanics refers to the movements and positions of your body while you do your daily activities. Posture is part of body mechanics. Good posture and healthy body mechanics can help to relieve stress in your body's tissues and joints. Good posture means that your spine  is in its natural S-curve position (your spine is neutral), your shoulders are pulled back slightly, and your head is not tipped forward. The following are general guidelines for applying improved posture and body mechanics to your everyday activities. Standing   When standing, keep your spine neutral and your feet about hip-width apart. Keep a slight bend in your knees. Your ears, shoulders, and hips should line up.  When you do a task in which you stand in one place for a long time, place one foot up on a stable object that is 2-4 inches (5-10 cm) high, such as a footstool. This helps keep your spine neutral. Sitting   When sitting, keep your spine neutral and keep your feet flat on the floor. Use a footrest, if necessary, and keep your thighs parallel to the floor. Avoid rounding your shoulders, and avoid tilting your head forward.  When working at a desk or  a computer, keep your desk at a height where your hands are slightly lower than your elbows. Slide your chair under your desk so you are close enough to maintain good posture.  When working at a computer, place your monitor at a height where you are looking straight ahead and you do not have to tilt your head forward or downward to look at the screen. Resting When lying down and resting, avoid positions that are most painful for you.  If you have pain with activities such as sitting, bending, stooping, or squatting (flexion-based activities), lie in a position in which your body does not bend very much. For example, avoid curling up on your side with your arms and knees near your chest (fetal position).  If you have pain with activities such as standing for a long time or reaching with your arms (extension-based activities), lie with your spine in a neutral position and bend your knees slightly. Try the following positions: ? Lying on your side with a pillow between your knees. ? Lying on your back with a pillow under your knees.   Lifting   When lifting objects, keep your feet at least shoulder-width apart and tighten your abdominal muscles.  Bend your knees and hips and keep your spine neutral. It is important to lift using the strength of your legs, not your back. Do not lock your knees straight out.  Always ask for help to lift heavy or awkward objects. This information is not intended to replace advice given to you by your health care provider. Make sure you discuss any questions you have with your health care provider. Document Released: 03/03/2005 Document Revised: 11/08/2015 Document Reviewed: 12/12/2014 Elsevier Interactive Patient Education  2019 [employer].

## 2018-05-31 ENCOUNTER — Encounter: Payer: Self-pay | Admitting: Family Medicine

## 2018-06-01 ENCOUNTER — Other Ambulatory Visit: Payer: Self-pay | Admitting: Nurse Practitioner

## 2018-06-01 DIAGNOSIS — M5430 Sciatica, unspecified side: Secondary | ICD-10-CM

## 2018-06-01 MED ORDER — GABAPENTIN 100 MG PO CAPS: 100.0000 mg | ORAL_CAPSULE | Freq: Every day | ORAL | 0 refills | Status: DC

## 2018-06-25 ENCOUNTER — Encounter: Payer: BC Managed Care – PPO | Admitting: Family Medicine

## 2018-07-08 ENCOUNTER — Encounter: Payer: Self-pay | Admitting: Family Medicine

## 2018-07-09 ENCOUNTER — Other Ambulatory Visit: Payer: Self-pay | Admitting: Nurse Practitioner

## 2018-07-09 DIAGNOSIS — M722 Plantar fascial fibromatosis: Secondary | ICD-10-CM

## 2018-07-09 DIAGNOSIS — G8929 Other chronic pain: Secondary | ICD-10-CM

## 2018-07-09 DIAGNOSIS — M5430 Sciatica, unspecified side: Secondary | ICD-10-CM

## 2018-07-09 DIAGNOSIS — M5441 Lumbago with sciatica, right side: Secondary | ICD-10-CM

## 2018-07-09 MED ORDER — MELOXICAM 15 MG PO TABS: 15.0000 mg | ORAL_TABLET | Freq: Every day | ORAL | 0 refills | Status: DC

## 2018-07-09 MED ORDER — GABAPENTIN 100 MG PO CAPS: 100.0000 mg | ORAL_CAPSULE | Freq: Every day | ORAL | 0 refills | Status: DC

## 2018-07-09 NOTE — Progress Notes (Signed)
Please call patient to set up follow-up on pain and anxiety within the next week.

## 2018-07-09 NOTE — Progress Notes (Signed)
lvm asking pt to return call to schedule appt next week with Dr Carlynn Purl. It can be a [facility] appt

## 2019-02-16 ENCOUNTER — Encounter: Payer: Self-pay | Admitting: Family Medicine

## 2019-02-16 ENCOUNTER — Ambulatory Visit: Payer: BC Managed Care – PPO | Admitting: Family Medicine

## 2019-02-16 ENCOUNTER — Other Ambulatory Visit: Payer: Self-pay

## 2019-02-16 VITALS — BP 122/80 | HR 80 | Temp 98.3°F | Resp 14 | Wt 243.4 lb

## 2019-02-16 DIAGNOSIS — Z23 Encounter for immunization: Secondary | ICD-10-CM | POA: Diagnosis not present

## 2019-02-16 DIAGNOSIS — M25561 Pain in right knee: Secondary | ICD-10-CM

## 2019-02-16 MED ORDER — MELOXICAM 15 MG PO TABS: 15.0000 mg | ORAL_TABLET | Freq: Every day | ORAL | 0 refills | Status: DC

## 2019-02-16 NOTE — Progress Notes (Signed)
Patient ID: Anna Whitehead, female    DOB: 07/09/1985, [age] y.o.   MRN: [id number]  PCP: Alba CorySowles, Krichna, MD  Chief Complaint  Patient presents with  . Knee Pain    right playing with son, felt pop and swelling    Subjective:   Anna Whitehead is a 33 y.o. female, presents to clinic with CC of the following:  Knee Pain  Incident onset: 2 days. The incident occurred at home (crawling on her carpeted floors at home playing with her child and felt a sharp pain). There was no injury mechanism. The pain is present in the right knee. The quality of the pain is described as stabbing, shooting and aching. The pain is at a severity of 7/10. The pain has been constant since onset. Associated symptoms include an inability to bear weight and a loss of motion. Pertinent negatives include no muscle weakness, numbness or tingling. She reports no foreign bodies present. The symptoms are aggravated by weight bearing and movement. She has tried ice, rest and NSAIDs for the symptoms. The treatment provided no relief.      Patient Active Problem List   Diagnosis Date Noted  . Depression, major, recurrent, moderate (HCC) 07/18/2016  . B12 deficiency 07/18/2016  . Vitamin D insufficiency 07/18/2016  . History of gestational hypertension 11/14/2015  . Asthma, mild 10/13/2014  . H/O high risk medication treatment 10/13/2014  . Overweight 10/13/2014  . Anxiety and depression 10/13/2014     No current outpatient medications on file.   Allergies  Allergen Reactions  . Duloxetine Nausea Only    Dizziness      Family History  Problem Relation Age of Onset  . Bipolar disorder Mother   . Cancer Maternal Aunt        3 of her aunts had breast cancer: 2 passed away     Social History   Socioeconomic History  . Marital status: Married    Spouse name: Para MarchDuncan  . Number of children: 1  . Years of education: Not on file  . Highest education level: Bachelor's degree (e.g., BA, AB, BS)   Occupational History  . Occupation: Runner, broadcasting/film/videoteacher  Social Needs  . Financial resource strain: Not on file  . Food insecurity    Worry: Never true    Inability: Never true  . Transportation needs    Medical: No    Non-medical: No  Tobacco Use  . Smoking status: Never Smoker  . Smokeless tobacco: Never Used  Substance and Sexual Activity  . Alcohol use: Yes    Alcohol/week: 0.0 standard drinks    Comment: ocassionally  . Drug use: No  . Sexual activity: Yes    Partners: Male  Lifestyle  . Physical activity    Days per week: 5 days    Minutes per session: 30 min  . Stress: Not at all  Relationships  . Social connections    Talks on phone: More than three times a week    Gets together: Three times a week    Attends religious service: More than 4 times per year    Active member of club or organization: Yes    Attends meetings of clubs or organizations: Never    Relationship status: Married  . Intimate partner violence    Fear of current or ex partner: No    Emotionally abused: No    Physically abused: No    Forced sexual activity: No  Other Topics Concern  . Not on file  Social History Narrative   Married, first son was born 11/13/2016      Patient is getting her Master's this summer    I personally reviewed active problem list, medication list, allergies, notes from last encounter, lab results with the patient/caregiver today.  Review of Systems  Constitutional: Negative.   HENT: Negative.   Eyes: Negative.   Respiratory: Negative.   Cardiovascular: Negative.   Gastrointestinal: Negative.   Endocrine: Negative.   Genitourinary: Negative.   Musculoskeletal: Negative.   Skin: Negative.   Allergic/Immunologic: Negative.   Neurological: Negative.  Negative for tingling and numbness.  Hematological: Negative.   Psychiatric/Behavioral: Negative.   All other systems reviewed and are negative.      Objective:   Vitals:   02/16/19 1358  BP: 122/80  Pulse: 80   Resp: 14  Temp: 98.3 F (36.8 C)  SpO2: 98%  Weight: 243 lb 6.4 oz (110.4 kg)    Body mass index is 34.92 kg/m.  Physical Exam Vitals signs and nursing note reviewed.  Constitutional:      General: She is not in acute distress.    Appearance: Normal appearance. She is well-developed. She is obese. She is not ill-appearing, toxic-appearing or diaphoretic.  HENT:     Head: Normocephalic and atraumatic.     Nose: Nose normal.  Eyes:     General:        Right eye: No discharge.        Left eye: No discharge.     Conjunctiva/sclera: Conjunctivae normal.  Neck:     Trachea: No tracheal deviation.  Cardiovascular:     Rate and Rhythm: Normal rate and regular rhythm.  Pulmonary:     Effort: Pulmonary effort is normal. No respiratory distress.     Breath sounds: No stridor.  Musculoskeletal: Normal range of motion.     Right knee: She exhibits swelling (mild, difficult exam with body habitus) and abnormal meniscus. She exhibits normal range of motion, no effusion, no ecchymosis, no deformity, no erythema, normal alignment, no LCL laxity, normal patellar mobility, no bony tenderness and no MCL laxity. Tenderness found. MCL and LCL tenderness noted. No medial joint line, no lateral joint line and no patellar tendon tenderness noted.     Comments: Right knee exam negative anterior drawer test, McMurray's meniscal testing only pain with both varus and valgus stress when fully extending the leg no pain when in a flexed knee position, no crepitus palpated, patient was able to step down off exam table onto her right leg without any difficulty and she had a normal gait, strength grossly normal strength and sensation to light touch ttp to popliteal fossa All tenderness on exam was very mild  Skin:    General: Skin is warm and dry.     Findings: No rash.  Neurological:     Mental Status: She is alert.     Motor: No abnormal muscle tone.     Coordination: Coordination normal.  Psychiatric:         Behavior: Behavior normal.      Results for orders placed or performed in visit on 05/20/17  WET PREP BY MOLECULAR PROBE  Result Value Ref Range   MICRO NUMBER: [id number]    SPECIMEN QUALITY: ADEQUATE    SOURCE: NOT GIVEN    STATUS: FINAL    Trichomonas vaginosis Not Detected    Gardnerella vaginalis Not Detected    Candida species Detected (A)   Urine Culture   Specimen: Urine  Result  Value Ref Range   MICRO NUMBER: [id number]    SPECIMEN QUALITY: ADEQUATE    Sample Source URINE    STATUS: FINAL    ISOLATE 1:      Single organism less than 10,000 CFU/mL isolated. These organisms, commonly found on external and internal genitalia, are considered colonizers. No further testing performed.  Urinalysis, microscopic only  Result Value Ref Range   WBC, UA 40-60 (A) 0 - 5 /HPF   RBC / HPF NONE SEEN 0 - 2 /HPF   Squamous Epithelial / LPF 6-10 (A) < OR = 5 /HPF   Bacteria, UA NONE SEEN NONE SEEN /HPF   Hyaline Cast NONE SEEN NONE SEEN /LPF   Yeast FEW (A) NONE SEEN /HPF  POCT urinalysis dipstick  Result Value Ref Range   Color, UA gold    Clarity, UA cloudy    Glucose, UA negative    Bilirubin, UA negative    Ketones, UA negative    Spec Grav, UA 1.020 1.010 - 1.025   Blood, UA large    pH, UA 5.0 5.0 - 8.0   Protein, UA trace    Urobilinogen, UA negative (A) 0.2 or 1.0 E.U./dL   Nitrite, UA negative    Leukocytes, UA Large (3+) (A) Negative   Appearance cloudy    Odor yes         Assessment & Plan:    1. Acute pain of right knee Suspect possible meniscal injury she describes a vague location of pain she cannot tell me where it hurts she has tenderness to the popliteal fossae mildly tender bilaterally to MCL LCL and to areas but tween ligaments and patellar tendon no obvious effusion but patient states it swollen which is difficult for me to appreciate.  She had normal range of motion and normal gait sensation and strength. Feel that she may gradually improve with  conservative management and encouraged her to get a knee brace to ice elevate, try Mobic and Tylenol.  I offered an x-ray just to assess the joint spaces make sure there is nothing abnormal but she says she will most likely wait I encouraged her to give me a call in a week or 2 if her symptoms are getting no better or if they are getting worse and I will get her referred to orthopedic specialist for further evaluation  - DG Knee 4 Views W/Patella Right; Future - meloxicam (MOBIC) 15 MG tablet; Take 1 tablet (15 mg total) by mouth daily.  Dispense: 30 tablet; Refill: 0  2. Need for influenza vaccination  - Flu Vaccine QUAD 6+ mos PF IM (Fluarix Quad PF)     Delsa Grana, PA-C 02/16/19 2:19 PM

## 2019-02-16 NOTE — Patient Instructions (Addendum)
RICE Therapy for Routine Care of Injuries Many injuries can be cared for with rest, ice, compression, and elevation (RICE therapy). This includes:  Resting the injured part.  Putting ice on the injury.  Putting pressure (compression) on the injury.  Raising the injured part (elevation). Using RICE therapy can help to lessen pain and swelling. Supplies needed:  Ice.  Plastic bag.  Towel.  Elastic bandage.  Pillow or pillows to raise (elevate) your injured body part. How to care for your injury with RICE therapy Rest Limit your normal activities, and try not to use the injured part of your body. You can go back to your normal activities when your doctor says it is okay to do them and you feel okay. Ask your doctor if you should do exercises to help your injury get better. Ice Put ice on the injured area. Do not put ice on your bare skin.  Put ice in a plastic bag.  Place a towel between your skin and the bag.  Leave the ice on for 20 minutes, 2-3 times a day. Use ice on as many days as told by your doctor.  Compression Compression means putting pressure on the injured area. This can be done with an elastic bandage. If an elastic bandage has been put on your injury:  Do not wrap the bandage too tight. Wrap the bandage more loosely if part of your body away from the bandage is blue, swollen, cold, painful, or loses feeling (gets numb).  Take off the bandage and put it on again. Do this every 3-4 hours or as told by your doctor.  See your doctor if the bandage seems to make your problems worse.  Elevation Elevation means keeping the injured area raised. If you can, raise the injured area above your heart or the center of your chest. Contact a doctor if:  You keep having pain and swelling.  Your symptoms get worse. Get help right away if:  You have sudden bad pain at your injury or lower than your injury.  You have redness or more swelling around your injury.  You  have tingling or numbness at your injury or lower than your injury, and it does not go away when you take off the bandage. Summary  Many injuries can be cared for using rest, ice, compression, and elevation (RICE therapy).  You can go back to your normal activities when you feel okay and your doctor says it is okay.  Put ice on the injured area as told by your doctor.  Get help if your symptoms get worse or if you keep having pain and swelling. This information is not intended to replace advice given to you by your health care provider. Make sure you discuss any questions you have with your health care provider. Document Released: 08/20/2007 Document Revised: 11/21/2016 Document Reviewed: 11/21/2016 Elsevier Patient Education  2020 [employer]   Meniscus Tear  A meniscus tear is a knee injury that happens when a piece of the meniscus is torn. The meniscus is a thick, rubbery, wedge-shaped cartilage in the knee. Two menisci are located in each knee. They sit between the upper bone (femur) and lower bone (tibia) that make up the knee joint. Each meniscus acts as a shock absorber for the knee. A torn meniscus is one of the most common types of knee injuries. This injury can range from mild to severe. Surgery may be needed to repair a severe tear. What are the causes? This condition may  be caused by any kneeling, squatting, twisting, or pivoting movement. Sports-related injuries are the most common cause. These often occur from:  Running and stopping suddenly. ? Changing direction. ? Being tackled or knocked off your feet.  Lifting or carrying heavy weights. As people get older, their menisci get thinner and weaker. In these people, tears can happen more easily, such as from climbing stairs. What increases the risk? You are more likely to develop this condition if you:  Play contact sports.  Have a job that requires kneeling or squatting.  Are female.  Are over 32 years old. What  are the signs or symptoms? Symptoms of this condition include:  Knee pain, especially at the side of the knee joint. You may feel pain when the injury occurs, or you may only hear a pop and feel pain later.  A feeling that your knee is clicking, catching, locking, or giving way.  Not being able to fully bend or extend your knee.  Bruising or swelling in your knee. How is this diagnosed? This condition may be diagnosed based on your symptoms and a physical exam. You may also have tests, such as:  X-rays.  MRI.  A procedure to look inside your knee with a narrow surgical telescope (arthroscopy). You may be referred to a knee specialist (orthopedic surgeon). How is this treated? Treatment for this injury depends on the severity of the tear. Treatment for a mild tear may include:  Rest.  Medicine to reduce pain and swelling. This is usually a nonsteroidal anti-inflammatory drug (NSAID), like ibuprofen.  A knee brace, sleeve, or wrap.  Using crutches or a walker to keep weight off your knee and to help you walk.  Exercises to strengthen your knee (physical therapy). You may need surgery if you have a severe tear or if other treatments are not working. Follow these instructions at home: If you have a brace, sleeve, or wrap:  Wear it as told by your health care provider. Remove it only as told by your health care provider.  Loosen the brace, sleeve, or wrap if your toes tingle, become numb, or turn cold and blue.  Keep the brace, sleeve, or wrap clean and dry.  If the brace, sleeve, or wrap is not waterproof: ? Do not let it get wet. ? Cover it with a watertight covering when you take a bath or shower. Managing pain and swelling   Take over-the-counter and prescription medicines only as told by your health care provider.  If directed, put ice on your knee: ? If you have a removable brace, sleeve, or wrap, remove it as told by your health care provider. ? Put ice in a  plastic bag. ? Place a towel between your skin and the bag. ? Leave the ice on for 20 minutes, 2-3 times per day.  Move your toes often to avoid stiffness and to lessen swelling.  Raise (elevate) the injured area above the level of your heart while you are sitting or lying down. Activity  Do not use the injured limb to support your body weight until your health care provider says that you can. Use crutches or a walker as told by your health care provider.  Return to your normal activities as told by your health care provider. Ask your health care provider what activities are safe for you.  Perform range-of-motion exercises only as told by your health care provider.  Begin doing exercises to strengthen your knee and leg muscles only as told  by your health care provider. After you recover, your health care provider may recommend these exercises to help prevent another injury. General instructions  Use a knee brace, sleeve, or wrap as told by your health care provider.  Ask your health care provider when it is safe to drive if you have a brace, sleeve, or wrap on your knee.  Do not use any products that contain nicotine or tobacco, such as cigarettes, e-cigarettes, and chewing tobacco. If you need help quitting, ask your health care provider.  Ask your health care provider if the medicine prescribed to you: ? Requires you to avoid driving or using heavy machinery. ? Can cause constipation. You may need to take these actions to prevent or treat constipation:  Drink enough fluid to keep your urine pale yellow.  Take over-the-counter or prescription medicines.  Eat foods that are high in fiber, such as beans, whole grains, and fresh fruits and vegetables.  Limit foods that are high in fat and processed sugars, such as fried or sweet foods.  Keep all follow-up visits as told by your health care provider. This is important. Contact a health care provider if:  You have a fever.  Your  knee becomes red, tender, or swollen.  Your pain medicine is not helping.  Your symptoms get worse or do not improve after 2 weeks of home care. Summary  A meniscus tear is a knee injury that happens when a piece of the meniscus is torn.  Treatment for this injury depends on the severity of the tear. You may need surgery if you have a severe tear or if other treatments are not working.  Rest, ice, and raise (elevate) your injured knee as told by your health care provider. This will help lessen pain and swelling.  Contact a health care provider if you have new symptoms, or your symptoms get worse or do not improve after 2 weeks of home care.  Keep all follow-up visits as told by your health care provider. This is important. This information is not intended to replace advice given to you by your health care provider. Make sure you discuss any questions you have with your health care provider. Document Released: 05/24/2002 Document Revised: 09/15/2017 Document Reviewed: 09/15/2017 Elsevier Patient Education  2020 [employer].   How to Use a Knee Brace  A knee brace is a device that you wear to support your knee, especially if you have arthritis or the knee is healing after an injury or surgery. There are several types of knee braces. Some are designed to prevent an injury (prophylactic brace). These are often worn during sports. Others support an injured knee (functional brace) or keep it still while it heals (rehabilitative brace). People with severe arthritis of the knee may benefit from a brace that takes some pressure off the knee (unloader brace). Most knee braces are made from a combination of cloth and metal or plastic. You may need to wear a knee brace:  To relieve knee pain.  To help your knee support your weight (improve stability).  To help you walk farther, or to move more easily (improve mobility).  To prevent injury.  To support your knee while it heals from surgery or  from an injury. What are the risks? Generally, knee braces are safe to wear. However, problems may occur, including:  Skin irritation that may cause pain and lead to infection.  Making your condition worse if you wear the brace in the wrong way. How to  use a knee brace Different braces will have different instructions for use. Your health care provider will tell you or show you:  How to put on your brace.  How to adjust the brace.  When and how often to wear the brace.  How to remove the brace.  If you need any assistive devices in addition to the brace, such as crutches or a cane. In general, your brace should:  Have the hinge of the brace line up with the bend of your knee.  Have straps, hooks, or tapes that fasten snugly around your leg.  Not feel too tight or too loose. How to care for a knee brace  Check your brace often for signs of damage, such as loose connections or attachments. Your knee brace may get damaged or wear out during normal use.  Wash the fabric parts of your brace with soap and water.  Read the insert that comes with your brace for other specific care instructions. Contact a health care provider if your knee brace:  Is too loose or too tight and you cannot adjust it.  Causes pain, skin redness, swelling, bruising, or irritation.  Is not helping to relieve your problem.  Is making your knee pain worse. Summary  A knee brace is a device that you wear to support your knee, especially if you have arthritis or your knee is healing after an injury or surgery.  Different braces will have different instructions for use. Your health care provider will tell you or show you how to use your knee brace.  Check your brace often for signs of damage, such as loose connections or attachments. Your knee brace may get damaged or wear out during normal use.  Wear your knee brace as told by your health care provider.  Contact a health care provider if your knee  brace is not helping to relieve your problem or is making your knee pain worse. This information is not intended to replace advice given to you by your health care provider. Make sure you discuss any questions you have with your health care provider. Document Released: 05/24/2003 Document Revised: 09/16/2017 Document Reviewed: 09/16/2017 Elsevier Patient Education  2020 [employer]

## 2019-04-13 ENCOUNTER — Ambulatory Visit (INDEPENDENT_AMBULATORY_CARE_PROVIDER_SITE_OTHER): Payer: BC Managed Care – PPO | Admitting: Family Medicine

## 2019-04-13 ENCOUNTER — Other Ambulatory Visit (HOSPITAL_COMMUNITY)
Admission: RE | Admit: 2019-04-13 | Discharge: 2019-04-13 | Disposition: A | Discharge status: Home / Self Care | Payer: BC Managed Care – PPO | Source: Ambulatory Visit | Attending: Family Medicine | Admitting: Family Medicine

## 2019-04-13 ENCOUNTER — Other Ambulatory Visit: Payer: Self-pay

## 2019-04-13 ENCOUNTER — Encounter: Payer: Self-pay | Admitting: Family Medicine

## 2019-04-13 VITALS — BP 104/60 | HR 97 | Temp 96.9°F | Resp 16 | Ht 70.0 in | Wt 244.1 lb

## 2019-04-13 DIAGNOSIS — R109 Unspecified abdominal pain: Secondary | ICD-10-CM | POA: Diagnosis not present

## 2019-04-13 DIAGNOSIS — M545 Low back pain, unspecified: Secondary | ICD-10-CM

## 2019-04-13 DIAGNOSIS — N898 Other specified noninflammatory disorders of vagina: Secondary | ICD-10-CM | POA: Insufficient documentation

## 2019-04-13 DIAGNOSIS — E559 Vitamin D deficiency, unspecified: Secondary | ICD-10-CM

## 2019-04-13 DIAGNOSIS — K219 Gastro-esophageal reflux disease without esophagitis: Secondary | ICD-10-CM

## 2019-04-13 MED ORDER — FAMOTIDINE 20 MG PO TABS: 20.0000 mg | ORAL_TABLET | Freq: Two times a day (BID) | ORAL | 1 refills | Status: DC

## 2019-04-13 MED ORDER — TRAMADOL HCL 50 MG PO TABS: 50.0000 mg | ORAL_TABLET | Freq: Three times a day (TID) | ORAL | 0 refills | Status: AC | PRN

## 2019-04-13 NOTE — Progress Notes (Signed)
Name: Anna Whitehead   MRN: [id number]    DOB: 05-23-1985   Date:04/13/2019       Progress Note  Subjective  Chief Complaint  Chief Complaint  Patient presents with  . Back Pain    HPI  Back pain/abdominal pain: Anna Whitehead came in today concerned about recurrent abdominal pain/back pain. She states cycles have always been heavy and in the past a provider discussed possible endometriosis. She had an IUD for a few years but removed it 6 months ago . She has been having cycles since, regular, but heavy with clots and also has cramps. This time symptoms a little different but similar to episode she had in 05/2016 when she came in to be evaluated. She states cramping started a few days prior to her cycle, on lower abdomen, in waves and sometimes intense. Her cycle started 6 days ago she continues to have the pain, also has pain on left lower back that makes it uncomfortable when laying down at night and sitting for a long time. She has mild vaginal discharge white in color. Discussed studies we have done in the past, she is currently trying to get pregnant and since no dysuria, hematuria, urinary frequency, change in bowel movements, weight loss, nausea or vomiting, fever or change in appetite we will hold off on imaging studies at this time. We will treat pain, check labs and refer to gyn.    Patient Active Problem List   Diagnosis Date Noted  . Depression, major, recurrent, moderate (HCC) 07/18/2016  . B12 deficiency 07/18/2016  . Vitamin D insufficiency 07/18/2016  . History of gestational hypertension 11/14/2015  . Asthma, mild 10/13/2014  . H/O high risk medication treatment 10/13/2014  . Overweight 10/13/2014  . Anxiety and depression 10/13/2014    Past Surgical History:  Procedure Laterality Date  . MYRINGOPLASTY    . NO PAST SURGERIES      Family History  Problem Relation Age of Onset  . Bipolar disorder Mother   . Cancer Maternal Aunt        3 of her aunts had breast cancer: 2  passed away     Current Outpatient Medications:  .  meloxicam (MOBIC) 15 MG tablet, Take 1 tablet (15 mg total) by mouth daily. (Patient not taking: Reported on 04/13/2019), Disp: 30 tablet, Rfl: 0  Allergies  Allergen Reactions  . Duloxetine Nausea Only    Dizziness     I personally reviewed active problem list, medication list, allergies, family history, social history, health maintenance with the patient/caregiver today.   ROS  Constitutional: Negative for fever or weight change.  Respiratory: Negative for cough and shortness of breath.   Cardiovascular: Negative for chest pain or palpitations.  Gastrointestinal: Negative for abdominal pain, no bowel changes.  Musculoskeletal: Negative for gait problem or joint swelling.  Skin: Negative for rash.  Neurological: Negative for dizziness or headache.  No other specific complaints in a complete review of systems (except as listed in HPI above).  Objective  Vitals:   04/13/19 1540  BP: 104/60  Pulse: 97  Resp: 16  Temp: (!) 96.9 F (36.1 C)  TempSrc: Temporal  SpO2: 98%  Weight: 244 lb 1.6 oz (110.7 kg)  Height: 5\' 10"  (1.778 m)    Body mass index is 35.02 kg/m.  Physical Exam  Constitutional: Patient appears well-developed and well-nourished. Obese  No distress.  HEENT: head atraumatic, normocephalic, pupils equal and reactive to light Cardiovascular: Normal rate, regular rhythm and normal heart sounds.  No murmur heard. No BLE edema. Pulmonary/Chest: Effort normal and breath sounds normal. No respiratory distress. Abdominal: Soft.  There is no tenderness. Negative CVA tenderness Muscular skeletal: normal gait , negative straight leg raise, mild pain during palpation of left lower back. Psychiatric: Patient has a normal mood and affect. behavior is normal. Judgment and thought content normal.   PHQ2/9: Depression screen [facility] 2/9 04/13/2019 02/16/2019 05/26/2018 02/24/2017 09/05/2016  Decreased Interest 0 0 0 0 1   Down, Depressed, Hopeless 0 0 0 0 1  PHQ - 2 Score 0 0 0 0 2  Altered sleeping 0 0 - - 0  Tired, decreased energy 0 0 - - 0  Change in appetite 0 0 - - 0  Feeling bad or failure about yourself  0 0 - - 0  Trouble concentrating 0 0 - - 0  Moving slowly or fidgety/restless 0 0 - - 0  Suicidal thoughts 0 0 - - 0  PHQ-9 Score 0 0 - - 2  Difficult doing work/chores - Not difficult at all - - Somewhat difficult    phq 9 is negative   Fall Risk: Fall Risk  04/13/2019 02/16/2019 05/26/2018 02/24/2017 10/13/2014  Falls in the past year? 0 0 0 No No  Number falls in past yr: 0 0 0 - -  Injury with Fall? 0 0 0 - -     Functional Status Survey: Is the patient deaf or have difficulty hearing?: No Does the patient have difficulty seeing, even when wearing glasses/contacts?: No Does the patient have difficulty concentrating, remembering, or making decisions?: No Does the patient have difficulty walking or climbing stairs?: No Does the patient have difficulty dressing or bathing?: No Does the patient have difficulty doing errands alone such as visiting a doctor's office or shopping?: No  Assessment & Plan  1. Abdominal cramping  - CULTURE, URINE COMPREHENSIVE - COMPLETE METABOLIC PANEL WITH GFR - CBC with Differential/Platelet - Ambulatory referral to Obstetrics / Gynecology  2. Intermittent low back pain  - Ambulatory referral to Obstetrics / Gynecology  3. Gastroesophageal reflux disease without esophagitis  - famotidine (PEPCID) 20 MG tablet; Take 1 tablet (20 mg total) by mouth 2 (two) times daily.  Dispense: 180 tablet; Refill: 1  4. Vitamin D deficiency  - VITAMIN D 25 Hydroxy (Vit-D Deficiency, Fractures)  5. Vaginal discharge  - Cervicovaginal ancillary only - Ambulatory referral to Obstetrics / Gynecology

## 2019-04-14 ENCOUNTER — Encounter: Payer: Self-pay | Admitting: Family Medicine

## 2019-04-15 LAB — CERVICOVAGINAL ANCILLARY ONLY
Bacterial Vaginitis (gardnerella): NEGATIVE
Candida Glabrata: NEGATIVE
Candida Vaginitis: NEGATIVE
Chlamydia: NEGATIVE
Comment: NEGATIVE
Comment: NEGATIVE
Comment: NEGATIVE
Comment: NEGATIVE
Comment: NEGATIVE
Comment: NORMAL
Neisseria Gonorrhea: NEGATIVE
Trichomonas: NEGATIVE

## 2019-04-15 NOTE — Telephone Encounter (Unsigned)
Copied from CRM [id number]. Topic: General - Inquiry >> Apr 15, 2019  2:44 PM Reggie Pile, NT wrote: Reason for CRM: Patient called in stating she would like to go over lab results with nurse or provider if possible as she has a few questions. Please advise.

## 2019-04-16 ENCOUNTER — Other Ambulatory Visit: Payer: Self-pay | Admitting: Family Medicine

## 2019-04-16 ENCOUNTER — Encounter: Payer: Self-pay | Admitting: Family Medicine

## 2019-04-16 MED ORDER — VITAMIN D (ERGOCALCIFEROL) 1.25 MG (50000 UNIT) PO CAPS: 50000.0000 [IU] | ORAL_CAPSULE | ORAL | 0 refills | Status: DC

## 2019-04-18 ENCOUNTER — Telehealth: Payer: Self-pay | Admitting: Obstetrics & Gynecology

## 2019-04-18 NOTE — Telephone Encounter (Signed)
[facility] referring for  cramping around cycle , heavy bleeding, possible endometriosis . Called and left voicemail for patient to call back to be schedule

## 2019-04-19 ENCOUNTER — Encounter: Payer: Self-pay | Admitting: Family Medicine

## 2019-04-19 LAB — CBC WITH DIFFERENTIAL/PLATELET
Absolute Monocytes: 484 cells/uL (ref 200–950)
Basophils Absolute: 39 cells/uL (ref 0–200)
Basophils Relative: 0.5 %
Eosinophils Absolute: 546 cells/uL — ABNORMAL HIGH (ref 15–500)
Eosinophils Relative: 7 %
HCT: 35.7 % (ref 35.0–45.0)
Hemoglobin: 10.9 g/dL — ABNORMAL LOW (ref 11.7–15.5)
Lymphs Abs: 2246 cells/uL (ref 850–3900)
MCH: 25.1 pg — ABNORMAL LOW (ref 27.0–33.0)
MCHC: 30.5 g/dL — ABNORMAL LOW (ref 32.0–36.0)
MCV: 82.3 fL (ref 80.0–100.0)
MPV: 10.6 fL (ref 7.5–12.5)
Monocytes Relative: 6.2 %
Neutro Abs: 4485 cells/uL (ref 1500–7800)
Neutrophils Relative %: 57.5 %
Platelets: 311 10*3/uL (ref 140–400)
RBC: 4.34 10*6/uL (ref 3.80–5.10)
RDW: 13.7 % (ref 11.0–15.0)
Total Lymphocyte: 28.8 %
WBC: 7.8 10*3/uL (ref 3.8–10.8)

## 2019-04-19 LAB — COMPLETE METABOLIC PANEL WITH GFR
AG Ratio: 2.2 (calc) (ref 1.0–2.5)
ALT: 12 U/L (ref 6–29)
AST: 15 U/L (ref 10–30)
Albumin: 4 g/dL (ref 3.6–5.1)
Alkaline phosphatase (APISO): 54 U/L (ref 31–125)
BUN: 9 mg/dL (ref 7–25)
CO2: 30 mmol/L (ref 20–32)
Calcium: 8.8 mg/dL (ref 8.6–10.2)
Chloride: 104 mmol/L (ref 98–110)
Creat: 0.73 mg/dL (ref 0.50–1.10)
GFR, Est African American: 127 mL/min/{1.73_m2} (ref >=60)
GFR, Est Non African American: 110 mL/min/{1.73_m2} (ref >=60)
Globulin: 1.8 g/dL (calc) — ABNORMAL LOW (ref 1.9–3.7)
Glucose, Bld: 92 mg/dL (ref 65–99)
Potassium: 4.4 mmol/L (ref 3.5–5.3)
Sodium: 140 mmol/L (ref 135–146)
Total Bilirubin: 0.2 mg/dL (ref 0.2–1.2)
Total Protein: 5.8 g/dL — ABNORMAL LOW (ref 6.1–8.1)

## 2019-04-19 LAB — CULTURE, URINE COMPREHENSIVE
MICRO NUMBER:: 10089980
SPECIMEN QUALITY:: ADEQUATE

## 2019-04-19 LAB — TEST AUTHORIZATION

## 2019-04-19 LAB — IRON,TIBC AND FERRITIN PANEL
%SAT: 5 % (calc) — ABNORMAL LOW (ref 16–45)
Ferritin: 4 ng/mL — ABNORMAL LOW (ref 16–154)
Iron: 21 ug/dL — ABNORMAL LOW (ref 40–190)
TIBC: 416 mcg/dL (calc) (ref 250–450)

## 2019-04-19 LAB — VITAMIN D 25 HYDROXY (VIT D DEFICIENCY, FRACTURES): Vit D, 25-Hydroxy: 15 ng/mL — ABNORMAL LOW (ref 30–100)

## 2019-04-19 NOTE — Telephone Encounter (Signed)
Called and left voice mail for patient to call back to be schedule °

## 2019-04-20 ENCOUNTER — Other Ambulatory Visit: Payer: Self-pay | Admitting: Family Medicine

## 2019-04-20 MED ORDER — NITROFURANTOIN MONOHYD MACRO 100 MG PO CAPS: 100.0000 mg | ORAL_CAPSULE | Freq: Two times a day (BID) | ORAL | 0 refills | Status: DC

## 2019-04-21 ENCOUNTER — Other Ambulatory Visit: Payer: Self-pay | Admitting: Family Medicine

## 2019-04-21 ENCOUNTER — Encounter: Payer: Self-pay | Admitting: Family Medicine

## 2019-04-21 DIAGNOSIS — R1030 Lower abdominal pain, unspecified: Secondary | ICD-10-CM

## 2019-04-21 NOTE — Telephone Encounter (Signed)
Called and left voice mail for patient to call back to be schedule °

## 2019-04-21 NOTE — Telephone Encounter (Signed)
Patient scheduled 2/24 with [facility].

## 2019-05-11 ENCOUNTER — Other Ambulatory Visit (HOSPITAL_COMMUNITY)
Admission: RE | Admit: 2019-05-11 | Discharge: 2019-05-11 | Disposition: A | Discharge status: Home / Self Care | Payer: BC Managed Care – PPO | Source: Ambulatory Visit | Attending: Obstetrics and Gynecology | Admitting: Obstetrics and Gynecology

## 2019-05-11 ENCOUNTER — Other Ambulatory Visit: Payer: Self-pay

## 2019-05-11 ENCOUNTER — Ambulatory Visit (INDEPENDENT_AMBULATORY_CARE_PROVIDER_SITE_OTHER): Payer: BC Managed Care – PPO | Admitting: Obstetrics and Gynecology

## 2019-05-11 ENCOUNTER — Encounter: Payer: Self-pay | Admitting: Obstetrics and Gynecology

## 2019-05-11 VITALS — BP 104/62 | Ht 70.0 in | Wt 242.0 lb

## 2019-05-11 DIAGNOSIS — N888 Other specified noninflammatory disorders of cervix uteri: Secondary | ICD-10-CM

## 2019-05-11 DIAGNOSIS — Z124 Encounter for screening for malignant neoplasm of cervix: Secondary | ICD-10-CM

## 2019-05-11 DIAGNOSIS — R102 Pelvic and perineal pain: Secondary | ICD-10-CM

## 2019-05-11 DIAGNOSIS — N852 Hypertrophy of uterus: Secondary | ICD-10-CM | POA: Diagnosis not present

## 2019-05-11 MED ORDER — NAPROXEN 500 MG PO TABS: 500.0000 mg | ORAL_TABLET | Freq: Two times a day (BID) | ORAL | 2 refills | Status: DC

## 2019-05-11 NOTE — Progress Notes (Signed)
Patient ID: Anna Whitehead, female   DOB: 1985/10/04, 34 y.o.   MRN: [id number]  Reason for Consult: Referral (Trying to conceive. Severe cramping, heavy bleeding, and possible endometriosis. Pain radiates to back area )   Referred by Alba Cory, MD  Subjective:     HPI:  Anna Whitehead is a 34 y.o. female she is here today for a consultation regarding pelvic pain.  She reports that she has had constant abdominal and pelvic pain for about 1 month.  She reports that the pain feels like cramps that comes and goes in waves and that sitting sometimes makes it worse it radiates to her back and is generally along the left side.    She has regular menstrual cycles generally every 28 days for 7 days.  She reports that her periods are normally light and that she will sometimes have large clots she uses a tampon and pad which she changes about every hour when it is at its heaviest.  She and her partner are attempting to conceive.  She had an IUD which was removed in June 2020.  It had been in place for about 3 years.  During this time her periods were manageable and she did not have pelvic pain.  Since the IUD has been removed her symptoms of pain have worsened.  She feels the pain in her lower abdomen and left lower quadrant.  She had a similar pain several years ago.  She denies any family history or personal history of endometriosis.  She reports that she entered menarche in fifth grade she took oral contraceptive pills as a teen because her periods were always heavy and painful she is previously used the patch and Depo-Provera as well.  She has found that an IUD works the best for controlling symptoms of her menstrual cycle.  She does have a history of anemia.  She has not required blood transfusions for anemia in the past.  She does report some flooding of clots on her menstrual cycle.  She does not usually have to miss work or school because of her menstrual cycle.  She has been taking  ibuprofen 800 mg every 4 hours when she is on her menstrual cycle.  She denies any blood in her stool.  She denies nausea or vomiting.  She reports some constipation.  She and her partner are actively trying to conceive.  She had her IUD removed in June although they have been trying with timed intercourse since the new year.  Gynecological History Menarche: Fifth grade Menopause: Not applicable LMP: 05/07/2019 Last pap smear: Unknown no history of abnormal Pap smears in the past History of STDs: Denies Sexually Active: Yes female partner.  Denies pain with intercourse. No history of fibroids polyps or ovarian cyst.  Obstetrical History 08/14/2014 spontaneous vaginal delivery full-term infant 38 weeks pregnancy complicated by gestational hypertension hyperemesis gravidarum with weight loss and GERD  Past Medical History:  Diagnosis Date  . Allergic eczema   . Anxiety   . Depression   . Dysmenorrhea   . Iron deficiency anemia due to chronic blood loss   . Menorrhagia   . Midline low back pain with left-sided sciatica   . Mild asthma    seasonal, not used recently  . Overweight (BMI 25.0-29.9)   . Right foot pain   . Seasonal allergic rhinitis    Family History  Problem Relation Age of Onset  . Bipolar disorder Mother   . Cancer Maternal Aunt  3 of her aunts had breast cancer: 2 passed away   Past Surgical History:  Procedure Laterality Date  . MYRINGOPLASTY     Did not happen  . NO PAST SURGERIES      Short Social History:  Social History   Tobacco Use  . Smoking status: Never Smoker  . Smokeless tobacco: Never Used  Substance Use Topics  . Alcohol use: Yes    Alcohol/week: 0.0 standard drinks    Comment: ocassionally    Allergies  Allergen Reactions  . Duloxetine Nausea Only    Dizziness     Current Outpatient Medications  Medication Sig Dispense Refill  . famotidine (PEPCID) 20 MG tablet Take 1 tablet (20 mg total) by mouth 2 (two) times daily. 180  tablet 1  . Vitamin D, Ergocalciferol, (DRISDOL) 1.25 MG (50000 UNIT) CAPS capsule Take 1 capsule (50,000 Units total) by mouth every 7 (seven) days. 4 capsule 0  . naproxen (NAPROSYN) 500 MG tablet Take 1 tablet (500 mg total) by mouth 2 (two) times daily with a meal. As needed for pain 60 tablet 2  . nitrofurantoin, macrocrystal-monohydrate, (MACROBID) 100 MG capsule Take 1 capsule (100 mg total) by mouth 2 (two) times daily. 10 capsule 0   No current facility-administered medications for this visit.    Review of Systems  Constitutional: Negative for chills, fatigue, fever and unexpected weight change.  HENT: Negative for trouble swallowing.  Eyes: Negative for loss of vision.  Respiratory: Negative for cough, shortness of breath and wheezing.  Cardiovascular: Negative for chest pain, leg swelling, palpitations and syncope.  GI: Negative for abdominal pain, blood in stool, diarrhea, nausea and vomiting.  GU: Negative for difficulty urinating, dysuria, frequency and hematuria.  Musculoskeletal: Negative for back pain, leg pain and joint pain.  Skin: Negative for rash.  Neurological: Negative for dizziness, headaches, light-headedness, numbness and seizures.  Psychiatric: Negative for behavioral problem, confusion, depressed mood and sleep disturbance.        Objective:  Objective   Vitals:   05/11/19 1445  BP: 104/62  Weight: 242 lb (109.8 kg)  Height: 5\' 10"  (1.778 m)   Body mass index is 34.72 kg/m.  Physical Exam Vitals and nursing note reviewed.  Constitutional:      Appearance: She is well-developed.  HENT:     Head: Normocephalic and atraumatic.  Eyes:     Pupils: Pupils are equal, round, and reactive to light.  Cardiovascular:     Rate and Rhythm: Normal rate and regular rhythm.  Pulmonary:     Effort: Pulmonary effort is normal. No respiratory distress.  Genitourinary:    Comments: External: Vulva normal. No lesions noted.  Speculum examination:  Cervix  friable, deep difficult to visualize, tenaculum used . No blood in the vaginal vault. No discharge.  Bimanual examination: Uterus tender, deviated to the left, slightly enlarged 11 cm in size  Some CMT. Right adnexa normal. Left adnexa more fixed and difficult to access.  Skin:    General: Skin is warm and dry.  Neurological:     Mental Status: She is alert and oriented to person, place, and time.  Psychiatric:        Behavior: Behavior normal.        Thought Content: Thought content normal.        Judgment: Judgment normal.         Assessment/Plan:     34 year old with history of acute and chronic pelvic pain. Discussed that given her history of pelvic  pain which was improved and controlled with an IUD that it is possible that she could have endometriosis.  Discussed that diagnosis of endometriosis would be involved a laparoscopy with biopsy.  Discussed signs and symptoms of endometriosis.  Since the patient is trying to conceive she is not interested in laparoscopy at this time.  Uterus is enlarged and deviated to the left.  This could be secondary to adhesions.  Will have patient follow-up for pelvic ultrasound can consider further pelvic imaging if necessary.   Given that she has been able to successfully conceive in the past would anticipate that she will be able to conceive in the near future.  Discussed that if she is not able to conceive within a year trying that evaluation for infertility should be considered.  Discussed timing of intercourse for optimal chances of conception.  Encourage patient to continue taking prenatal vitamin.  Discussed NSAID usage with patient.  She is currently been taking over the recommended amounts of ibuprofen.  Sent prescription for naproxen which she can use as an alternative to the ibuprofen.   Pap smear updated today.  More than 30 minutes were spent face to face with the patient in the room with more than 50% of the time spent providing  counseling and discussing the plan of management.      Adrian Prows MD [facility], [facility] 05/11/2019 3:46 PM

## 2019-05-11 NOTE — Patient Instructions (Signed)
Endometriosis  Endometriosis is a condition in which the tissue that lines the uterus (endometrium) grows outside of its normal location. The tissue may grow in many locations close to the uterus, but it commonly grows on the ovaries, fallopian tubes, vagina, or bowel. When the uterus sheds the endometrium every menstrual cycle, there is bleeding wherever the endometrial tissue is located. This can cause pain because blood is irritating to tissues that are not normally exposed to it. What are the causes? The cause of endometriosis is not known. What increases the risk? You may be more likely to develop endometriosis if you:  Have a family history of endometriosis.  Have never given birth.  Started your period at age 10 or younger.  Have high levels of estrogen in your body.  Were exposed to a certain medicine (diethylstilbestrol) before you were born (in utero).  Had low birth weight.  Were born as a twin, triplet, or other multiple.  Have a BMI of less than 25. BMI is an estimate of body fat and is calculated from height and weight. What are the signs or symptoms? Often, there are no symptoms of this condition. If you do have symptoms, they may:  Vary depending on where your endometrial tissue is growing.  Occur during your menstrual period (most common) or midcycle.  Come and go, or you may go months with no symptoms at all.  Stop with menopause. Symptoms may include:  Pain in the back or abdomen.  Heavier bleeding during periods.  Pain during sex.  Painful bowel movements.  Infertility.  Pelvic pain.  Bleeding more than once a month. How is this diagnosed? This condition is diagnosed based on your symptoms and a physical exam. You may have tests, such as:  Blood tests and urine tests. These may be done to help rule out other possible causes of your symptoms.  Ultrasound, to look for abnormal tissues.  An X-ray of the lower bowel (barium enema).  An  ultrasound that is done through the vagina (transvaginally).  CT scan.  MRI.  Laparoscopy. In this procedure, a lighted, pencil-sized instrument called a laparoscope is inserted into your abdomen through an incision. The laparoscope allows your health care provider to look at the organs inside your body and check for abnormal tissue to confirm the diagnosis. If abnormal tissue is found, your health care provider may remove a small piece of tissue (biopsy) to be examined under a microscope. How is this treated? Treatment for this condition may include:  Medicines to relieve pain, such as NSAIDs.  Hormone therapy. This involves using artificial (synthetic) hormones to reduce endometrial tissue growth. Your health care provider may recommend using a hormonal form of birth control, or other medicines.  Surgery. This may be done to remove abnormal endometrial tissue. ? In some cases, tissue may be removed using a laparoscope and a laser (laparoscopic laser treatment). ? In severe cases, surgery may be done to remove the fallopian tubes, uterus, and ovaries (hysterectomy). Follow these instructions at home:  Take over-the-counter and prescription medicines only as told by your health care provider.  Do not drive or use heavy machinery while taking prescription pain medicine.  Try to avoid activities that cause pain, including sexual activity.  Keep all follow-up visits as told by your health care provider. This is important. Contact a health care provider if:  You have pain in the area between your hip bones (pelvic area) that occurs: ? Before, during, or after your period. ?   In between your period and gets worse during your period. ? During or after sex. ? With bowel movements or urination, especially during your period.  You have problems getting pregnant.  You have a fever. Get help right away if:  You have severe pain that does not get better with medicine.  You have severe  nausea and vomiting, or you cannot eat without vomiting.  You have pain that affects only the lower, right side of your abdomen.  You have abdominal pain that gets worse.  You have abdominal swelling.  You have blood in your stool. This information is not intended to replace advice given to you by your health care provider. Make sure you discuss any questions you have with your health care provider. Document Revised: 02/13/2017 Document Reviewed: 08/04/2015 Elsevier Patient Education  2020 [employer]vier Inc.  

## 2019-05-13 LAB — CYTOLOGY - PAP
Comment: NEGATIVE
Diagnosis: NEGATIVE
High risk HPV: NEGATIVE

## 2019-05-13 NOTE — Progress Notes (Signed)
Please call patient with normal pap smear

## 2019-05-26 ENCOUNTER — Encounter: Payer: Self-pay | Admitting: Family Medicine

## 2019-05-27 ENCOUNTER — Ambulatory Visit (INDEPENDENT_AMBULATORY_CARE_PROVIDER_SITE_OTHER): Payer: BC Managed Care – PPO | Admitting: Obstetrics and Gynecology

## 2019-05-27 ENCOUNTER — Encounter: Payer: Self-pay | Admitting: Obstetrics and Gynecology

## 2019-05-27 ENCOUNTER — Ambulatory Visit (INDEPENDENT_AMBULATORY_CARE_PROVIDER_SITE_OTHER): Payer: BC Managed Care – PPO

## 2019-05-27 ENCOUNTER — Other Ambulatory Visit: Payer: Self-pay

## 2019-05-27 VITALS — BP 118/70 | Ht 70.0 in | Wt 243.0 lb

## 2019-05-27 DIAGNOSIS — N946 Dysmenorrhea, unspecified: Secondary | ICD-10-CM

## 2019-05-27 DIAGNOSIS — N920 Excessive and frequent menstruation with regular cycle: Secondary | ICD-10-CM | POA: Diagnosis not present

## 2019-05-27 DIAGNOSIS — R102 Pelvic and perineal pain: Secondary | ICD-10-CM

## 2019-05-29 NOTE — Progress Notes (Signed)
Patient ID: Anna Whitehead, female   DOB: April 08, 1985, 34 y.o.   MRN: [id number]  Reason for Consult: Follow-up (U/S follow up )   Referred by Alba Cory, MD  Subjective:     HPI:  Anna Whitehead is a 34 y.o. female. She is following up today for an [place] after her recent consultation for pelvic pain and menorrhagia, dysmenorrhea. She has no new complaints.   Past Medical History:  Diagnosis Date  . Allergic eczema   . Anxiety   . Depression   . Dysmenorrhea   . Iron deficiency anemia due to chronic blood loss   . Menorrhagia   . Midline low back pain with left-sided sciatica   . Mild asthma    seasonal, not used recently  . Overweight (BMI 25.0-29.9)   . Right foot pain   . Seasonal allergic rhinitis    Family History  Problem Relation Age of Onset  . Bipolar disorder Mother   . Cancer Maternal Aunt        3 of her aunts had breast cancer: 2 passed away   Past Surgical History:  Procedure Laterality Date  . MYRINGOPLASTY     Did not happen  . NO PAST SURGERIES      Short Social History:  Social History   Tobacco Use  . Smoking status: Never Smoker  . Smokeless tobacco: Never Used  Substance Use Topics  . Alcohol use: Yes    Alcohol/week: 0.0 standard drinks    Comment: ocassionally    Allergies  Allergen Reactions  . Duloxetine Nausea Only    Dizziness     Current Outpatient Medications  Medication Sig Dispense Refill  . famotidine (PEPCID) 20 MG tablet Take 1 tablet (20 mg total) by mouth 2 (two) times daily. 180 tablet 1  . naproxen (NAPROSYN) 500 MG tablet Take 1 tablet (500 mg total) by mouth 2 (two) times daily with a meal. As needed for pain 60 tablet 2  . Vitamin D, Ergocalciferol, (DRISDOL) 1.25 MG (50000 UNIT) CAPS capsule Take 1 capsule (50,000 Units total) by mouth every 7 (seven) days. 4 capsule 0   No current facility-administered medications for this visit.    Review of Systems  Constitutional: Negative for chills, fatigue,  fever and unexpected weight change.  HENT: Negative for trouble swallowing.  Eyes: Negative for loss of vision.  Respiratory: Negative for cough, shortness of breath and wheezing.  Cardiovascular: Negative for chest pain, leg swelling, palpitations and syncope.  GI: Negative for abdominal pain, blood in stool, diarrhea, nausea and vomiting.  GU: Negative for difficulty urinating, dysuria, frequency and hematuria.  Musculoskeletal: Negative for back pain, leg pain and joint pain.  Skin: Negative for rash.  Neurological: Negative for dizziness, headaches, light-headedness, numbness and seizures.  Psychiatric: Negative for behavioral problem, confusion, depressed mood and sleep disturbance.        Objective:  Objective   Vitals:   05/27/19 1612  BP: 118/70  Weight: 243 lb (110.2 kg)  Height: 5\' 10"  (1.778 m)   Body mass index is 34.87 kg/m.  Physical Exam Vitals and nursing note reviewed.  Constitutional:      Appearance: She is well-developed.  HENT:     Head: Normocephalic and atraumatic.  Eyes:     Pupils: Pupils are equal, round, and reactive to light.  Cardiovascular:     Rate and Rhythm: Normal rate and regular rhythm.  Pulmonary:     Effort: Pulmonary effort is normal. No respiratory distress.  Skin:    General: Skin is warm and dry.  Neurological:     Mental Status: She is alert and oriented to person, place, and time.  Psychiatric:        Behavior: Behavior normal.        Thought Content: Thought content normal.        Judgment: Judgment normal.        Assessment/Plan:     34 yo with pelvic pain and menorrhagia, dysmenorrhea US findings today can be suggestive of adenomyosis. Discussed that a pelvic MRI can further help diagnose this condition, but only pathology after a hysterectomy is ultimately definitive.  Medical management has worked for her in the past and she will resume medical management after pregnancy.  She has been offered laparoscopy, but she  is attempting to conceive and is not currently interested in a laparoscopy.   More than 15 minutes were spent face to face with the patient in the room with more than 50% of the time spent providing counseling and discussing the plan of management.    Adrian Prows MD [facility], [facility] 05/29/2019 9:57 AM

## 2019-06-10 ENCOUNTER — Ambulatory Visit: Payer: Self-pay

## 2019-06-10 NOTE — Telephone Encounter (Signed)
Patient called stating that she is [redacted] weeks pregnant and was wondering if Prilosec is safe to take during pregnancy. Patient was advised to contact her OB GYN for information. She was also informed that her pharmacist is also a good source of information.   She verbalized understanding. Will route to to Dr Carlynn Purl. Reason for Disposition . [1] Caller requesting NON-URGENT health information AND [2] PCP's office is the best resource  Answer Assessment - Initial Assessment Questions 1. REASON FOR CALL or QUESTION: "What is your reason for calling today?" or "How can I best help you?" or "What question do you have that I can help answer?"     Patient called stating that she is pregnant and taking Prilosec.  She wants to know if this is safe while pregnant.  Protocols used: INFORMATION ONLY CALL - NO TRIAGE-A-AH

## 2019-06-14 NOTE — Telephone Encounter (Signed)
Tried to call patient to let her know that medication was not recommended no answer, no vm.

## 2019-06-15 NOTE — Progress Notes (Deleted)
Pap 05/11/2019 NIL HPV negative No LMP recorded.  [id number]

## 2019-06-16 ENCOUNTER — Other Ambulatory Visit (HOSPITAL_COMMUNITY)
Admission: RE | Admit: 2019-06-16 | Discharge: 2019-06-16 | Disposition: A | Discharge status: Home / Self Care | Payer: BC Managed Care – PPO | Source: Ambulatory Visit | Attending: Obstetrics and Gynecology | Admitting: Obstetrics and Gynecology

## 2019-06-16 ENCOUNTER — Ambulatory Visit (INDEPENDENT_AMBULATORY_CARE_PROVIDER_SITE_OTHER): Payer: BC Managed Care – PPO | Admitting: Obstetrics and Gynecology

## 2019-06-16 ENCOUNTER — Encounter: Payer: Self-pay | Admitting: Obstetrics and Gynecology

## 2019-06-16 ENCOUNTER — Encounter: Payer: BC Managed Care – PPO | Admitting: Obstetrics and Gynecology

## 2019-06-16 ENCOUNTER — Other Ambulatory Visit: Payer: Self-pay

## 2019-06-16 VITALS — BP 118/90 | HR 57 | Wt 240.0 lb

## 2019-06-16 DIAGNOSIS — Z3480 Encounter for supervision of other normal pregnancy, unspecified trimester: Secondary | ICD-10-CM

## 2019-06-16 DIAGNOSIS — N912 Amenorrhea, unspecified: Secondary | ICD-10-CM | POA: Diagnosis not present

## 2019-06-16 DIAGNOSIS — Z113 Encounter for screening for infections with a predominantly sexual mode of transmission: Secondary | ICD-10-CM | POA: Diagnosis present

## 2019-06-16 DIAGNOSIS — O0990 Supervision of high risk pregnancy, unspecified, unspecified trimester: Secondary | ICD-10-CM | POA: Insufficient documentation

## 2019-06-16 DIAGNOSIS — Z3A01 Less than 8 weeks gestation of pregnancy: Secondary | ICD-10-CM

## 2019-06-16 DIAGNOSIS — O09299 Supervision of pregnancy with other poor reproductive or obstetric history, unspecified trimester: Secondary | ICD-10-CM | POA: Insufficient documentation

## 2019-06-16 DIAGNOSIS — O09291 Supervision of pregnancy with other poor reproductive or obstetric history, first trimester: Secondary | ICD-10-CM

## 2019-06-16 DIAGNOSIS — Z3689 Encounter for other specified antenatal screening: Secondary | ICD-10-CM

## 2019-06-16 LAB — POCT URINE PREGNANCY: Preg Test, Ur: POSITIVE — AB

## 2019-06-16 NOTE — Progress Notes (Signed)
New Obstetric Patient H&P    Chief Complaint: "Desires prenatal care"   History of Present Illness: Patient is a 34 y.o. G2P1001 Not Hispanic or Latino female, presents with amenorrhea and positive home pregnancy test. Patient's last menstrual period was 05/07/2019 (exact date). and based on her  LMP, her EDD is Estimated Date of Delivery: 02/11/20 and her EGA is [redacted]w[redacted]d. Cycles are regular monthly. Her last pap smear was on 05/11/2019 and normal.   She had a urine pregnancy test which was positive 1 week(s)  ago. Her last menstrual period was normal. Since her LMP she claims she has experienced fatigue. She denies vaginal bleeding. Her past medical history is noncontributory. Her prior pregnancies are notable for preeclampsia at 38 weeks, hyperemesis  Since her LMP, she admits to the use of tobacco products  no There are cats in the home in the home  no  She admits close contact with children on a regular basis  yes  She has had chicken pox in the past yes She has had Tuberculosis exposures, symptoms, or previously tested positive for TB   no Current or past history of domestic violence. no  Genetic Screening/Teratology Counseling: (Includes patient, baby's father, or anyone in either family with:)   1. Patient's age >/= 73 at [facility]  no 2. Thalassemia ([place], [place], Mediterranean, or Asian background): MCV<80  no 3. Neural tube defect (meningomyelocele, spina bifida, anencephaly)  no 4. Congenital heart defect  no  5. Down syndrome  no 6. Tay-Sachs (Jewish, [place]  no 7. Canavan's Disease  no 8. Sickle cell disease or trait (African)  no  9. Hemophilia or other blood disorders  no  10. Muscular dystrophy  no  11. Cystic fibrosis  no  12. Huntington's Chorea  no  13. Mental retardation/autism  no 14. Other inherited genetic or chromosomal disorder  no 15. Maternal metabolic disorder (DM, PKU, etc)  no 16. Patient or FOB with a child with a birth defect not listed above no    16a. Patient or FOB with a birth defect themselves no 17. Recurrent pregnancy loss, or stillbirth  no  18. Any medications since LMP other than prenatal vitamins (include vitamins, supplements, OTC meds, drugs, alcohol)  no 19. Any other genetic/environmental exposure to discuss  no  Infection History:   1. Lives with someone with TB or TB exposed  no  2. Patient or partner has history of genital herpes  no 3. Rash or viral illness since LMP  no 4. History of STI (GC, CT, HPV, syphilis, HIV)  no 5. History of recent travel :  no  Other pertinent information:  no     Review of Systems:10 point review of systems negative unless otherwise noted in HPI  Past Medical History:  Patient Active Problem List   Diagnosis Date Noted  . Depression, major, recurrent, moderate (HCC) 07/18/2016  . B12 deficiency 07/18/2016  . Vitamin D insufficiency 07/18/2016  . History of gestational hypertension 11/14/2015  . Asthma, mild 10/13/2014  . H/O high risk medication treatment 10/13/2014  . Overweight 10/13/2014  . Anxiety and depression 10/13/2014    Past Surgical History:  Past Surgical History:  Procedure Laterality Date  . MYRINGOPLASTY     Did not happen  . NO PAST SURGERIES      Gynecologic History: Patient's last menstrual period was 05/07/2019 (exact date).  Obstetric History: G2P1001  Family History:  Family History  Problem Relation Age of Onset  . Bipolar disorder  Mother   . Cancer Maternal Aunt        3 of her aunts had breast cancer: 2 passed away    Social History:  Social History   Socioeconomic History  . Marital status: Married    Spouse name: Para March  . Number of children: 1  . Years of education: Not on file  . Highest education level: Bachelor's degree (e.g., BA, AB, BS)  Occupational History  . Occupation: Runner, broadcasting/film/video  Tobacco Use  . Smoking status: Never Smoker  . Smokeless tobacco: Never Used  Substance and Sexual Activity  . Alcohol use: Yes     Alcohol/week: 0.0 standard drinks    Comment: ocassionally  . Drug use: No  . Sexual activity: Yes    Partners: Male    Birth control/protection: None  Other Topics Concern  . Not on file  Social History Narrative   Married, first son was born 11/13/2016      Patient is getting her Master's this summer   Social Determinants of Health   Financial Resource Strain:   . Difficulty of Paying Living Expenses:   Food Insecurity:   . Worried About Programme researcher, broadcasting/film/video in the Last Year:   . Barista in the Last Year:   Transportation Needs:   . Freight forwarder (Medical):   Marland Kitchen Lack of Transportation (Non-Medical):   Physical Activity:   . Days of Exercise per Week:   . Minutes of Exercise per Session:   Stress:   . Feeling of Stress :   Social Connections:   . Frequency of Communication with Friends and Family:   . Frequency of Social Gatherings with Friends and Family:   . Attends Religious Services:   . Active Member of Clubs or Organizations:   . Attends Banker Meetings:   Marland Kitchen Marital Status:   Intimate Partner Violence:   . Fear of Current or Ex-Partner:   . Emotionally Abused:   Marland Kitchen Physically Abused:   . Sexually Abused:     Allergies:  Allergies  Allergen Reactions  . Duloxetine Nausea Only    Dizziness     Medications: Prior to Admission medications   Medication Sig Start Date End Date Taking? Authorizing Provider  famotidine (PEPCID) 20 MG tablet Take 1 tablet (20 mg total) by mouth 2 (two) times daily. 04/13/19   Alba Cory, MD  naproxen (NAPROSYN) 500 MG tablet Take 1 tablet (500 mg total) by mouth 2 (two) times daily with a meal. As needed for pain 05/11/19   Schuman, Christanna R, MD  Vitamin D, Ergocalciferol, (DRISDOL) 1.25 MG (50000 UNIT) CAPS capsule Take 1 capsule (50,000 Units total) by mouth every 7 (seven) days. 04/16/19   Alba Cory, MD    Physical Exam Vitals: Last menstrual period 05/07/2019.  General: NAD HEENT:  normocephalic, anicteric Pulmonary: No increased work of breathing, CTAB Genitourinary:  External: Normal external female genitalia.  Normal urethral meatus, normal  Bartholin's and Skene's glands.    Vagina: Normal vaginal mucosa, no evidence of prolapse.    Rectal: deferred Extremities: no edema, erythema, or tenderness Neurologic: Grossly intact Psychiatric: mood appropriate, affect full   Assessment: 34 y.o. G2P1001 at [redacted]w[redacted]d presenting to initiate prenatal care  Plan: 1) Avoid alcoholic beverages. 2) Patient encouraged not to smoke.  3) Discontinue the use of all non-medicinal drugs and chemicals.  4) Take prenatal vitamins daily.  5) Nutrition, food safety (fish, cheese advisories, and high nitrite foods) and exercise discussed. 6)  Hospital and practice style discussed with cross coverage system.  7) Genetic Screening, such as with 1st Trimester Screening, cell free fetal DNA, AFP testing, and Ultrasound, as well as with amniocentesis and CVS as appropriate, is discussed with patient. At the conclusion of today's visit patient undecided genetic testing 8) Dating scan ordered  Malachy Mood, MD, [place], [place] 06/16/2019, 3:44 PM

## 2019-06-16 NOTE — Progress Notes (Signed)
NOB 

## 2019-06-18 LAB — PROTEIN / CREATININE RATIO, URINE
Creatinine, Urine: 35.2 mg/dL
Protein, Ur: <4 mg/dL

## 2019-06-18 LAB — URINE CULTURE

## 2019-06-20 ENCOUNTER — Encounter: Payer: BC Managed Care – PPO | Admitting: Obstetrics and Gynecology

## 2019-06-20 ENCOUNTER — Ambulatory Visit (INDEPENDENT_AMBULATORY_CARE_PROVIDER_SITE_OTHER): Payer: BC Managed Care – PPO | Admitting: Obstetrics and Gynecology

## 2019-06-20 ENCOUNTER — Other Ambulatory Visit: Payer: Self-pay

## 2019-06-20 ENCOUNTER — Ambulatory Visit (INDEPENDENT_AMBULATORY_CARE_PROVIDER_SITE_OTHER): Payer: BC Managed Care – PPO

## 2019-06-20 ENCOUNTER — Other Ambulatory Visit: Payer: BC Managed Care – PPO

## 2019-06-20 ENCOUNTER — Encounter: Payer: Self-pay | Admitting: Obstetrics and Gynecology

## 2019-06-20 VITALS — BP 110/80 | Wt 237.0 lb

## 2019-06-20 DIAGNOSIS — Z3689 Encounter for other specified antenatal screening: Secondary | ICD-10-CM

## 2019-06-20 DIAGNOSIS — Z3A01 Less than 8 weeks gestation of pregnancy: Secondary | ICD-10-CM

## 2019-06-20 DIAGNOSIS — R739 Hyperglycemia, unspecified: Secondary | ICD-10-CM

## 2019-06-20 DIAGNOSIS — Z3480 Encounter for supervision of other normal pregnancy, unspecified trimester: Secondary | ICD-10-CM

## 2019-06-20 DIAGNOSIS — Z3481 Encounter for supervision of other normal pregnancy, first trimester: Secondary | ICD-10-CM | POA: Diagnosis not present

## 2019-06-20 DIAGNOSIS — R112 Nausea with vomiting, unspecified: Secondary | ICD-10-CM

## 2019-06-20 DIAGNOSIS — O219 Vomiting of pregnancy, unspecified: Secondary | ICD-10-CM

## 2019-06-20 LAB — POCT URINALYSIS DIPSTICK OB
Glucose, UA: NEGATIVE
POC,PROTEIN,UA: NEGATIVE

## 2019-06-20 LAB — CERVICOVAGINAL ANCILLARY ONLY
Chlamydia: NEGATIVE
Comment: NEGATIVE
Comment: NORMAL
Neisseria Gonorrhea: NEGATIVE

## 2019-06-20 MED ORDER — PROMETHAZINE HCL 25 MG PO TABS: 25.0000 mg | ORAL_TABLET | Freq: Four times a day (QID) | ORAL | 2 refills | Status: DC | PRN

## 2019-06-20 NOTE — Patient Instructions (Addendum)
Initial steps to help :   B6 (pyridoxine) 25 mg,  3-4 times a day (150mg  total a day) Unisom (doxylamine) 25 mg at bedtime **B6 and Unisom are available as a combination prescription medications called diclegis and bonjesta  B1 (thiamin)  50-100 mg 1-4 a day  (150mg  total a day)  Continue prenatal vitamin with iron and thiamin. If it is not tolerated switch to 1 mg of folic acid.  Can add medication for gastric reflux if needed.  Subsequent steps to be added to B1, B6, and Unisom:  1. Antihistamine (one of the following medications) Dramamine      25-50 mg every 4-6 hours Benadryl      25-50 mg every 4-6 hours Meclizine      25 mg every 6 hours  2. Dopamine Antagonist (one of the following medications) Metoclopramide  (Reglan)  5-10 mg every 6-8 hours         PO Promethazine   (Phenergan)   12.5-25 mg every 4-6 hours      PO or rectal Prochlorperazine  (Compazine)  5-10 mg every 6-8 hours     25mg  BID rectally    Subsequent steps if there has still not been improvement in symptoms:  3. Daily stool softner  4. Ondansetron  (Zofran)   4-8 mg every 6-8 hours    Hyperemesis Gravidarum Hyperemesis gravidarum is a severe form of nausea and vomiting that happens during pregnancy. Hyperemesis is worse than morning sickness. It may cause you to have nausea or vomiting all day for many days. It may keep you from eating and drinking enough food and liquids, which can lead to dehydration, malnutrition, and weight loss. Hyperemesis usually occurs during the first half (the first 20 weeks) of pregnancy. It often goes away once a woman is in her second half of pregnancy. However, sometimes hyperemesis continues through an entire pregnancy. What are the causes? The cause of this condition is not known. It may be related to changes in chemicals (hormones) in the body during pregnancy, such as the high level of pregnancy hormone (human chorionic gonadotropin) or the increase in the female sex hormone  (estrogen). What are the signs or symptoms? Symptoms of this condition include:  Nausea that does not go away.  Vomiting that does not allow you to keep any food down.  Weight loss.  Body fluid loss (dehydration).  Having no desire to eat, or not liking food that you have previously enjoyed. How is this diagnosed? This condition may be diagnosed based on:  A physical exam.  Your medical history.  Your symptoms.  Blood tests.  Urine tests. How is this treated? This condition is managed by controlling symptoms. This may include:  Following an eating plan. This can help lessen nausea and vomiting.  Taking prescription medicines. An eating plan and medicines are often used together to help control symptoms. If medicines do not help relieve nausea and vomiting, you may need to receive fluids through an IV at the hospital. Follow these instructions at home: Eating and drinking   Avoid the following: ? Drinking fluids with meals. Try not to drink anything during the 30 minutes before and after your meals. ? Drinking more than 1 cup of fluid at a time. ? Eating foods that trigger your symptoms. These may include spicy foods, coffee, high-fat foods, very sweet foods, and acidic foods. ? Skipping meals. Nausea can be more intense on an empty stomach. If you cannot tolerate food, do not force it.  Try sucking on ice chips or other frozen items and make up for missed calories later. ? Lying down within 2 hours after eating. ? Being exposed to environmental triggers. These may include food smells, smoky rooms, closed spaces, rooms with strong smells, warm or humid places, overly loud and noisy rooms, and rooms with motion or flickering lights. Try eating meals in a well-ventilated area that is free of strong smells. ? Quick and sudden changes in your movement. ? Taking iron pills and multivitamins that contain iron. If you take prescription iron pills, do not stop taking them unless  your health care provider approves. ? Preparing food. The smell of food can spoil your appetite or trigger nausea.  To help relieve your symptoms: ? Listen to your body. Everyone is different and has different preferences. Find what works best for you. ? Eat and drink slowly. ? Eat 5-6 small meals daily instead of 3 large meals. Eating small meals and snacks can help you avoid an empty stomach. ? In the morning, before getting out of bed, eat a couple of crackers to avoid moving around on an empty stomach. ? Try eating starchy foods as these are usually tolerated well. Examples include cereal, toast, bread, potatoes, pasta, rice, and pretzels. ? Include at least 1 serving of protein with your meals and snacks. Protein options include lean meats, poultry, seafood, beans, nuts, nut butters, eggs, cheese, and yogurt. ? Try eating a protein-rich snack before bed. Examples of a protein-rick snack include cheese and crackers or a peanut butter sandwich made with 1 slice of whole-wheat bread and 1 tsp (5 g) of peanut butter. ? Eat or suck on things that have ginger in them. It may help relieve nausea. Add  tsp ground ginger to hot tea or choose ginger tea. ? Try drinking 100% fruit juice or an electrolyte drink. An electrolyte drink contains sodium, potassium, and chloride. ? Drink fluids that are cold, clear, and carbonated or sour. Examples include lemonade, ginger ale, lemon-lime soda, ice water, and sparkling water. ? Brush your teeth or use a mouth rinse after meals. ? Talk with your health care provider about starting a supplement of vitamin B6. General instructions  Take over-the-counter and prescription medicines only as told by your health care provider.  Follow instructions from your health care provider about eating or drinking restrictions.  Continue to take your prenatal vitamins as told by your health care provider. If you are having trouble taking your prenatal vitamins, talk with your  health care provider about different options.  Keep all follow-up and pre-birth (prenatal) visits as told by your health care provider. This is important. Contact a health care provider if:  You have pain in your abdomen.  You have a severe headache.  You have vision problems.  You are losing weight.  You feel weak or dizzy. Get help right away if:  You cannot drink fluids without vomiting.  You vomit blood.  You have constant nausea and vomiting.  You are very weak.  You faint.  You have a fever and your symptoms suddenly get worse. Summary  Hyperemesis gravidarum is a severe form of nausea and vomiting that happens during pregnancy.  Making some changes to your eating habits may help relieve nausea and vomiting.  This condition may be managed with medicine.  If medicines do not help relieve nausea and vomiting, you may need to receive fluids through an IV at the hospital. This information is not intended to replace advice  given to you by your health care provider. Make sure you discuss any questions you have with your health care provider. Document Revised: 03/23/2017 Document Reviewed: 10/31/2015 Elsevier Patient Education  2020 [employer].

## 2019-06-20 NOTE — Progress Notes (Signed)
ROB/Dating- upset stomach/hemorrhoids?, little spotting/cramping last few days

## 2019-06-20 NOTE — Progress Notes (Signed)
Routine Prenatal Care Visit  Subjective  Anna Whitehead is a 34 y.o. G2P1001 at [redacted]w[redacted]d being seen today for ongoing prenatal care.  She is currently monitored for the following issues for this low-risk pregnancy and has Asthma, mild; H/O high risk medication treatment; Overweight; Anxiety and depression; History of gestational hypertension; Depression, major, recurrent, moderate ([place]); B12 deficiency; Vitamin D insufficiency; Supervision of other normal pregnancy, antepartum; and Hx of preeclampsia, prior pregnancy, currently pregnant on their problem list.  ----------------------------------------------------------------------------------- Patient reports nausea and constipation/diarrhea.  Small amount of vaginal spotting a few days ago, mow resolved. Contractions: Not present. Vag. Bleeding: None.  Movement: Absent. Denies leaking of fluid.  ----------------------------------------------------------------------------------- The following portions of the patient's history were reviewed and updated as appropriate: allergies, current medications, past family history, past medical history, past social history, past surgical history and problem list. Problem list updated.   Objective  Blood pressure 110/80, weight 237 lb (107.5 kg), last menstrual period 05/07/2019. Pregravid weight 240 lb (108.9 kg) Total Weight Gain -3 lb (-1.361 kg) Urinalysis:      Fetal Status: Fetal Heart Rate (bpm): present   Movement: Absent     General:  Alert, oriented and cooperative. Patient is in no acute distress.  Skin: Skin is warm and dry. No rash noted.   Cardiovascular: Normal heart rate noted  Respiratory: Normal respiratory effort, no problems with respiration noted  Abdomen: Soft, gravid, appropriate for gestational age. Pain/Pressure: Absent     Pelvic:  Cervical exam deferred        Extremities: Normal range of motion.     Mental Status: Normal mood and affect. Normal behavior. Normal judgment and  thought content.     Assessment   34 y.o. G2P1001 at [redacted]w[redacted]d by  02/11/2020, by Last Menstrual Period presenting for routine prenatal visit  Plan   Pregnancy#2 Problems (from 05/07/19 to present)    Problem Noted Resolved   Supervision of other normal pregnancy, antepartum 06/16/2019 by Malachy Mood, MD No   Overview Signed 06/20/2019  9:42 AM by Homero Fellers, MD     Nursing Staff Provider  Office Location  [facility] Dating   LMP = 6 wk [place]  Language  English Anatomy US    Flu Vaccine   Genetic Screen  NIPS:   AFP:   First Screen:    TDaP vaccine    Hgb A1C or  GTT Early : Third trimester :   Rhogam     LAB RESULTS   Feeding Plan  Blood Type     Contraception  Antibody    Circumcision  Rubella    Pediatrician   RPR     Support Person  HBsAg     Prenatal Classes  HIV      Varicella    BTL Consent  GBS  (For PCN allergy, check sensitivities)        VBAC Consent  Pap      Hgb Electro      CF      SMA                 Discussed management of nausea/vomiting and hemorrhoids with patient. Given information about materni t21 pricing NOB labs today Dating [place] today  Gestational age appropriate obstetric precautions including but not limited to vaginal bleeding, contractions, leaking of fluid and fetal movement were reviewed in detail with the patient.    Return in about 4 weeks (around 07/18/2019) for ROB in person.  [place]  MD [facility], [facility] 06/20/2019, 9:50 AM

## 2019-06-22 LAB — RPR+RH+ABO+RUB AB+AB SCR+CB...
Antibody Screen: NEGATIVE
HIV Screen 4th Generation wRfx: NONREACTIVE
Hematocrit: 34.1 % (ref 34.0–46.6)
Hemoglobin: 10.4 g/dL — ABNORMAL LOW (ref 11.1–15.9)
Hepatitis B Surface Ag: NEGATIVE
MCH: 23.8 pg — ABNORMAL LOW (ref 26.6–33.0)
MCHC: 30.5 g/dL — ABNORMAL LOW (ref 31.5–35.7)
MCV: 78 fL — ABNORMAL LOW (ref 79–97)
Platelets: 261 10*3/uL (ref 150–450)
RBC: 4.37 x10E6/uL (ref 3.77–5.28)
RDW: 15.3 % (ref 11.7–15.4)
RPR Ser Ql: NONREACTIVE
Rh Factor: NEGATIVE
Rubella Antibodies, IGG: 1.89 index (ref >0.99)
Varicella zoster IgG: 765 index (ref >165)
WBC: 5.7 10*3/uL (ref 3.4–10.8)

## 2019-06-22 LAB — COMPREHENSIVE METABOLIC PANEL
ALT: 10 IU/L (ref 0–32)
AST: 13 IU/L (ref 0–40)
Albumin/Globulin Ratio: 2 (ref 1.2–2.2)
Albumin: 4.5 g/dL (ref 3.8–4.8)
Alkaline Phosphatase: 54 IU/L (ref 39–117)
BUN/Creatinine Ratio: 10 (ref 9–23)
BUN: 7 mg/dL (ref 6–20)
Bilirubin Total: 0.2 mg/dL (ref 0.0–1.2)
CO2: 23 mmol/L (ref 20–29)
Calcium: 9.1 mg/dL (ref 8.7–10.2)
Chloride: 102 mmol/L (ref 96–106)
Creatinine, Ser: 0.71 mg/dL (ref 0.57–1.00)
GFR calc Af Amer: 131 mL/min/{1.73_m2} (ref >59)
GFR calc non Af Amer: 114 mL/min/{1.73_m2} (ref >59)
Globulin, Total: 2.3 g/dL (ref 1.5–4.5)
Glucose: 88 mg/dL (ref 65–99)
Potassium: 4.2 mmol/L (ref 3.5–5.2)
Sodium: 138 mmol/L (ref 134–144)
Total Protein: 6.8 g/dL (ref 6.0–8.5)

## 2019-07-08 ENCOUNTER — Other Ambulatory Visit: Payer: Self-pay

## 2019-07-08 ENCOUNTER — Ambulatory Visit (INDEPENDENT_AMBULATORY_CARE_PROVIDER_SITE_OTHER): Payer: BC Managed Care – PPO | Admitting: Certified Nurse Midwife

## 2019-07-08 ENCOUNTER — Telehealth: Payer: Self-pay

## 2019-07-08 VITALS — BP 116/74 | Wt 236.0 lb

## 2019-07-08 DIAGNOSIS — Z6791 Unspecified blood type, Rh negative: Secondary | ICD-10-CM

## 2019-07-08 DIAGNOSIS — O219 Vomiting of pregnancy, unspecified: Secondary | ICD-10-CM

## 2019-07-08 DIAGNOSIS — Z3480 Encounter for supervision of other normal pregnancy, unspecified trimester: Secondary | ICD-10-CM

## 2019-07-08 DIAGNOSIS — O209 Hemorrhage in early pregnancy, unspecified: Secondary | ICD-10-CM

## 2019-07-08 DIAGNOSIS — Z3A08 8 weeks gestation of pregnancy: Secondary | ICD-10-CM

## 2019-07-08 DIAGNOSIS — O26899 Other specified pregnancy related conditions, unspecified trimester: Secondary | ICD-10-CM

## 2019-07-08 LAB — POCT URINALYSIS DIPSTICK OB
Glucose, UA: NEGATIVE
POC,PROTEIN,UA: NEGATIVE

## 2019-07-08 MED ORDER — RHO D IMMUNE GLOBULIN 1500 UNIT/2ML IJ SOSY
300.0000 ug | PREFILLED_SYRINGE | Freq: Once | INTRAMUSCULAR | Status: AC
  Administered 2019-07-08: 300 ug via INTRAMUSCULAR

## 2019-07-08 MED ORDER — PROMETHAZINE HCL 12.5 MG PO TABS: 12.5000 mg | ORAL_TABLET | Freq: Four times a day (QID) | ORAL | 1 refills | Status: DC | PRN

## 2019-07-08 MED ORDER — DOXYLAMINE-PYRIDOXINE 10-10 MG PO TBEC: 2.0000 | DELAYED_RELEASE_TABLET | Freq: Every day | ORAL | 5 refills | Status: DC

## 2019-07-08 NOTE — Telephone Encounter (Signed)
Spoke w/patient. She has had ongoing spotting (brown & only notices when she wipes) for several weeks now. Her upset stomach has been for past several weeks as well. She had only had nausea until a few days ago and has now started with vomiting. The only times she has tried the B6 etc, it didn't stay down. She has lost another 5-6 lbs since her visit. She reports losing a lot of weight in the beginning of her previous pregnancy. Advised spotting normal in early pregnancy & advised of dehydration protocol/when to report to ED if not keeping anything down/in for full 24 hours. Patient ok with monitoring spotting. She did inquire if she is scheduled for u/s at next visit. Advised she is not. Message to CLG for review of need for u/s. Pt requesting for peace of mind. Advised pt CLG is able to u/s herself if needed.

## 2019-07-08 NOTE — Progress Notes (Signed)
Work in [employer]. Pt having some NVD, and some vaginal spotting.

## 2019-07-08 NOTE — Telephone Encounter (Signed)
Unable to reach patient.

## 2019-07-08 NOTE — Telephone Encounter (Signed)
I tried calling this patient and left a message to call me back. Would like to see her today and give her a dose of Rhogam. Can do a ultrasound also.

## 2019-07-08 NOTE — Telephone Encounter (Signed)
Patient reports she has had some spotting and upset stomach. Uncertain if cramps or just her stomach hurting. Inquiring if needs to be seen. BU#[phone]

## 2019-07-09 NOTE — Progress Notes (Signed)
Work in at 18wk25d: 35 year old G2 P1001 presents at [facility] with complaints of spotting and cramping intermittently x 2-3 weeks. The bleeding has ranged from BRB to brownish in color. The bleeding has been more brownish in color recently. Blood type is O negative. She also reports more nausea and vomiting this week. Has tried Unisom with vitamin B6. Has been able to keep down Gatorade, crackers, water, and sometimes a meal. Lost 4# since starting her care 3 weeks ago.  Has had postprandial loose stools. No blood in stools, but her hemorrhoids have been flaring with the loose stools.   Exam: General: WF in NAD BP 116/74 Abdomen: soft, NT, no masses Pelvic: External/BUS: no lesions, inflammation or blood seen Vagina: off white mucoid discharge Cervix: closed/ posterior/ not friable  Informal ultrasound: SIUP with CRL 8wk4d with normal FCA and +FM seen. No SCH seen  A: Viable IUP at 8wk6d Spotting/ bleeding in early pregnancy and O negative blood type-Rhogam indicated Nausea and vomiting of pregnancy  P: Rhogam given today RX Phenergan 12.5 mgm tablets -one every 6 hours prn nausea Diclegis: 2 at hs and can increase to adding one in AM and one in afternoon if needed  ROB as scheduled or sooner prn worsening bleeding or inability to keep down food and fluids.  Farrel Conners, CNM

## 2019-07-12 ENCOUNTER — Encounter: Payer: Self-pay | Admitting: Obstetrics and Gynecology

## 2019-07-18 ENCOUNTER — Other Ambulatory Visit: Payer: Self-pay

## 2019-07-18 ENCOUNTER — Ambulatory Visit (INDEPENDENT_AMBULATORY_CARE_PROVIDER_SITE_OTHER): Payer: BC Managed Care – PPO | Admitting: Certified Nurse Midwife

## 2019-07-18 ENCOUNTER — Encounter: Payer: Self-pay | Admitting: Certified Nurse Midwife

## 2019-07-18 VITALS — BP 122/74 | Wt 234.0 lb

## 2019-07-18 DIAGNOSIS — Z800 Family history of malignant neoplasm of digestive organs: Secondary | ICD-10-CM | POA: Insufficient documentation

## 2019-07-18 DIAGNOSIS — O99211 Obesity complicating pregnancy, first trimester: Secondary | ICD-10-CM

## 2019-07-18 DIAGNOSIS — O99210 Obesity complicating pregnancy, unspecified trimester: Secondary | ICD-10-CM

## 2019-07-18 DIAGNOSIS — Z3480 Encounter for supervision of other normal pregnancy, unspecified trimester: Secondary | ICD-10-CM

## 2019-07-18 DIAGNOSIS — Z803 Family history of malignant neoplasm of breast: Secondary | ICD-10-CM | POA: Insufficient documentation

## 2019-07-18 DIAGNOSIS — Z3A10 10 weeks gestation of pregnancy: Secondary | ICD-10-CM

## 2019-07-18 LAB — POCT URINALYSIS DIPSTICK OB
Glucose, UA: NEGATIVE
POC,PROTEIN,UA: NEGATIVE

## 2019-07-18 NOTE — Progress Notes (Signed)
No vb. No lof. Nausea has gotten a little better with the BS and unisom.

## 2019-07-18 NOTE — Progress Notes (Signed)
Anna Whitehead at 10wk2d: Bleeding and spotting has stopped. Continues to have postprandial loose/watery stools-usually 3x/day, but nausea and vomiting have decreased with vitamin B6 and Unisom. Has lost 2 more pounds in the last 2 weeks. Received a letter from [facility] saying she qualifies for genetic testing. Has one maternal aunt with pancreatic cancer and two with breast cancer (one died in her 75s). She does desires MYRISK testing. She is also thinking of getting MAterniT21 test done (self pay).  EXam: Fh palpable 2-3 FB above the SP. FHTs heard with DT at 165BPM  A: IUP at 10wk2d with +FHTs with DT Family history of pancreatic and breast cancer BMI 33.58 kg/m2 Nausea and vomiting improving  P: Anna Whitehead in 2 weeks Can get 1 hour GTT, Myrisk and MaterniT 21 testing also if patient desires  Farrel Conners, CNM

## 2019-08-01 ENCOUNTER — Encounter: Payer: Self-pay | Admitting: Obstetrics

## 2019-08-01 ENCOUNTER — Other Ambulatory Visit: Payer: Self-pay

## 2019-08-01 ENCOUNTER — Other Ambulatory Visit: Payer: BC Managed Care – PPO

## 2019-08-01 ENCOUNTER — Ambulatory Visit (INDEPENDENT_AMBULATORY_CARE_PROVIDER_SITE_OTHER): Payer: BC Managed Care – PPO | Admitting: Obstetrics

## 2019-08-01 VITALS — BP 116/88 | Wt 232.0 lb

## 2019-08-01 DIAGNOSIS — E669 Obesity, unspecified: Secondary | ICD-10-CM

## 2019-08-01 DIAGNOSIS — Z3A12 12 weeks gestation of pregnancy: Secondary | ICD-10-CM

## 2019-08-01 DIAGNOSIS — Z3480 Encounter for supervision of other normal pregnancy, unspecified trimester: Secondary | ICD-10-CM

## 2019-08-01 DIAGNOSIS — O99210 Obesity complicating pregnancy, unspecified trimester: Secondary | ICD-10-CM

## 2019-08-01 DIAGNOSIS — R1030 Lower abdominal pain, unspecified: Secondary | ICD-10-CM

## 2019-08-01 DIAGNOSIS — Z803 Family history of malignant neoplasm of breast: Secondary | ICD-10-CM

## 2019-08-01 NOTE — Progress Notes (Signed)
ROB Early Gtt and Mat21 today

## 2019-08-01 NOTE — Patient Instructions (Signed)
I value your feedback and entrusting [place] with your care. If you get a [facility] patient survey, I would appreciate you taking the time to let us know about your experience today. Thank you!  As of February 24, 2019, your lab results will be released to your MyChart immediately, before I even have a chance to see them. Please give me time to review them and contact you if there are any abnormalities. Thank you for your patience.  Second Trimester of Pregnancy The second trimester is from week 14 through week 27 (months 4 through 6). The second trimester is often a time when you feel your best. Your body has adjusted to being pregnant, and you begin to feel better physically. Usually, morning sickness has lessened or quit completely, you may have more energy, and you may have an increase in appetite. The second trimester is also a time when the fetus is growing rapidly. At the end of the sixth month, the fetus is about 9 inches long and weighs about 1 pounds. You will likely begin to feel the baby move (quickening) between 16 and 20 weeks of pregnancy. Body changes during your second trimester Your body continues to go through many changes during your second trimester. The changes vary from woman to woman.  Your weight will continue to increase. You will notice your lower abdomen bulging out.  You may begin to get stretch marks on your hips, abdomen, and breasts.  You may develop headaches that can be relieved by medicines. The medicines should be approved by your health care provider.  You may urinate more often because the fetus is pressing on your bladder.  You may develop or continue to have heartburn as a result of your pregnancy.  You may develop constipation because certain hormones are causing the muscles that push waste through your intestines to slow down.  You may develop hemorrhoids or swollen, bulging veins (varicose veins).  You may have back pain. This is caused by: ? Weight  gain. ? Pregnancy hormones that are relaxing the joints in your pelvis. ? A shift in weight and the muscles that support your balance.  Your breasts will continue to grow and they will continue to become tender.  Your gums may bleed and may be sensitive to brushing and flossing.  Dark spots or blotches (chloasma, mask of pregnancy) may develop on your face. This will likely fade after the baby is born.  A dark line from your belly button to the pubic area (linea nigra) may appear. This will likely fade after the baby is born.  You may have changes in your hair. These can include thickening of your hair, rapid growth, and changes in texture. Some women also have hair loss during or after pregnancy, or hair that feels dry or thin. Your hair will most likely return to normal after your baby is born. What to expect at prenatal visits During a routine prenatal visit:  You will be weighed to make sure you and the fetus are growing normally.  Your blood pressure will be taken.  Your abdomen will be measured to track your baby's growth.  The fetal heartbeat will be listened to.  Any test results from the previous visit will be discussed. Your health care provider may ask you:  How you are feeling.  If you are feeling the baby move.  If you have had any abnormal symptoms, such as leaking fluid, bleeding, severe headaches, or abdominal cramping.  If you are using  any tobacco products, including cigarettes, chewing tobacco, and electronic cigarettes.  If you have any questions. Other tests that may be performed during your second trimester include:  Blood tests that check for: ? Low iron levels (anemia). ? High blood sugar that affects pregnant women (gestational diabetes) between 27 and 28 weeks. ? Rh antibodies. This is to check for a protein on red blood cells (Rh factor).  Urine tests to check for infections, diabetes, or protein in the urine.  An ultrasound to confirm the  proper growth and development of the baby.  An amniocentesis to check for possible genetic problems.  Fetal screens for spina bifida and Down syndrome.  HIV (human immunodeficiency virus) testing. Routine prenatal testing includes screening for HIV, unless you choose not to have this test. Follow these instructions at home: Medicines  Follow your health care provider's instructions regarding medicine use. Specific medicines may be either safe or unsafe to take during pregnancy.  Take a prenatal vitamin that contains at least 600 micrograms (mcg) of folic acid.  If you develop constipation, try taking a stool softener if your health care provider approves. Eating and drinking   Eat a balanced diet that includes fresh fruits and vegetables, whole grains, good sources of protein such as meat, eggs, or tofu, and low-fat dairy. Your health care provider will help you determine the amount of weight gain that is right for you.  Avoid raw meat and uncooked cheese. These carry germs that can cause birth defects in the baby.  If you have low calcium intake from food, talk to your health care provider about whether you should take a daily calcium supplement.  Limit foods that are high in fat and processed sugars, such as fried and sweet foods.  To prevent constipation: ? Drink enough fluid to keep your urine clear or pale yellow. ? Eat foods that are high in fiber, such as fresh fruits and vegetables, whole grains, and beans. Activity  Exercise only as directed by your health care provider. Most women can continue their usual exercise routine during pregnancy. Try to exercise for 30 minutes at least 5 days a week. Stop exercising if you experience uterine contractions.  Avoid heavy lifting, wear low heel shoes, and practice good posture.  A sexual relationship may be continued unless your health care provider directs you otherwise. Relieving pain and discomfort  Wear a good support bra to  prevent discomfort from breast tenderness.  Take warm sitz baths to soothe any pain or discomfort caused by hemorrhoids. Use hemorrhoid cream if your health care provider approves.  Rest with your legs elevated if you have leg cramps or low back pain.  If you develop varicose veins, wear support hose. Elevate your feet for 15 minutes, 3-4 times a day. Limit salt in your diet. Prenatal Care  Write down your questions. Take them to your prenatal visits.  Keep all your prenatal visits as told by your health care provider. This is important. Safety  Wear your seat belt at all times when driving.  Make a list of emergency phone numbers, including numbers for family, friends, the hospital, and police and fire departments. General instructions  Ask your health care provider for a referral to a local prenatal education class. Begin classes no later than the beginning of month 6 of your pregnancy.  Ask for help if you have counseling or nutritional needs during pregnancy. Your health care provider can offer advice or refer you to specialists for help with various  needs.  Do not use hot tubs, steam rooms, or saunas.  Do not douche or use tampons or scented sanitary pads.  Do not cross your legs for long periods of time.  Avoid cat litter boxes and soil used by cats. These carry germs that can cause birth defects in the baby and possibly loss of the fetus by miscarriage or stillbirth.  Avoid all smoking, herbs, alcohol, and unprescribed drugs. Chemicals in these products can affect the formation and growth of the baby.  Do not use any products that contain nicotine or tobacco, such as cigarettes and e-cigarettes. If you need help quitting, ask your health care provider.  Visit your dentist if you have not gone yet during your pregnancy. Use a soft toothbrush to brush your teeth and be gentle when you floss. Contact a health care provider if:  You have dizziness.  You have mild pelvic  cramps, pelvic pressure, or nagging pain in the abdominal area.  You have persistent nausea, vomiting, or diarrhea.  You have a bad smelling vaginal discharge.  You have pain when you urinate. Get help right away if:  You have a fever.  You are leaking fluid from your vagina.  You have spotting or bleeding from your vagina.  You have severe abdominal cramping or pain.  You have rapid weight gain or weight loss.  You have shortness of breath with chest pain.  You notice sudden or extreme swelling of your face, hands, ankles, feet, or legs.  You have not felt your baby move in over an hour.  You have severe headaches that do not go away when you take medicine.  You have vision changes. Summary  The second trimester is from week 14 through week 27 (months 4 through 6). It is also a time when the fetus is growing rapidly.  Your body goes through many changes during pregnancy. The changes vary from woman to woman.  Avoid all smoking, herbs, alcohol, and unprescribed drugs. These chemicals affect the formation and growth your baby.  Do not use any tobacco products, such as cigarettes, chewing tobacco, and e-cigarettes. If you need help quitting, ask your health care provider.  Contact your health care provider if you have any questions. Keep all prenatal visits as told by your health care provider. This is important. This information is not intended to replace advice given to you by your health care provider. Make sure you discuss any questions you have with your health care provider. Document Revised: 06/25/2018 Document Reviewed: 04/08/2016 Elsevier Patient Education  2020 [employer].  First Trimester of Pregnancy The first trimester of pregnancy is from week 1 until the end of week 13 (months 1 through 3). A week after a sperm fertilizes an egg, the egg will implant on the wall of the uterus. This embryo will begin to develop into a baby. Genes from you and your partner  will form the baby. The female genes will determine whether the baby will be a boy or a girl. At 6-8 weeks, the eyes and face will be formed, and the heartbeat can be seen on ultrasound. At the end of 12 weeks, all the baby's organs will be formed. Now that you are pregnant, you will want to do everything you can to have a healthy baby. Two of the most important things are to get good prenatal care and to follow your health care provider's instructions. Prenatal care is all the medical care you receive before the baby's birth. This care  will help prevent, find, and treat any problems during the pregnancy and childbirth. Body changes during your first trimester Your body goes through many changes during pregnancy. The changes vary from woman to woman.  You may gain or lose a couple of pounds at first.  You may feel sick to your stomach (nauseous) and you may throw up (vomit). If the vomiting is uncontrollable, call your health care provider.  You may tire easily.  You may develop headaches that can be relieved by medicines. All medicines should be approved by your health care provider.  You may urinate more often. Painful urination may mean you have a bladder infection.  You may develop heartburn as a result of your pregnancy.  You may develop constipation because certain hormones are causing the muscles that push stool through your intestines to slow down.  You may develop hemorrhoids or swollen veins (varicose veins).  Your breasts may begin to grow larger and become tender. Your nipples may stick out more, and the tissue that surrounds them (areola) may become darker.  Your gums may bleed and may be sensitive to brushing and flossing.  Dark spots or blotches (chloasma, mask of pregnancy) may develop on your face. This will likely fade after the baby is born.  Your menstrual periods will stop.  You may have a loss of appetite.  You may develop cravings for certain kinds of food.  You  may have changes in your emotions from day to day, such as being excited to be pregnant or being concerned that something may go wrong with the pregnancy and baby.  You may have more vivid and strange dreams.  You may have changes in your hair. These can include thickening of your hair, rapid growth, and changes in texture. Some women also have hair loss during or after pregnancy, or hair that feels dry or thin. Your hair will most likely return to normal after your baby is born. What to expect at prenatal visits During a routine prenatal visit:  You will be weighed to make sure you and the baby are growing normally.  Your blood pressure will be taken.  Your abdomen will be measured to track your baby's growth.  The fetal heartbeat will be listened to between weeks 10 and 14 of your pregnancy.  Test results from any previous visits will be discussed. Your health care provider may ask you:  How you are feeling.  If you are feeling the baby move.  If you have had any abnormal symptoms, such as leaking fluid, bleeding, severe headaches, or abdominal cramping.  If you are using any tobacco products, including cigarettes, chewing tobacco, and electronic cigarettes.  If you have any questions. Other tests that may be performed during your first trimester include:  Blood tests to find your blood type and to check for the presence of any previous infections. The tests will also be used to check for low iron levels (anemia) and protein on red blood cells (Rh antibodies). Depending on your risk factors, or if you previously had diabetes during pregnancy, you may have tests to check for high blood sugar that affects pregnant women (gestational diabetes).  Urine tests to check for infections, diabetes, or protein in the urine.  An ultrasound to confirm the proper growth and development of the baby.  Fetal screens for spinal cord problems (spina bifida) and Down syndrome.  HIV (human  immunodeficiency virus) testing. Routine prenatal testing includes screening for HIV, unless you choose not to have  this test.  You may need other tests to make sure you and the baby are doing well. Follow these instructions at home: Medicines  Follow your health care provider's instructions regarding medicine use. Specific medicines may be either safe or unsafe to take during pregnancy.  Take a prenatal vitamin that contains at least 600 micrograms (mcg) of folic acid.  If you develop constipation, try taking a stool softener if your health care provider approves. Eating and drinking   Eat a balanced diet that includes fresh fruits and vegetables, whole grains, good sources of protein such as meat, eggs, or tofu, and low-fat dairy. Your health care provider will help you determine the amount of weight gain that is right for you.  Avoid raw meat and uncooked cheese. These carry germs that can cause birth defects in the baby.  Eating four or five small meals rather than three large meals a day may help relieve nausea and vomiting. If you start to feel nauseous, eating a few soda crackers can be helpful. Drinking liquids between meals, instead of during meals, also seems to help ease nausea and vomiting.  Limit foods that are high in fat and processed sugars, such as fried and sweet foods.  To prevent constipation: ? Eat foods that are high in fiber, such as fresh fruits and vegetables, whole grains, and beans. ? Drink enough fluid to keep your urine clear or pale yellow. Activity  Exercise only as directed by your health care provider. Most women can continue their usual exercise routine during pregnancy. Try to exercise for 30 minutes at least 5 days a week. Exercising will help you: ? Control your weight. ? Stay in shape. ? Be prepared for labor and delivery.  Experiencing pain or cramping in the lower abdomen or lower back is a good sign that you should stop exercising. Check with  your health care provider before continuing with normal exercises.  Try to avoid standing for long periods of time. Move your legs often if you must stand in one place for a long time.  Avoid heavy lifting.  Wear low-heeled shoes and practice good posture.  You may continue to have sex unless your health care provider tells you not to. Relieving pain and discomfort  Wear a good support bra to relieve breast tenderness.  Take warm sitz baths to soothe any pain or discomfort caused by hemorrhoids. Use hemorrhoid cream if your health care provider approves.  Rest with your legs elevated if you have leg cramps or low back pain.  If you develop varicose veins in your legs, wear support hose. Elevate your feet for 15 minutes, 3-4 times a day. Limit salt in your diet. Prenatal care  Schedule your prenatal visits by the twelfth week of pregnancy. They are usually scheduled monthly at first, then more often in the last 2 months before delivery.  Write down your questions. Take them to your prenatal visits.  Keep all your prenatal visits as told by your health care provider. This is important. Safety  Wear your seat belt at all times when driving.  Make a list of emergency phone numbers, including numbers for family, friends, the hospital, and police and fire departments. General instructions  Ask your health care provider for a referral to a local prenatal education class. Begin classes no later than the beginning of month 6 of your pregnancy.  Ask for help if you have counseling or nutritional needs during pregnancy. Your health care provider can offer advice or  refer you to specialists for help with various needs.  Do not use hot tubs, steam rooms, or saunas.  Do not douche or use tampons or scented sanitary pads.  Do not cross your legs for long periods of time.  Avoid cat litter boxes and soil used by cats. These carry germs that can cause birth defects in the baby and possibly  loss of the fetus by miscarriage or stillbirth.  Avoid all smoking, herbs, alcohol, and medicines not prescribed by your health care provider. Chemicals in these products affect the formation and growth of the baby.  Do not use any products that contain nicotine or tobacco, such as cigarettes and e-cigarettes. If you need help quitting, ask your health care provider. You may receive counseling support and other resources to help you quit.  Schedule a dentist appointment. At home, brush your teeth with a soft toothbrush and be gentle when you floss. Contact a health care provider if:  You have dizziness.  You have mild pelvic cramps, pelvic pressure, or nagging pain in the abdominal area.  You have persistent nausea, vomiting, or diarrhea.  You have a bad smelling vaginal discharge.  You have pain when you urinate.  You notice increased swelling in your face, hands, legs, or ankles.  You are exposed to fifth disease or chickenpox.  You are exposed to [place] measles (rubella) and have never had it. Get help right away if:  You have a fever.  You are leaking fluid from your vagina.  You have spotting or bleeding from your vagina.  You have severe abdominal cramping or pain.  You have rapid weight gain or loss.  You vomit blood or material that looks like coffee grounds.  You develop a severe headache.  You have shortness of breath.  You have any kind of trauma, such as from a fall or a car accident. Summary  The first trimester of pregnancy is from week 1 until the end of week 13 (months 1 through 3).  Your body goes through many changes during pregnancy. The changes vary from woman to woman.  You will have routine prenatal visits. During those visits, your health care provider will examine you, discuss any test results you may have, and talk with you about how you are feeling. This information is not intended to replace advice given to you by your health care provider.  Make sure you discuss any questions you have with your health care provider. Document Revised: 02/13/2017 Document Reviewed: 02/13/2016 Elsevier Patient Education  2020 [employer].

## 2019-08-01 NOTE — Progress Notes (Signed)
Routine Prenatal Care Visit  Subjective  Anna Whitehead is a 34 y.o. G2P1001 at [redacted]w[redacted]d being seen today for ongoing prenatal care.  She is currently monitored for the following issues for this high-risk pregnancy and has Asthma, mild; H/O high risk medication treatment; Overweight; Anxiety and depression; History of gestational hypertension; Depression, major, recurrent, moderate (HCC); B12 deficiency; Vitamin D insufficiency; Supervision of other normal pregnancy, antepartum; Hx of preeclampsia, prior pregnancy, currently pregnant; Family history of breast cancer; and Family history of pancreatic cancer on their problem list.  ----------------------------------------------------------------------------------- Patient reports ongoing occasional nausea.    .  .   Pincus Large Fluid denies. She continues to use the B6 and Unisom for nausea. ----------------------------------------------------------------------------------- The following portions of the patient's history were reviewed and updated as appropriate: allergies, current medications, past family history, past medical history, past social history, past surgical history and problem list. Problem list updated.  Objective  Last menstrual period 05/07/2019. Pregravid weight 240 lb (108.9 kg) Total Weight Gain -6 lb (-2.722 kg) Urinalysis: Urine Protein    Urine Glucose    Fetal Status:           General:  Alert, oriented and cooperative. Patient is in no acute distress.  Skin: Skin is warm and dry. No rash noted.   Cardiovascular: Normal heart rate noted  Respiratory: Normal respiratory effort, no problems with respiration noted  Abdomen: Soft, gravid, appropriate for gestational age.       Pelvic:  Cervical exam deferred        Extremities: Normal range of motion.     Mental Status: Normal mood and affect. Normal behavior. Normal judgment and thought content.   Assessment   34 y.o. G2P1001 at [redacted]w[redacted]d by  02/11/2020, by Last Menstrual  Period presenting for routine prenatal visit and early 1hr GTT as well as MaternT testing.  Plan   Pregnancy#2 Problems (from 05/07/19 to present)    Problem Noted Resolved   Supervision of other normal pregnancy, antepartum 06/16/2019 by Vena Austria, MD No   Overview Addendum 08/01/2019 10:16 AM by Mirna Mires, CNM     Nursing Staff Provider  Office Location  [facility] Dating   LMP = 6 wk [place]  Language  English Anatomy US    Flu Vaccine   Genetic Screen  NIPS:   AFP:   First Screen:  declines  TDaP vaccine    Hgb A1C or  GTT Early : Third trimester :   Rhogam     LAB RESULTS   Feeding Plan  Blood Type O/Negative/-- (04/05 1019)   Contraception  Antibody Negative (04/05 1019)  Circumcision  Rubella 1.89 (04/05 1019)  Pediatrician   RPR Non Reactive (04/05 1019)   Support Person Para March FOB HBsAg Negative (04/05 1019)   Prenatal Classes  HIV Non Reactive (04/05 1019)    Varicella  Immune  BTL Consent  GBS  (For PCN allergy, check sensitivities)        VBAC Consent  Pap      Hgb Electro      CF      SMA                   Preterm labor symptoms and general obstetric precautions including but not limited to vaginal bleeding, contractions, leaking of fluid and fetal movement were reviewed in detail with the patient. Please refer to After Visit Summary for other counseling recommendations.  Discussed the anatomy ultrasound at [redacted] weeks gestation today.  Myrisk testing  is drawn today with the early 1hrGTT. RTC in 4 weeks for ROB. She has opted out of fetal screening.   Imagene Riches, CNM  08/01/2019 2:33 PM

## 2019-08-01 NOTE — Assessment & Plan Note (Signed)
Plans on getting BRAC analysis (MyTest)

## 2019-08-02 LAB — GLUCOSE, 1 HOUR GESTATIONAL: Gestational Diabetes Screen: 135 mg/dL (ref 65–139)

## 2019-08-08 ENCOUNTER — Telehealth: Payer: Self-pay

## 2019-08-08 DIAGNOSIS — O219 Vomiting of pregnancy, unspecified: Secondary | ICD-10-CM

## 2019-08-08 DIAGNOSIS — Z3A13 13 weeks gestation of pregnancy: Secondary | ICD-10-CM

## 2019-08-08 NOTE — Telephone Encounter (Signed)
Pt calling triage c/o bad nausea and vomitting. unisom and B6 not helping. Can you please send in something else for pt? MF not here, was told to see if on call provider can send something in

## 2019-08-10 MED ORDER — PROMETHAZINE HCL 25 MG PO TABS: 25.0000 mg | ORAL_TABLET | Freq: Four times a day (QID) | ORAL | 1 refills | Status: DC | PRN

## 2019-08-10 NOTE — Telephone Encounter (Signed)
Called pt to update family history for myriad testing. Pt is asking if previous msg was routed to a provider. Still wanting something called in if possible. Please advise.

## 2019-08-29 ENCOUNTER — Ambulatory Visit (INDEPENDENT_AMBULATORY_CARE_PROVIDER_SITE_OTHER): Payer: BC Managed Care – PPO | Admitting: Obstetrics and Gynecology

## 2019-08-29 ENCOUNTER — Other Ambulatory Visit: Payer: Self-pay

## 2019-08-29 ENCOUNTER — Encounter: Payer: Self-pay | Admitting: Obstetrics and Gynecology

## 2019-08-29 VITALS — BP 100/60 | Wt 232.0 lb

## 2019-08-29 DIAGNOSIS — E669 Obesity, unspecified: Secondary | ICD-10-CM | POA: Insufficient documentation

## 2019-08-29 DIAGNOSIS — O09299 Supervision of pregnancy with other poor reproductive or obstetric history, unspecified trimester: Secondary | ICD-10-CM

## 2019-08-29 DIAGNOSIS — O09292 Supervision of pregnancy with other poor reproductive or obstetric history, second trimester: Secondary | ICD-10-CM

## 2019-08-29 DIAGNOSIS — O26892 Other specified pregnancy related conditions, second trimester: Secondary | ICD-10-CM

## 2019-08-29 DIAGNOSIS — O99212 Obesity complicating pregnancy, second trimester: Secondary | ICD-10-CM

## 2019-08-29 DIAGNOSIS — O0990 Supervision of high risk pregnancy, unspecified, unspecified trimester: Secondary | ICD-10-CM

## 2019-08-29 DIAGNOSIS — Z6791 Unspecified blood type, Rh negative: Secondary | ICD-10-CM

## 2019-08-29 DIAGNOSIS — Z3A16 16 weeks gestation of pregnancy: Secondary | ICD-10-CM

## 2019-08-29 DIAGNOSIS — O0992 Supervision of high risk pregnancy, unspecified, second trimester: Secondary | ICD-10-CM

## 2019-08-29 DIAGNOSIS — O26899 Other specified pregnancy related conditions, unspecified trimester: Secondary | ICD-10-CM | POA: Insufficient documentation

## 2019-08-29 DIAGNOSIS — O99210 Obesity complicating pregnancy, unspecified trimester: Secondary | ICD-10-CM | POA: Insufficient documentation

## 2019-08-29 LAB — POCT URINALYSIS DIPSTICK OB
Glucose, UA: NEGATIVE
POC,PROTEIN,UA: NEGATIVE

## 2019-08-29 NOTE — Progress Notes (Signed)
Routine Prenatal Care Visit  Subjective  Anna Whitehead is a 34 y.o. G2P1001 at [redacted]w[redacted]d being seen today for ongoing prenatal care.  She is currently monitored for the following issues for this high-risk pregnancy and has Asthma, mild; H/O high risk medication treatment; Overweight; Anxiety and depression; History of gestational hypertension; Depression, major, recurrent, moderate (HCC); B12 deficiency; Vitamin D insufficiency; Supervision of high risk pregnancy, antepartum; Hx of preeclampsia, prior pregnancy, currently pregnant; Family history of breast cancer; Family history of pancreatic cancer; Obesity affecting pregnancy; and Rh negative state in antepartum period on their problem list.  ----------------------------------------------------------------------------------- Patient reports no complaints.   Contractions: Not present. Vag. Bleeding: None.  Movement: Absent. Leaking Fluid denies.  ----------------------------------------------------------------------------------- The following portions of the patient's history were reviewed and updated as appropriate: allergies, current medications, past family history, past medical history, past social history, past surgical history and problem list. Problem list updated.  Objective  Blood pressure 100/60, weight 232 lb (105.2 kg), last menstrual period 05/07/2019. Pregravid weight 240 lb (108.9 kg) Total Weight Gain -8 lb (-3.629 kg) Urinalysis: Urine Protein Negative  Urine Glucose Negative  Fetal Status: Fetal Heart Rate (bpm): 140   Movement: Absent     General:  Alert, oriented and cooperative. Patient is in no acute distress.  Skin: Skin is warm and dry. No rash noted.   Cardiovascular: Normal heart rate noted  Respiratory: Normal respiratory effort, no problems with respiration noted  Abdomen: Soft, gravid, appropriate for gestational age. Pain/Pressure: Absent     Pelvic:  Cervical exam deferred        Extremities: Normal range of  motion.     Mental Status: Normal mood and affect. Normal behavior. Normal judgment and thought content.   Assessment   34 y.o. G2P1001 at [redacted]w[redacted]d by  02/11/2020, by Last Menstrual Period presenting for routine prenatal visit  Plan   Pregnancy#2 Problems (from 05/07/19 to present)    Problem Noted Resolved   Obesity affecting pregnancy 08/29/2019 by Conard Novak, MD No   Rh negative state in antepartum period 08/29/2019 by Conard Novak, MD No   Supervision of high risk pregnancy, antepartum 06/16/2019 by Vena Austria, MD No   Overview Addendum 08/02/2019  2:34 PM by Farrel Conners, CNM     Nursing Staff Provider  Office Location  [facility] Dating   LMP = 6 wk [place]  Language  English Anatomy US    Flu Vaccine   Genetic Screen  NIPS:   AFP:   First Screen:    TDaP vaccine    Early GTT135 Early : Third trimester :   Rhogam     LAB RESULTS   Feeding Plan  Blood Type O/Negative/-- (04/05 1019)   Contraception  Antibody Negative (04/05 1019)  Circumcision  Rubella 1.89 (04/05 1019)  Pediatrician   RPR Non Reactive (04/05 1019)   Support Person Para March FOB HBsAg Negative (04/05 1019)   Prenatal Classes  HIV Non Reactive (04/05 1019)    Varicella  Immune  BTL Consent  GBS  (For PCN allergy, check sensitivities)        VBAC Consent  Pap      Hgb Electro      CF      SMA               Previous Version       Preterm labor symptoms and general obstetric precautions including but not limited to vaginal bleeding, contractions, leaking of fluid and fetal movement were  reviewed in detail with the patient. Please refer to After Visit Summary for other counseling recommendations.   Return in about 2 weeks (around 09/12/2019) for 2-3 weeks anatomy u/s and routine prenatal.  Prentice Docker, MD, [place], [facility] 08/29/2019 4:43 PM

## 2019-09-13 ENCOUNTER — Ambulatory Visit (INDEPENDENT_AMBULATORY_CARE_PROVIDER_SITE_OTHER): Payer: BC Managed Care – PPO | Admitting: Certified Nurse Midwife

## 2019-09-13 ENCOUNTER — Other Ambulatory Visit: Payer: Self-pay

## 2019-09-13 VITALS — BP 120/84 | Wt 230.0 lb

## 2019-09-13 DIAGNOSIS — R195 Other fecal abnormalities: Secondary | ICD-10-CM | POA: Diagnosis not present

## 2019-09-13 DIAGNOSIS — Z3A18 18 weeks gestation of pregnancy: Secondary | ICD-10-CM

## 2019-09-13 DIAGNOSIS — O162 Unspecified maternal hypertension, second trimester: Secondary | ICD-10-CM

## 2019-09-13 DIAGNOSIS — H9311 Tinnitus, right ear: Secondary | ICD-10-CM

## 2019-09-13 LAB — POCT URINALYSIS DIPSTICK OB
Glucose, UA: NEGATIVE
POC,PROTEIN,UA: NEGATIVE

## 2019-09-13 NOTE — Progress Notes (Signed)
Headaches/swishing noise in ear

## 2019-09-14 LAB — CMP14+EGFR
ALT: 16 IU/L (ref 0–32)
AST: 18 IU/L (ref 0–40)
Albumin/Globulin Ratio: 1.5 (ref 1.2–2.2)
Albumin: 3.9 g/dL (ref 3.8–4.8)
Alkaline Phosphatase: 69 IU/L (ref 48–121)
BUN/Creatinine Ratio: 10 (ref 9–23)
BUN: 7 mg/dL (ref 6–20)
Bilirubin Total: <0.2 mg/dL (ref 0.0–1.2)
CO2: 22 mmol/L (ref 20–29)
Calcium: 8.8 mg/dL (ref 8.7–10.2)
Chloride: 104 mmol/L (ref 96–106)
Creatinine, Ser: 0.72 mg/dL (ref 0.57–1.00)
GFR calc Af Amer: 129 mL/min/{1.73_m2} (ref >59)
GFR calc non Af Amer: 112 mL/min/{1.73_m2} (ref >59)
Globulin, Total: 2.6 g/dL (ref 1.5–4.5)
Glucose: 94 mg/dL (ref 65–99)
Potassium: 4.2 mmol/L (ref 3.5–5.2)
Sodium: 139 mmol/L (ref 134–144)
Total Protein: 6.5 g/dL (ref 6.0–8.5)

## 2019-09-14 LAB — CBC WITH DIFFERENTIAL/PLATELET
Basophils Absolute: 0 10*3/uL (ref 0.0–0.2)
Basos: 0 %
EOS (ABSOLUTE): 0.4 10*3/uL (ref 0.0–0.4)
Eos: 5 %
Hematocrit: 32.2 % — ABNORMAL LOW (ref 34.0–46.6)
Hemoglobin: 9.6 g/dL — ABNORMAL LOW (ref 11.1–15.9)
Immature Grans (Abs): 0 10*3/uL (ref 0.0–0.1)
Immature Granulocytes: 0 %
Lymphocytes Absolute: 1.5 10*3/uL (ref 0.7–3.1)
Lymphs: 20 %
MCH: 23.5 pg — ABNORMAL LOW (ref 26.6–33.0)
MCHC: 29.8 g/dL — ABNORMAL LOW (ref 31.5–35.7)
MCV: 79 fL (ref 79–97)
Monocytes Absolute: 0.4 10*3/uL (ref 0.1–0.9)
Monocytes: 6 %
Neutrophils Absolute: 5.3 10*3/uL (ref 1.4–7.0)
Neutrophils: 69 %
Platelets: 262 10*3/uL (ref 150–450)
RBC: 4.08 x10E6/uL (ref 3.77–5.28)
RDW: 15.8 % — ABNORMAL HIGH (ref 11.7–15.4)
WBC: 7.7 10*3/uL (ref 3.4–10.8)

## 2019-09-14 NOTE — Progress Notes (Signed)
Work in at [employer]: Concerned about hearing her pulse in her right ear for the past 2 weeks. Will hear the "whoosing" no matter what position she is in . There is no ringing associated with the pulse. Over the weekend she had a frontal headache, and developed LLQ pain and green diarrhea. Her headache persisted from Friday until Sunday.  She has had diarrhea after each meal (3x/day) for the past 2-3 days. Has had intermittent nausea and vomiting with the pregnancy, but it worsened on Saturday and Sunday.  She was seen at Urgent Care on Saturday when she started having the sharp intermittent LLQ pains. Her LLQ pain has also resolved. Denies sore throat, PND, sinus congestion, fever. Has allergies. Current medication includes Pepcid, vitamin B6 and Unisom. She also reports that her husband had an upset stomach over the weekend. Hx of preeclampsia with first pregnancy. Medical hx also remarkable for anxiety/depression, mild asthma and obesity   Exam: BP 140/70, then repeat 120/84 General: gravid WF in NAD Ears: R TM is dull, not inflamed, ?a little retracted L TM: good cone reflex, no inflammation Throat: no inflammation or exudates Heart: RRR without murmur  Abdomen: Soft, NT, FHTs WNL  Hemoglobin 10.4 gm/dl two months ago  A: IUP at 18wk3d Whooshing in right ear possibly due to anemia/ Eustachian tube dysfunction Mild range blood pressure on presentation Loose green stools  P: CBC, CMP Discussed possible ETD Stop Pepcid in case this medication is causing increased motility, R/O AGE Follow up as scheduled. Will call with results of labs and to follow up  Farrel Conners, CNM

## 2019-09-21 ENCOUNTER — Other Ambulatory Visit: Payer: Self-pay

## 2019-09-21 ENCOUNTER — Encounter: Payer: Self-pay | Admitting: Obstetrics & Gynecology

## 2019-09-21 ENCOUNTER — Ambulatory Visit (INDEPENDENT_AMBULATORY_CARE_PROVIDER_SITE_OTHER): Payer: BC Managed Care – PPO | Admitting: Obstetrics & Gynecology

## 2019-09-21 ENCOUNTER — Ambulatory Visit (INDEPENDENT_AMBULATORY_CARE_PROVIDER_SITE_OTHER): Payer: BC Managed Care – PPO

## 2019-09-21 VITALS — BP 110/70 | Wt 228.0 lb

## 2019-09-21 DIAGNOSIS — O99212 Obesity complicating pregnancy, second trimester: Secondary | ICD-10-CM

## 2019-09-21 DIAGNOSIS — O0990 Supervision of high risk pregnancy, unspecified, unspecified trimester: Secondary | ICD-10-CM

## 2019-09-21 DIAGNOSIS — Z3A19 19 weeks gestation of pregnancy: Secondary | ICD-10-CM

## 2019-09-21 DIAGNOSIS — O0992 Supervision of high risk pregnancy, unspecified, second trimester: Secondary | ICD-10-CM

## 2019-09-21 DIAGNOSIS — O99019 Anemia complicating pregnancy, unspecified trimester: Secondary | ICD-10-CM

## 2019-09-21 DIAGNOSIS — O09299 Supervision of pregnancy with other poor reproductive or obstetric history, unspecified trimester: Secondary | ICD-10-CM

## 2019-09-21 MED ORDER — ONDANSETRON 4 MG PO TBDP
4.0000 mg | ORAL_TABLET | Freq: Four times a day (QID) | ORAL | 0 refills | Status: DC | PRN
Start: 2019-09-21 — End: 2020-03-01

## 2019-09-21 NOTE — Progress Notes (Signed)
°  Subjective  Whooshing sound in ear still Anemia noted on labs Nausea still Objective  BP 110/70    Wt 228 lb (103.4 kg)    LMP 05/07/2019 (Exact Date)    BMI 32.71 kg/m  General: NAD Pumonary: no increased work of breathing Abdomen: gravid, non-tender Extremities: no edema Psychiatric: mood appropriate, affect full  Assessment  34 y.o. G2P1001 at [redacted]w[redacted]d by  02/11/2020, by Last Menstrual Period presenting for routine prenatal visit  Plan   Problem List Items Addressed This Visit      Other   Supervision of high risk pregnancy, antepartum   Hx of preeclampsia, prior pregnancy, currently pregnant   Obesity affecting pregnancy    Other Visit Diagnoses    [redacted] weeks gestation of pregnancy    -  Primary   Anemia affecting second pregnancy       Relevant Orders   Ambulatory referral to Hematology      Pregnancy#2 Problems (from 05/07/19 to present)    Problem Noted Resolved   Obesity affecting pregnancy 08/29/2019 by Conard Novak, MD No   Rh negative state in antepartum period 08/29/2019 by Conard Novak, MD No   Supervision of high risk pregnancy, antepartum 06/16/2019 by Vena Austria, MD No   Overview Addendum 08/02/2019  2:34 PM by Farrel Conners, CNM     Nursing Staff Provider  Office Location  [facility] Dating   LMP = 6 wk [place]  Language  English Anatomy US    Flu Vaccine   Genetic Screen  NIPS:   AFP:   First Screen:    TDaP vaccine    Early GTT135 Early : Third trimester :   Rhogam     LAB RESULTS   Feeding Plan  Blood Type O/Negative/-- (04/05 1019)   Contraception  Antibody Negative (04/05 1019)  Circumcision  Rubella 1.89 (04/05 1019)  Pediatrician   RPR Non Reactive (04/05 1019)   Support Person Para March FOB HBsAg Negative (04/05 1019)   Prenatal Classes  HIV Non Reactive (04/05 1019)    Varicella  Immune  BTL Consent  GBS  (For PCN allergy, check sensitivities)        VBAC Consent  Pap      Hgb Electro      CF      SMA                Previous Version     Zofran for nausea    Cont B6 Unisom  Iron pills daily for anemia  Hematology consult for this as well as consistent whooshing sound in ear like a heartbeat (right sided)   Review of ULTRASOUND. I have personally reviewed images and report of recent ultrasound done at [facility]. There is a singleton gestation with subjectively normal amniotic fluid volume. The fetal biometry correlates with established dating. Detailed evaluation of the fetal anatomy was performed.The fetal anatomical survey appears within normal limits within the resolution of ultrasound as described above.  It must be noted that a normal ultrasound is unable to rule out fetal aneuploidy.    Annamarie Major, MD, [facility], [facility] 09/21/2019  5:05 PM

## 2019-09-21 NOTE — Patient Instructions (Signed)

## 2019-10-03 ENCOUNTER — Other Ambulatory Visit: Payer: Self-pay

## 2019-10-03 ENCOUNTER — Inpatient Hospital Stay: Payer: BC Managed Care – PPO | Attending: Oncology | Admitting: Oncology

## 2019-10-03 ENCOUNTER — Encounter: Payer: Self-pay | Admitting: Oncology

## 2019-10-03 ENCOUNTER — Inpatient Hospital Stay: Payer: BC Managed Care – PPO

## 2019-10-03 VITALS — BP 108/72 | HR 71 | Temp 96.5°F | Resp 18 | Wt 225.2 lb

## 2019-10-03 DIAGNOSIS — Z79899 Other long term (current) drug therapy: Secondary | ICD-10-CM | POA: Diagnosis not present

## 2019-10-03 DIAGNOSIS — Z803 Family history of malignant neoplasm of breast: Secondary | ICD-10-CM

## 2019-10-03 DIAGNOSIS — O99012 Anemia complicating pregnancy, second trimester: Secondary | ICD-10-CM | POA: Diagnosis not present

## 2019-10-03 DIAGNOSIS — D508 Other iron deficiency anemias: Secondary | ICD-10-CM

## 2019-10-03 DIAGNOSIS — Z8349 Family history of other endocrine, nutritional and metabolic diseases: Secondary | ICD-10-CM | POA: Diagnosis not present

## 2019-10-03 DIAGNOSIS — D509 Iron deficiency anemia, unspecified: Secondary | ICD-10-CM

## 2019-10-03 DIAGNOSIS — D649 Anemia, unspecified: Secondary | ICD-10-CM

## 2019-10-03 HISTORY — DX: Iron deficiency anemia, unspecified: D50.9

## 2019-10-03 LAB — TECHNOLOGIST SMEAR REVIEW: Plt Morphology: ADEQUATE

## 2019-10-03 LAB — CBC WITH DIFFERENTIAL/PLATELET
Abs Immature Granulocytes: 0.02 10*3/uL (ref 0.00–0.07)
Basophils Absolute: 0 10*3/uL (ref 0.0–0.1)
Basophils Relative: 0 %
Eosinophils Absolute: 0.2 10*3/uL (ref 0.0–0.5)
Eosinophils Relative: 3 %
HCT: 33.4 % — ABNORMAL LOW (ref 36.0–46.0)
Hemoglobin: 10.3 g/dL — ABNORMAL LOW (ref 12.0–15.0)
Immature Granulocytes: 0 %
Lymphocytes Relative: 18 %
Lymphs Abs: 1.2 10*3/uL (ref 0.7–4.0)
MCH: 25.3 pg — ABNORMAL LOW (ref 26.0–34.0)
MCHC: 30.8 g/dL (ref 30.0–36.0)
MCV: 82.1 fL (ref 80.0–100.0)
Monocytes Absolute: 0.4 10*3/uL (ref 0.1–1.0)
Monocytes Relative: 5 %
Neutro Abs: 5 10*3/uL (ref 1.7–7.7)
Neutrophils Relative %: 74 %
Platelets: 205 10*3/uL (ref 150–400)
RBC: 4.07 MIL/uL (ref 3.87–5.11)
RDW: 20.3 % — ABNORMAL HIGH (ref 11.5–15.5)
WBC: 6.8 10*3/uL (ref 4.0–10.5)
nRBC: 0 % (ref 0.0–0.2)

## 2019-10-03 LAB — IRON AND TIBC
Iron: 60 ug/dL (ref 28–170)
Saturation Ratios: 11 % (ref 10.4–31.8)
TIBC: 528 ug/dL — ABNORMAL HIGH (ref 250–450)
UIBC: 468 ug/dL

## 2019-10-03 LAB — FERRITIN: Ferritin: 7 ng/mL — ABNORMAL LOW (ref 11–307)

## 2019-10-03 NOTE — Progress Notes (Signed)
Hematology/Oncology Consult note [facility] [phone] [phone]   Patient Care Team: Alba Cory, MD as PCP - General (Family Medicine)  REFERRING PROVIDER: Nadara Mustard, MD CHIEF COMPLAINTS/REASON FOR VISIT:  Evaluation of iron deficiency anemia  HISTORY OF PRESENTING ILLNESS:  Anna Whitehead is a  34 y.o.  female with PMH listed below was seen in consultation at the request of Nadara Mustard, MD   for evaluation of iron deficiency anemia.   Reviewed patient's recent labs  09/13/2019 labs revealed anemia with hemoglobin of 9.6, MCV 79, platelet count 262, white count 7.7, normal differential. Reviewed patient's previous labs ordered by primary care physician's office, anemia is chronic onset , duration is since January 2021.  She seems to also be anemic in 2017. blood work in 2018 showed normal hemoglobin. No aggravating or improving factors.  Associated signs and symptoms: Patient reports fatigue.  Denies SOB with exertion.  Denies weight loss, easy bruising, hematochezia, hemoptysis, hematuria. Context:  History of iron deficiency: Patient reports longstanding of iron deficiency.  She has tried oral iron supplementation which did not help to improve her deficiencies.  Currently she is in the second trimester. Rectal bleeding: Denies Menstrual bleeding/ Vaginal bleeding : Prior to current pregnancy, she has heavy menstrual period Hematemesis or hemoptysis : denies Blood in urine : denies      Review of Systems  Constitutional: Positive for fatigue. Negative for appetite change, chills and fever.  HENT:   Negative for hearing loss and voice change.   Eyes: Negative for eye problems.  Respiratory: Negative for chest tightness and cough.   Cardiovascular: Negative for chest pain.  Gastrointestinal: Negative for abdominal distention, abdominal pain and blood in stool.  Endocrine: Negative for hot flashes.  Genitourinary:  Negative for difficulty urinating and frequency.   Musculoskeletal: Negative for arthralgias.  Skin: Negative for itching and rash.  Neurological: Negative for extremity weakness.  Hematological: Negative for adenopathy.  Psychiatric/Behavioral: Negative for confusion.    MEDICAL HISTORY:  Past Medical History:  Diagnosis Date  . Allergic eczema   . Anxiety   . Depression   . Dysmenorrhea   . Family history of breast cancer    4/21 Cancer genetic testing letter sent  . Iron deficiency anemia due to chronic blood loss   . Menorrhagia   . Midline low back pain with left-sided sciatica   . Mild asthma    seasonal, not used recently  . Overweight (BMI 25.0-29.9)   . Right foot pain   . Seasonal allergic rhinitis     SURGICAL HISTORY: Past Surgical History:  Procedure Laterality Date  . MYRINGOPLASTY     Did not happen  . NO PAST SURGERIES      SOCIAL HISTORY: Social History   Socioeconomic History  . Marital status: Married    Spouse name: Para March  . Number of children: 1  . Years of education: Not on file  . Highest education level: Bachelor's degree (e.g., BA, AB, BS)  Occupational History  . Occupation: Runner, broadcasting/film/video  Tobacco Use  . Smoking status: Never Smoker  . Smokeless tobacco: Never Used  Vaping Use  . Vaping Use: Never used  Substance and Sexual Activity  . Alcohol use: Yes    Alcohol/week: 0.0 standard drinks    Comment: ocassionally  . Drug use: No  . Sexual activity: Yes    Partners: Male    Birth control/protection: None  Other Topics Concern  . Not on file  Social  History Narrative   Married, first son was born 11/13/2016      Patient is getting her Master's this summer   Social Determinants of Health   Financial Resource Strain:   . Difficulty of Paying Living Expenses:   Food Insecurity:   . Worried About Programme researcher, broadcasting/film/videounning Out of Food in the Last Year:   . Baristaan Out of Food in the Last Year:   Transportation Needs:   . Freight forwarderLack of Transportation  (Medical):   Marland Kitchen. Lack of Transportation (Non-Medical):   Physical Activity:   . Days of Exercise per Week:   . Minutes of Exercise per Session:   Stress:   . Feeling of Stress :   Social Connections:   . Frequency of Communication with Friends and Family:   . Frequency of Social Gatherings with Friends and Family:   . Attends Religious Services:   . Active Member of Clubs or Organizations:   . Attends BankerClub or Organization Meetings:   Marland Kitchen. Marital Status:   Intimate Partner Violence:   . Fear of Current or Ex-Partner:   . Emotionally Abused:   Marland Kitchen. Physically Abused:   . Sexually Abused:     FAMILY HISTORY: Family History  Problem Relation Age of Onset  . Bipolar disorder Mother   . Breast cancer Maternal Aunt 40  . Breast cancer Maternal Aunt 50  . Pancreatic cancer Maternal Aunt 70  . Ovarian cancer Maternal Great-grandmother     ALLERGIES:  is allergic to duloxetine.  MEDICATIONS:  Current Outpatient Medications  Medication Sig Dispense Refill  . doxylamine, Sleep, (UNISOM) 25 MG tablet Take 25 mg by mouth at bedtime as needed.    . famotidine (PEPCID) 20 MG tablet Take 1 tablet (20 mg total) by mouth 2 (two) times daily. (Patient taking differently: Take 20 mg by mouth daily. ) 180 tablet 1  . ferrous sulfate 325 (65 FE) MG tablet Take 325 mg by mouth daily with breakfast.    . ondansetron (ZOFRAN ODT) 4 MG disintegrating tablet Take 1 tablet (4 mg total) by mouth every 6 (six) hours as needed for nausea. 20 tablet 0  . Pyridoxine HCl (VITAMIN B-6) 250 MG tablet Take 250 mg by mouth daily.    . promethazine (PHENERGAN) 25 MG tablet Take 1 tablet (25 mg total) by mouth every 6 (six) hours as needed for nausea or vomiting. (Patient not taking: Reported on 08/29/2019) 10 tablet 1   No current facility-administered medications for this visit.     PHYSICAL EXAMINATION: ECOG PERFORMANCE STATUS: 0 - Asymptomatic Vitals:   10/03/19 0950  BP: 108/72  Pulse: 71  Resp: 18  Temp:  (!) 96.5 F (35.8 C)   Filed Weights   10/03/19 0950  Weight: 225 lb 3.2 oz (102.2 kg)    Physical Exam Constitutional:      General: She is not in acute distress. HENT:     Head: Normocephalic and atraumatic.  Eyes:     General: No scleral icterus. Cardiovascular:     Rate and Rhythm: Normal rate and regular rhythm.     Heart sounds: Normal heart sounds.  Pulmonary:     Effort: Pulmonary effort is normal. No respiratory distress.     Breath sounds: No wheezing.  Abdominal:     General: Bowel sounds are normal. There is no distension.     Palpations: Abdomen is soft.  Musculoskeletal:        General: No deformity. Normal range of motion.     Cervical  back: Normal range of motion and neck supple.  Skin:    General: Skin is warm and dry.     Findings: No erythema or rash.  Neurological:     Mental Status: She is alert and oriented to person, place, and time. Mental status is at baseline.     Cranial Nerves: No cranial nerve deficit.     Coordination: Coordination normal.  Psychiatric:        Mood and Affect: Mood normal.       CMP Latest Ref Rng & Units 09/13/2019  Glucose 65 - 99 mg/dL 94  BUN 6 - 20 mg/dL 7  Creatinine 5.80 - 9.98 mg/dL 3.38  Sodium 250 - 539 mmol/L 139  Potassium 3.5 - 5.2 mmol/L 4.2  Chloride 96 - 106 mmol/L 104  CO2 20 - 29 mmol/L 22  Calcium 8.7 - 10.2 mg/dL 8.8  Total Protein 6.0 - 8.5 g/dL 6.5  Total Bilirubin 0.0 - 1.2 mg/dL <7.6  Alkaline Phos 48 - 121 IU/L 69  AST 0 - 40 IU/L 18  ALT 0 - 32 IU/L 16   CBC Latest Ref Rng & Units 10/03/2019  WBC 4.0 - 10.5 K/uL 6.8  Hemoglobin 12.0 - 15.0 g/dL 10.3(L)  Hematocrit 36 - 46 % 33.4(L)  Platelets 150 - 400 K/uL 205     LABORATORY DATA:  I have reviewed the data as listed Lab Results  Component Value Date   WBC 6.8 10/03/2019   HGB 10.3 (L) 10/03/2019   HCT 33.4 (L) 10/03/2019   MCV 82.1 10/03/2019   PLT 205 10/03/2019   Recent Labs    04/13/19 1638 06/20/19 1019  09/13/19 1631  NA 140 138 139  K 4.4 4.2 4.2  CL 104 102 104  CO2 30 23 22   GLUCOSE 92 88 94  BUN 9 7 7   CREATININE 0.73 0.71 0.72  CALCIUM 8.8 9.1 8.8  GFRNONAA 110 114 112  GFRAA 127 131 129  PROT 5.8* 6.8 6.5  ALBUMIN  --  4.5 3.9  AST 15 13 18   ALT 12 10 16   ALKPHOS  --  54 69  BILITOT 0.2 0.2 <0.2   Iron/TIBC/Ferritin/ %Sat    Component Value Date/Time   IRON 60 10/03/2019 1056   TIBC 528 (H) 10/03/2019 1056   FERRITIN 7 (L) 10/03/2019 1056   IRONPCTSAT 11 10/03/2019 1056   IRONPCTSAT 5 (L) 04/13/2019 1638     RADIOGRAPHIC STUDIES: I have personally reviewed the radiological images as listed and agreed with the findings in the report. 10/05/2019 OB Comp + 14 Wk  Result Date: 09/23/2019 Patient Name: Anna Whitehead DOB: 03-Apr-1985 MRN: [place] ULTRASOUND REPORT Location: [facility] Date of Service: 09/21/2019 Indications:Anatomy Ultrasound Findings: Annell Greening intrauterine pregnancy is visualized with FHR at 145 BPM. Biometrics give an (U/S) Gestational age of [redacted]w[redacted]d and an (U/S) EDD of 02/13/2020; this correlates with the clinically established Estimated Date of Delivery: 02/11/20 Fetal presentation is Breech. EFW: 288 g ( 10 oz ). Placenta: posterior. Grade: 2 AFI: subjectively normal. Anatomic survey is complete and normal; Gender - female.  Impression: 1. [redacted]w[redacted]d Viable Singleton Intrauterine pregnancy. 2. (U/S) EDD is consistent with Clinically established Estimated Date of Delivery: 02/11/20 . 3. Normal Anatomy Scan Recommendations: 1.Clinical correlation with the patient's History and Physical Exam. 02/15/2020, RT Review of ULTRASOUND. I have personally reviewed images and report of recent ultrasound done at [facility]. There is a singleton gestation with subjectively normal amniotic fluid volume. The fetal biometry correlates  with established dating. Detailed evaluation of the fetal anatomy was performed.The fetal anatomical survey appears within normal limits within the  resolution of ultrasound as described above.  It must be noted that a normal ultrasound is unable to rule out fetal aneuploidy.  Annamarie Major, MD, [facility], [facility] 09/23/2019  9:56 AM       ASSESSMENT & PLAN:  1. Anemia during pregnancy in second trimester   2. Other iron deficiency anemia    Previous labs were reviewed and discussed with patient. Her last iron panel was done in January. I recommend to repeat CBC, smear, iron TIBC ferritin Today's labs are reviewed and discussed with patient. Consistent with iron deficiency anemia. Plan IV iron with Venofer 200mg  weekly x 4 doses. Allergy reactions/infusion reaction including anaphylactic reaction discussed with patient. Other side effects include but not limited to high blood pressure, skin rash, weight gain, leg swelling, fever, flulike symptoms, headache etc. also discussed with patient that IV Venofer treatments are considered to be category B medication.  Use with caution. patient voices understanding and willing to proceed.  Orders Placed This Encounter  Procedures  . CBC with Differential/Platelet    Standing Status:   Future    Number of Occurrences:   1    Standing Expiration Date:   10/02/2020  . Technologist smear review    Standing Status:   Future    Number of Occurrences:   1    Standing Expiration Date:   10/02/2020  . Ferritin    Standing Status:   Future    Number of Occurrences:   1    Standing Expiration Date:   10/02/2020  . Iron and TIBC    Standing Status:   Future    Number of Occurrences:   1    Standing Expiration Date:   10/02/2020  . CBC with Differential/Platelet    Standing Status:   Future    Standing Expiration Date:   10/02/2020  . Ferritin    Standing Status:   Future    Standing Expiration Date:   10/02/2020  . Iron and TIBC    Standing Status:   Future    Standing Expiration Date:   10/02/2020    All questions were answered. The patient knows to call the clinic with  any problems questions or concerns.  Cc 10/04/2020, MD  Return of visit: 8 weeks Thank you for this kind referral and the opportunity to participate in the care of this patient. A copy of today's note is routed to referring provider   Nadara Mustard, MD, PhD Hematology Oncology [facility] 10/03/2019

## 2019-10-03 NOTE — Progress Notes (Signed)
New patient for hematology evaluation.  She is pregnant with her due date 02/11/20.

## 2019-10-05 ENCOUNTER — Ambulatory Visit: Payer: BC Managed Care – PPO

## 2019-10-11 ENCOUNTER — Encounter: Payer: Self-pay | Admitting: Family Medicine

## 2019-10-12 ENCOUNTER — Inpatient Hospital Stay: Payer: BC Managed Care – PPO

## 2019-10-12 ENCOUNTER — Other Ambulatory Visit: Payer: Self-pay

## 2019-10-12 VITALS — BP 123/87 | HR 87 | Temp 97.0°F | Resp 18

## 2019-10-12 DIAGNOSIS — O99012 Anemia complicating pregnancy, second trimester: Secondary | ICD-10-CM

## 2019-10-12 DIAGNOSIS — D508 Other iron deficiency anemias: Secondary | ICD-10-CM | POA: Diagnosis not present

## 2019-10-12 MED ORDER — SODIUM CHLORIDE 0.9 % IV SOLN: 200.0000 mg | Freq: Once | INTRAVENOUS | Status: DC

## 2019-10-12 MED ORDER — SODIUM CHLORIDE 0.9 % IV SOLN
Freq: Once | INTRAVENOUS | Status: AC
  Filled 2019-10-12: qty 250

## 2019-10-12 MED ORDER — IRON SUCROSE 20 MG/ML IV SOLN
200.0000 mg | Freq: Once | INTRAVENOUS | Status: AC
  Administered 2019-10-12: 200 mg via INTRAVENOUS
  Filled 2019-10-12: qty 10

## 2019-10-12 NOTE — Progress Notes (Signed)
Pt tolerated Venofer infusion well with no signs of complications. VSS. RN educated pt on the importance of notifying the clinic if any complications occur at home. Pt verbalized understanding and all questions answered at this time. Pt stable for discharge.   Shaye Elling  

## 2019-10-18 ENCOUNTER — Other Ambulatory Visit: Payer: Self-pay | Admitting: Otolaryngology

## 2019-10-18 DIAGNOSIS — H93A1 Pulsatile tinnitus, right ear: Secondary | ICD-10-CM

## 2019-10-19 ENCOUNTER — Inpatient Hospital Stay: Payer: BC Managed Care – PPO | Attending: Oncology

## 2019-10-19 ENCOUNTER — Other Ambulatory Visit: Payer: Self-pay

## 2019-10-19 VITALS — BP 120/82 | HR 78 | Temp 97.0°F

## 2019-10-19 DIAGNOSIS — O99012 Anemia complicating pregnancy, second trimester: Secondary | ICD-10-CM

## 2019-10-19 DIAGNOSIS — R5383 Other fatigue: Secondary | ICD-10-CM | POA: Insufficient documentation

## 2019-10-19 DIAGNOSIS — Z818 Family history of other mental and behavioral disorders: Secondary | ICD-10-CM | POA: Diagnosis not present

## 2019-10-19 DIAGNOSIS — Z3A00 Weeks of gestation of pregnancy not specified: Secondary | ICD-10-CM | POA: Insufficient documentation

## 2019-10-19 DIAGNOSIS — Z803 Family history of malignant neoplasm of breast: Secondary | ICD-10-CM | POA: Insufficient documentation

## 2019-10-19 DIAGNOSIS — Z79899 Other long term (current) drug therapy: Secondary | ICD-10-CM | POA: Insufficient documentation

## 2019-10-19 DIAGNOSIS — Z8041 Family history of malignant neoplasm of ovary: Secondary | ICD-10-CM | POA: Diagnosis not present

## 2019-10-19 DIAGNOSIS — D509 Iron deficiency anemia, unspecified: Secondary | ICD-10-CM | POA: Diagnosis not present

## 2019-10-19 DIAGNOSIS — Z800 Family history of malignant neoplasm of digestive organs: Secondary | ICD-10-CM | POA: Diagnosis not present

## 2019-10-19 MED ORDER — IRON SUCROSE 20 MG/ML IV SOLN
200.0000 mg | Freq: Once | INTRAVENOUS | Status: AC
  Administered 2019-10-19: 200 mg via INTRAVENOUS
  Filled 2019-10-19: qty 10

## 2019-10-19 MED ORDER — SODIUM CHLORIDE 0.9 % IV SOLN
Freq: Once | INTRAVENOUS | Status: AC
  Filled 2019-10-19: qty 250

## 2019-10-19 MED ORDER — SODIUM CHLORIDE 0.9 % IV SOLN: 200.0000 mg | Freq: Once | INTRAVENOUS | Status: DC

## 2019-10-20 ENCOUNTER — Other Ambulatory Visit: Payer: Self-pay

## 2019-10-20 ENCOUNTER — Ambulatory Visit (INDEPENDENT_AMBULATORY_CARE_PROVIDER_SITE_OTHER): Payer: BC Managed Care – PPO | Admitting: Advanced Practice Midwife

## 2019-10-20 VITALS — BP 118/74 | Wt 229.0 lb

## 2019-10-20 DIAGNOSIS — Z131 Encounter for screening for diabetes mellitus: Secondary | ICD-10-CM

## 2019-10-20 DIAGNOSIS — O09299 Supervision of pregnancy with other poor reproductive or obstetric history, unspecified trimester: Secondary | ICD-10-CM

## 2019-10-20 DIAGNOSIS — O0992 Supervision of high risk pregnancy, unspecified, second trimester: Secondary | ICD-10-CM

## 2019-10-20 DIAGNOSIS — Z113 Encounter for screening for infections with a predominantly sexual mode of transmission: Secondary | ICD-10-CM

## 2019-10-20 DIAGNOSIS — Z3A23 23 weeks gestation of pregnancy: Secondary | ICD-10-CM

## 2019-10-20 DIAGNOSIS — Z130 Encounter for screening for diseases of the blood and blood-forming organs and certain disorders involving the immune mechanism: Secondary | ICD-10-CM

## 2019-10-20 DIAGNOSIS — O99212 Obesity complicating pregnancy, second trimester: Secondary | ICD-10-CM

## 2019-10-20 NOTE — Progress Notes (Signed)
Routine Prenatal Care Visit  Subjective  Anna Whitehead is a 34 y.o. G2P1001 at [redacted]w[redacted]d being seen today for ongoing prenatal care.  She is currently monitored for the following issues for this high-risk pregnancy and has Asthma, mild; H/O high risk medication treatment; Overweight; Anxiety and depression; History of gestational hypertension; Depression, major, recurrent, moderate (HCC); B12 deficiency; Vitamin D insufficiency; Supervision of high risk pregnancy, antepartum; Hx of preeclampsia, prior pregnancy, currently pregnant; Family history of breast cancer; Family history of pancreatic cancer; Obesity affecting pregnancy; Rh negative state in antepartum period; and Anemia during pregnancy in second trimester on their problem list.  ----------------------------------------------------------------------------------- Patient reports minimal improvement in anemia symptoms to this point after 2 infusions. She has 2 more infusions scheduled. She still has blood flow sound in her right ear.   Contractions: Not present. Vag. Bleeding: None.  Movement: Present. Leaking Fluid denies.  ----------------------------------------------------------------------------------- The following portions of the patient's history were reviewed and updated as appropriate: allergies, current medications, past family history, past medical history, past social history, past surgical history and problem list. Problem list updated.  Objective  Blood pressure 118/74, weight 229 lb (103.9 kg), last menstrual period 05/07/2019. Pregravid weight 240 lb (108.9 kg) Total Weight Gain -11 lb (-4.99 kg) Urinalysis: Urine Protein    Urine Glucose    Fetal Status: Fetal Heart Rate (bpm): 138 Fundal Height: 23 cm Movement: Present     General:  Alert, oriented and cooperative. Patient is in no acute distress.  Skin: Skin is warm and dry. No rash noted.   Cardiovascular: Normal heart rate noted  Respiratory: Normal respiratory  effort, no problems with respiration noted  Abdomen: Soft, gravid, appropriate for gestational age. Pain/Pressure: Absent     Pelvic:  Cervical exam deferred        Extremities: Normal range of motion.  Edema: None  Mental Status: Normal mood and affect. Normal behavior. Normal judgment and thought content.   Assessment   34 y.o. G2P1001 at [redacted]w[redacted]d by  02/11/2020, by Last Menstrual Period presenting for routine prenatal visit  Plan   Pregnancy#2 Problems (from 05/07/19 to present)    Problem Noted Resolved   Obesity affecting pregnancy 08/29/2019 by Conard Novak, MD No   Rh negative state in antepartum period 08/29/2019 by Conard Novak, MD No   Supervision of high risk pregnancy, antepartum 06/16/2019 by Vena Austria, MD No   Overview Addendum 08/02/2019  2:34 PM by Farrel Conners, CNM     Nursing Staff Provider  Office Location  [facility] Dating   LMP = 6 wk [place]  Language  English Anatomy US    Flu Vaccine   Genetic Screen  NIPS:   AFP:   First Screen:    TDaP vaccine    Early GTT135 Early : Third trimester :   Rhogam     LAB RESULTS   Feeding Plan  Blood Type O/Negative/-- (04/05 1019)   Contraception  Antibody Negative (04/05 1019)  Circumcision  Rubella 1.89 (04/05 1019)  Pediatrician   RPR Non Reactive (04/05 1019)   Support Person Para March FOB HBsAg Negative (04/05 1019)   Prenatal Classes  HIV Non Reactive (04/05 1019)    Varicella  Immune  BTL Consent  GBS  (For PCN allergy, check sensitivities)        VBAC Consent  Pap      Hgb Electro      CF      SMA  Previous Version       Preterm labor symptoms and general obstetric precautions including but not limited to vaginal bleeding, contractions, leaking of fluid and fetal movement were reviewed in detail with the patient.   Return in about 4 weeks (around 11/17/2019) for 28 wk labs and rob.  Tresea Mall, CNM 10/20/2019 4:36 PM

## 2019-10-20 NOTE — Progress Notes (Signed)
No vb. No lof. Still having some nausea. Has gotten 2 iron infusions.

## 2019-10-26 ENCOUNTER — Inpatient Hospital Stay: Payer: BC Managed Care – PPO

## 2019-10-26 ENCOUNTER — Other Ambulatory Visit: Payer: Self-pay

## 2019-10-26 VITALS — BP 114/79 | HR 81 | Temp 97.6°F | Resp 18

## 2019-10-26 DIAGNOSIS — O99012 Anemia complicating pregnancy, second trimester: Secondary | ICD-10-CM | POA: Diagnosis not present

## 2019-10-26 MED ORDER — SODIUM CHLORIDE 0.9 % IV SOLN: 200.0000 mg | Freq: Once | INTRAVENOUS | Status: DC

## 2019-10-26 MED ORDER — SODIUM CHLORIDE 0.9 % IV SOLN
Freq: Once | INTRAVENOUS | Status: AC
  Filled 2019-10-26: qty 250

## 2019-10-26 MED ORDER — IRON SUCROSE 20 MG/ML IV SOLN
200.0000 mg | Freq: Once | INTRAVENOUS | Status: AC
  Administered 2019-10-26: 200 mg via INTRAVENOUS
  Filled 2019-10-26: qty 10

## 2019-10-26 NOTE — Progress Notes (Signed)
Pt tolerated infusion well, no s/s of distress or reaction noted. Pt and Vs stable at discharge.

## 2019-11-03 ENCOUNTER — Inpatient Hospital Stay: Payer: BC Managed Care – PPO

## 2019-11-03 ENCOUNTER — Other Ambulatory Visit: Payer: Self-pay

## 2019-11-03 VITALS — BP 114/71 | HR 88 | Temp 97.0°F | Resp 18

## 2019-11-03 DIAGNOSIS — O99012 Anemia complicating pregnancy, second trimester: Secondary | ICD-10-CM | POA: Diagnosis not present

## 2019-11-03 MED ORDER — IRON SUCROSE 20 MG/ML IV SOLN
200.0000 mg | Freq: Once | INTRAVENOUS | Status: AC
  Administered 2019-11-03: 200 mg via INTRAVENOUS
  Filled 2019-11-03: qty 10

## 2019-11-03 MED ORDER — SODIUM CHLORIDE 0.9 % IV SOLN: 200.0000 mg | Freq: Once | INTRAVENOUS | Status: DC

## 2019-11-03 MED ORDER — SODIUM CHLORIDE 0.9 % IV SOLN
Freq: Once | INTRAVENOUS | Status: AC
  Filled 2019-11-03: qty 250

## 2019-11-03 NOTE — Progress Notes (Signed)
Pt tolerated infusion well. No s/s of distress or reaction noted. Pt and VS stable at discharge.  

## 2019-11-18 ENCOUNTER — Encounter: Payer: BC Managed Care – PPO | Admitting: Obstetrics and Gynecology

## 2019-11-18 ENCOUNTER — Other Ambulatory Visit: Payer: BC Managed Care – PPO

## 2019-11-25 ENCOUNTER — Other Ambulatory Visit: Payer: BC Managed Care – PPO

## 2019-11-25 ENCOUNTER — Other Ambulatory Visit: Payer: Self-pay

## 2019-11-25 ENCOUNTER — Ambulatory Visit (INDEPENDENT_AMBULATORY_CARE_PROVIDER_SITE_OTHER): Payer: BC Managed Care – PPO | Admitting: Obstetrics

## 2019-11-25 VITALS — BP 110/70 | Wt 230.0 lb

## 2019-11-25 DIAGNOSIS — Z6791 Unspecified blood type, Rh negative: Secondary | ICD-10-CM | POA: Diagnosis not present

## 2019-11-25 DIAGNOSIS — O26899 Other specified pregnancy related conditions, unspecified trimester: Secondary | ICD-10-CM | POA: Diagnosis not present

## 2019-11-25 DIAGNOSIS — O0992 Supervision of high risk pregnancy, unspecified, second trimester: Secondary | ICD-10-CM

## 2019-11-25 DIAGNOSIS — Z23 Encounter for immunization: Secondary | ICD-10-CM

## 2019-11-25 DIAGNOSIS — Z113 Encounter for screening for infections with a predominantly sexual mode of transmission: Secondary | ICD-10-CM

## 2019-11-25 DIAGNOSIS — Z131 Encounter for screening for diabetes mellitus: Secondary | ICD-10-CM

## 2019-11-25 DIAGNOSIS — Z130 Encounter for screening for diseases of the blood and blood-forming organs and certain disorders involving the immune mechanism: Secondary | ICD-10-CM

## 2019-11-25 DIAGNOSIS — Z3A28 28 weeks gestation of pregnancy: Secondary | ICD-10-CM | POA: Diagnosis not present

## 2019-11-25 DIAGNOSIS — O0990 Supervision of high risk pregnancy, unspecified, unspecified trimester: Secondary | ICD-10-CM

## 2019-11-25 LAB — POCT URINALYSIS DIPSTICK OB
Glucose, UA: NEGATIVE
POC,PROTEIN,UA: NEGATIVE

## 2019-11-25 MED ORDER — RHO D IMMUNE GLOBULIN 1500 UNIT/2ML IJ SOSY
300.0000 ug | PREFILLED_SYRINGE | Freq: Once | INTRAMUSCULAR | Status: AC
  Administered 2019-11-25: 300 ug via INTRAMUSCULAR

## 2019-11-25 NOTE — Progress Notes (Signed)
ROB/28 wk labs- feels tired, flu shot today, Rhogam today

## 2019-11-25 NOTE — Addendum Note (Signed)
Addended by: Donnetta Hail on: 11/25/2019 04:23 PM   Modules accepted: Orders

## 2019-11-25 NOTE — Progress Notes (Signed)
Routine Prenatal Care Visit  Subjective  Anna Whitehead is a 34 y.o. G2P1001 at [redacted]w[redacted]d being seen today for ongoing prenatal care.  She is currently monitored for the following issues for this high-risk pregnancy and has Asthma, mild; H/O high risk medication treatment; Overweight; Anxiety and depression; History of gestational hypertension; Depression, major, recurrent, moderate (HCC); B12 deficiency; Vitamin D insufficiency; Supervision of high risk pregnancy, antepartum; Hx of preeclampsia, prior pregnancy, currently pregnant; Family history of breast cancer; Family history of pancreatic cancer; Obesity affecting pregnancy; Rh negative state in antepartum period; and Anemia during pregnancy in second trimester on their problem list.  ----------------------------------------------------------------------------------- Patient reports no bleeding, no contractions, no cramping, no leaking and and she has continued seeing a hematologist for her anemia. Has had several iron infusions at the hospital.They have named the baby "Hedwig Morton".    .  .   Pincus Large Fluid denies.  ----------------------------------------------------------------------------------- The following portions of the patient's history were reviewed and updated as appropriate: allergies, current medications, past family history, past medical history, past social history, past surgical history and problem list. Problem list updated.  Objective  Blood pressure 110/70, weight 230 lb (104.3 kg), last menstrual period 05/07/2019. Pregravid weight 240 lb (108.9 kg) Total Weight Gain -10 lb (-4.536 kg) Urinalysis: Urine Protein Negative  Urine Glucose Negative  Fetal Status:           General:  Alert, oriented and cooperative. Patient is in no acute distress.  Skin: Skin is warm and dry. No rash noted.   Cardiovascular: Normal heart rate noted  Respiratory: Normal respiratory effort, no problems with respiration noted  Abdomen: Soft,  gravid, appropriate for gestational age.       Pelvic:  Cervical exam deferred        Extremities: Normal range of motion.     Mental Status: Normal mood and affect. Normal behavior. Normal judgment and thought content.   Assessment   34 y.o. G2P1001 at [redacted]w[redacted]d by  02/11/2020, by Last Menstrual Period presenting for routine prenatal visit  Plan   Pregnancy#2 Problems (from 05/07/19 to present)    Problem Noted Resolved   Obesity affecting pregnancy 08/29/2019 by Conard Novak, MD No   Rh negative state in antepartum period 08/29/2019 by Conard Novak, MD No   Overview Signed 11/25/2019  3:44 PM by Mirna Mires, CNM    Rhogam 9/10      Supervision of high risk pregnancy, antepartum 06/16/2019 by Vena Austria, MD No   Overview Addendum 11/25/2019  3:43 PM by Mirna Mires, CNM     Nursing Staff Provider  Office Location  [facility] Dating   LMP = 6 wk [place]  Language  English Anatomy US    Flu Vaccine   Genetic Screen  NIPS:   AFP:   First Screen:  Declined MaternT  TDaP vaccine    Early GTT135 Early :9/10 Third trimester : 9/10  Rhogam     LAB RESULTS   Feeding Plan  Blood Type O/Negative/-- (04/05 1019)   Contraception  Antibody Negative (04/05 1019)  Circumcision  Rubella 1.89 (04/05 1019)  Pediatrician   RPR Non Reactive (04/05 1019)   Support Person Para March FOB HBsAg Negative (04/05 1019)   Prenatal Classes  HIV Non Reactive (04/05 1019)    Varicella  Immune  BTL Consent  GBS  (For PCN allergy, check sensitivities)        VBAC Consent  Pap      Hgb Electro  CF      SMA               Previous Version       Preterm labor symptoms and general obstetric precautions including but not limited to vaginal bleeding, contractions, leaking of fluid and fetal movement were reviewed in detail with the patient. Please refer to After Visit Summary for other counseling recommendations.  Rhogam today. Return in about 2 weeks (around 12/09/2019) for return  OB.  Mirna Mires, CNM  11/25/2019 3:44 PM

## 2019-11-26 LAB — 28 WEEKS RH-PANEL
Antibody Screen: NEGATIVE
Basophils Absolute: 0 10*3/uL (ref 0.0–0.2)
Basos: 0 %
EOS (ABSOLUTE): 0.3 10*3/uL (ref 0.0–0.4)
Eos: 4 %
Gestational Diabetes Screen: 127 mg/dL (ref 65–139)
HIV Screen 4th Generation wRfx: NONREACTIVE
Hematocrit: 37.2 % (ref 34.0–46.6)
Hemoglobin: 12.1 g/dL (ref 11.1–15.9)
Immature Grans (Abs): 0 10*3/uL (ref 0.0–0.1)
Immature Granulocytes: 0 %
Lymphocytes Absolute: 1.4 10*3/uL (ref 0.7–3.1)
Lymphs: 19 %
MCH: 30 pg (ref 26.6–33.0)
MCHC: 32.5 g/dL (ref 31.5–35.7)
MCV: 92 fL (ref 79–97)
Monocytes Absolute: 0.4 10*3/uL (ref 0.1–0.9)
Monocytes: 5 %
Neutrophils Absolute: 5.3 10*3/uL (ref 1.4–7.0)
Neutrophils: 72 %
Platelets: 248 10*3/uL (ref 150–450)
RBC: 4.04 x10E6/uL (ref 3.77–5.28)
RDW: 20.9 % — ABNORMAL HIGH (ref 11.7–15.4)
RPR Ser Ql: NONREACTIVE
WBC: 7.4 10*3/uL (ref 3.4–10.8)

## 2019-11-28 ENCOUNTER — Other Ambulatory Visit: Payer: Self-pay | Admitting: Otolaryngology

## 2019-11-28 ENCOUNTER — Telehealth: Payer: Self-pay

## 2019-11-28 DIAGNOSIS — H93A1 Pulsatile tinnitus, right ear: Secondary | ICD-10-CM

## 2019-11-28 NOTE — Telephone Encounter (Signed)
Message received from Dr. Cathie Hoops to cancel her IV venofer for next visit.  Keep other appts as planned. Her ENT requests infusion to be held.  No need to inform patient of cancellation since this for posible infusion.

## 2019-11-28 NOTE — Telephone Encounter (Signed)
Done.. 12/05/19 Venofer appt has been cx

## 2019-11-29 ENCOUNTER — Ambulatory Visit: Admission: RE | Admit: 2019-11-29 | Payer: BC Managed Care – PPO | Source: Ambulatory Visit

## 2019-12-01 ENCOUNTER — Telehealth: Payer: Self-pay

## 2019-12-01 NOTE — Telephone Encounter (Signed)
Patient reports she vomited recently and hadn't eaten anything in a while. It looks like mostly bile and saw some blood (not much) just saw some mixed in there. Inquiring if should be concerned. JA#[phone]

## 2019-12-01 NOTE — Telephone Encounter (Signed)
Spoke w/patient. She vomits daily, unfortunately. Advised can be normal, could be from stomach contents, possible burst blood vessels in esophagus during vomiting. Monitor/report worsening/increased symtoms.

## 2019-12-02 ENCOUNTER — Telehealth: Payer: Self-pay | Admitting: Oncology

## 2019-12-02 ENCOUNTER — Inpatient Hospital Stay: Payer: BC Managed Care – PPO | Attending: Oncology

## 2019-12-02 ENCOUNTER — Other Ambulatory Visit: Payer: Self-pay

## 2019-12-02 DIAGNOSIS — D509 Iron deficiency anemia, unspecified: Secondary | ICD-10-CM | POA: Insufficient documentation

## 2019-12-02 DIAGNOSIS — O99012 Anemia complicating pregnancy, second trimester: Secondary | ICD-10-CM

## 2019-12-02 LAB — CBC WITH DIFFERENTIAL/PLATELET
Abs Immature Granulocytes: 0.03 10*3/uL (ref 0.00–0.07)
Basophils Absolute: 0 10*3/uL (ref 0.0–0.1)
Basophils Relative: 1 %
Eosinophils Absolute: 0.4 10*3/uL (ref 0.0–0.5)
Eosinophils Relative: 5 %
HCT: 34.8 % — ABNORMAL LOW (ref 36.0–46.0)
Hemoglobin: 11.5 g/dL — ABNORMAL LOW (ref 12.0–15.0)
Immature Granulocytes: 0 %
Lymphocytes Relative: 20 %
Lymphs Abs: 1.6 10*3/uL (ref 0.7–4.0)
MCH: 30.1 pg (ref 26.0–34.0)
MCHC: 33 g/dL (ref 30.0–36.0)
MCV: 91.1 fL (ref 80.0–100.0)
Monocytes Absolute: 0.4 10*3/uL (ref 0.1–1.0)
Monocytes Relative: 5 %
Neutro Abs: 5.4 10*3/uL (ref 1.7–7.7)
Neutrophils Relative %: 69 %
Platelets: 221 10*3/uL (ref 150–400)
RBC: 3.82 MIL/uL — ABNORMAL LOW (ref 3.87–5.11)
RDW: 21.2 % — ABNORMAL HIGH (ref 11.5–15.5)
WBC: 7.8 10*3/uL (ref 4.0–10.5)
nRBC: 0 % (ref 0.0–0.2)

## 2019-12-02 LAB — IRON AND TIBC
Iron: 62 ug/dL (ref 28–170)
Saturation Ratios: 11 % (ref 10.4–31.8)
TIBC: 547 ug/dL — ABNORMAL HIGH (ref 250–450)
UIBC: 485 ug/dL

## 2019-12-02 LAB — FERRITIN: Ferritin: 18 ng/mL (ref 11–307)

## 2019-12-02 NOTE — Telephone Encounter (Signed)
Patient phoned on this date and rescheduled 12-05-19 appt to 12-06-19.

## 2019-12-04 ENCOUNTER — Encounter: Payer: Self-pay | Admitting: Oncology

## 2019-12-05 ENCOUNTER — Ambulatory Visit (INDEPENDENT_AMBULATORY_CARE_PROVIDER_SITE_OTHER): Payer: BC Managed Care – PPO | Admitting: Obstetrics

## 2019-12-05 ENCOUNTER — Inpatient Hospital Stay: Payer: BC Managed Care – PPO | Admitting: Oncology

## 2019-12-05 ENCOUNTER — Other Ambulatory Visit: Payer: Self-pay

## 2019-12-05 ENCOUNTER — Ambulatory Visit: Payer: BC Managed Care – PPO

## 2019-12-05 VITALS — BP 110/70 | Wt 229.0 lb

## 2019-12-05 DIAGNOSIS — O0990 Supervision of high risk pregnancy, unspecified, unspecified trimester: Secondary | ICD-10-CM | POA: Diagnosis not present

## 2019-12-05 DIAGNOSIS — Z3A30 30 weeks gestation of pregnancy: Secondary | ICD-10-CM | POA: Diagnosis not present

## 2019-12-05 DIAGNOSIS — Z23 Encounter for immunization: Secondary | ICD-10-CM | POA: Diagnosis not present

## 2019-12-05 LAB — POCT URINALYSIS DIPSTICK OB
Glucose, UA: NEGATIVE
POC,PROTEIN,UA: NEGATIVE

## 2019-12-05 NOTE — Progress Notes (Signed)
No concerns.rj 

## 2019-12-05 NOTE — Progress Notes (Signed)
Routine Prenatal Care Visit  Subjective  Anna Whitehead is a 34 y.o. G2P1001 at [redacted]w[redacted]d being seen today for ongoing prenatal care.  She is currently monitored for the following issues for this low-risk pregnancy and has Asthma, mild; H/O high risk medication treatment; Overweight; Anxiety and depression; History of gestational hypertension; Depression, major, recurrent, moderate (HCC); B12 deficiency; Vitamin D insufficiency; Supervision of high risk pregnancy, antepartum; Hx of preeclampsia, prior pregnancy, currently pregnant; Family history of breast cancer; Family history of pancreatic cancer; Obesity affecting pregnancy; Rh negative state in antepartum period; and Anemia during pregnancy in second trimester on their problem list.  ----------------------------------------------------------------------------------- Patient reports heartburn, no bleeding, no leaking and vomiting.  She continues on a daily Pepcid and still throws up once daily.  .  .   Pincus Large Fluid denies.  ----------------------------------------------------------------------------------- The following portions of the patient's history were reviewed and updated as appropriate: allergies, current medications, past family history, past medical history, past social history, past surgical history and problem list. Problem list updated.  Objective  Blood pressure 110/70, weight 229 lb (103.9 kg), last menstrual period 05/07/2019. Pregravid weight 240 lb (108.9 kg) Total Weight Gain -11 lb (-4.99 kg) Urinalysis: Urine Protein Negative  Urine Glucose Negative  Fetal Status:           General:  Alert, oriented and cooperative. Patient is in no acute distress.  Skin: Skin is warm and dry. No rash noted.   Cardiovascular: Normal heart rate noted  Respiratory: Normal respiratory effort, no problems with respiration noted  Abdomen: Soft, gravid, appropriate for gestational age.       Pelvic:  Cervical exam deferred          Extremities: Normal range of motion.     Mental Status: Normal mood and affect. Normal behavior. Normal judgment and thought content.   Assessment   34 y.o. G2P1001 at [redacted]w[redacted]d by  02/11/2020, by Last Menstrual Period presenting for routine prenatal visit  Plan   Pregnancy#2 Problems (from 05/07/19 to present)    Problem Noted Resolved   Obesity affecting pregnancy 08/29/2019 by Conard Novak, MD No   Rh negative state in antepartum period 08/29/2019 by Conard Novak, MD No   Overview Signed 11/25/2019  3:44 PM by Mirna Mires, CNM    Rhogam 9/10      Supervision of high risk pregnancy, antepartum 06/16/2019 by Vena Austria, MD No   Overview Addendum 11/25/2019  3:43 PM by Mirna Mires, CNM     Nursing Staff Provider  Office Location  [facility] Dating   LMP = 6 wk [place]  Language  English Anatomy US    Flu Vaccine   Genetic Screen  NIPS:   AFP:   First Screen:  Declined MaternT  TDaP vaccine    Early GTT135 Early :9/10 Third trimester :   Rhogam     LAB RESULTS   Feeding Plan  Blood Type O/Negative/-- (04/05 1019)   Contraception  Antibody Negative (04/05 1019)  Circumcision  Rubella 1.89 (04/05 1019)  Pediatrician   RPR Non Reactive (04/05 1019)   Support Person Anna Whitehead FOB HBsAg Negative (04/05 1019)   Prenatal Classes  HIV Non Reactive (04/05 1019)    Varicella  Immune  BTL Consent  GBS  (For PCN allergy, check sensitivities)        VBAC Consent  Pap      Hgb Electro      CF      SMA  Previous Version       Preterm labor symptoms and general obstetric precautions including but not limited to vaginal bleeding, contractions, leaking of fluid and fetal movement were reviewed in detail with the patient. Please refer to After Visit Summary for other counseling recommendations.  She will increase her dosage of Pepcid. We discussed her continuing with iron infusions with heme physicians.  Return in about 2 weeks (around 12/19/2019) for  return OB.  Mirna Mires, CNM  12/05/2019 5:56 PM

## 2019-12-06 ENCOUNTER — Inpatient Hospital Stay: Payer: BC Managed Care – PPO | Admitting: Oncology

## 2019-12-14 ENCOUNTER — Encounter: Payer: Self-pay | Admitting: Obstetrics & Gynecology

## 2019-12-14 ENCOUNTER — Ambulatory Visit (INDEPENDENT_AMBULATORY_CARE_PROVIDER_SITE_OTHER): Payer: BC Managed Care – PPO | Admitting: Obstetrics & Gynecology

## 2019-12-14 ENCOUNTER — Other Ambulatory Visit: Payer: Self-pay | Admitting: Obstetrics and Gynecology

## 2019-12-14 ENCOUNTER — Other Ambulatory Visit: Payer: Self-pay

## 2019-12-14 VITALS — BP 90/60 | Wt 228.0 lb

## 2019-12-14 DIAGNOSIS — R21 Rash and other nonspecific skin eruption: Secondary | ICD-10-CM | POA: Diagnosis not present

## 2019-12-14 DIAGNOSIS — R55 Syncope and collapse: Secondary | ICD-10-CM | POA: Diagnosis not present

## 2019-12-14 DIAGNOSIS — I959 Hypotension, unspecified: Secondary | ICD-10-CM | POA: Diagnosis not present

## 2019-12-14 DIAGNOSIS — O219 Vomiting of pregnancy, unspecified: Secondary | ICD-10-CM

## 2019-12-14 NOTE — Progress Notes (Signed)
  Pt is a 34 yo G2P1 (she is [redacted] weeks pregnant) here w recent c/o episode of feeling faint, and even feeling world go dark and quiet, while driving and had to pull over of course.  She had whooshing sound in head, right sided, sees Dr Pryor Ochoa and plans for MRI head this Friday.  Has had nausea throughout pregnancy, limited to every evening one episode, feels better after.  Has had anemia and has been ytreated thru Hematology w infusion therapy, has plans to restart after MRI next week for additional infusion therapy.  Also reports month long fascial red raised rash, no other body parts involved.    PMHx: She  has a past medical history of Allergic eczema, Anxiety, Depression, Dysmenorrhea, Family history of breast cancer, Iron deficiency anemia (10/03/2019), Iron deficiency anemia due to chronic blood loss, Menorrhagia, Midline low back pain with left-sided sciatica, Mild asthma, Overweight (BMI 25.0-29.9), Right foot pain, and Seasonal allergic rhinitis. Also,  has a past surgical history that includes Myringoplasty and No past surgeries., family history includes Bipolar disorder in her mother; Breast cancer (age of onset: 14) in her maternal aunt; Breast cancer (age of onset: 19) in her maternal aunt; Ovarian cancer in her maternal great-grandmother; Pancreatic cancer (age of onset: 30) in her maternal aunt.,  reports that she has never smoked. She has never used smokeless tobacco. She reports current alcohol use. She reports that she does not use drugs.  She has a current medication list which includes the following prescription(s): doxylamine (sleep), famotidine, ferrous sulfate, ondansetron, promethazine, and vitamin b-6. Also, has No Known Allergies.  Review of Systems  Skin: Positive for rash.  All other systems reviewed and are negative.   Objective: BP 90/60   Wt 228 lb (103.4 kg)   LMP 05/07/2019 (Exact Date)   BMI 32.71 kg/m  Physical Exam Constitutional:      General: She is not in  acute distress.    Appearance: She is well-developed.  Musculoskeletal:        General: Normal range of motion.  Neurological:     Mental Status: She is alert and oriented to person, place, and time.  Skin:    General: Skin is warm and dry.  Vitals reviewed.     ASSESSMENT/PLAN:    Problem List Items Addressed This Visit    Rash of face    -  Primary Mask of pregnancy, Lupus flare/rash, other Consider Derm referral Labs (tomorrow based on time of day) to assess   Relevant Orders   Extractable Nuclear antigen ab   Antinuclear Antib (ANA)   C3 and C4   Sed Rate (ESR)   Cardiolipin antibodies, IgG, IgM, IgA   Hypotension, unspecified hypotension type        Hydration, diet, activity discussed   Nausea and vomiting in pregnancy    No meds as have not helped in past, doing OK       Near syncope        Hydration, monitor for recurrences    Consider referral based on severity      Barnett Applebaum, MD, [place], [facility] 12/14/2019  4:55 PM

## 2019-12-14 NOTE — Telephone Encounter (Signed)
Called and left voicemail for patient to call back to be scheduled. 

## 2019-12-14 NOTE — Telephone Encounter (Signed)
I would schedule the patient to be seen for a visit in office this week so we can check her BP.  I will place a dermatology referral for this rash. Thank you

## 2019-12-15 ENCOUNTER — Other Ambulatory Visit: Payer: BC Managed Care – PPO

## 2019-12-15 DIAGNOSIS — R21 Rash and other nonspecific skin eruption: Secondary | ICD-10-CM

## 2019-12-16 ENCOUNTER — Ambulatory Visit
Admission: RE | Admit: 2019-12-16 | Discharge: 2019-12-16 | Disposition: A | Discharge status: Home / Self Care | Payer: BC Managed Care – PPO | Source: Ambulatory Visit | Attending: Otolaryngology | Admitting: Otolaryngology

## 2019-12-16 ENCOUNTER — Other Ambulatory Visit: Payer: Self-pay

## 2019-12-16 DIAGNOSIS — H93A1 Pulsatile tinnitus, right ear: Secondary | ICD-10-CM | POA: Insufficient documentation

## 2019-12-16 LAB — EXTRACTABLE NUCLEAR ANTIGEN ANTIBODY
ENA RNP Ab: 0.5 AI (ref 0.0–0.9)
ENA SM Ab Ser-aCnc: <0.2 AI (ref 0.0–0.9)
ENA SSA (RO) Ab: <0.2 AI (ref 0.0–0.9)
ENA SSB (LA) Ab: <0.2 AI (ref 0.0–0.9)
Scleroderma (Scl-70) (ENA) Antibody, IgG: <0.2 AI (ref 0.0–0.9)
dsDNA Ab: <1 IU/mL (ref 0–9)

## 2019-12-16 LAB — SEDIMENTATION RATE: Sed Rate: 16 mm/hr (ref 0–32)

## 2019-12-16 LAB — CARDIOLIPIN ANTIBODIES, IGG, IGM, IGA
Anticardiolipin IgA: <9 APL U/mL (ref 0–11)
Anticardiolipin IgG: <9 GPL U/mL (ref 0–14)
Anticardiolipin IgM: <9 MPL U/mL (ref 0–12)

## 2019-12-16 LAB — C3 AND C4
Complement C3, Serum: 153 mg/dL (ref 82–167)
Complement C4, Serum: 42 mg/dL — ABNORMAL HIGH (ref 12–38)

## 2019-12-16 LAB — ANA: Anti Nuclear Antibody (ANA): NEGATIVE

## 2019-12-16 IMAGING — MR MR MRA HEAD W/O CM
1 series · 19 of 48 positions shown · non-contrast
Comparison: None.

CLINICAL DATA: Right-sided pulsatile tinnitus for 3-4 months.
Thirty-two weeks pregnant.

EXAM:
MRI HEAD WITHOUT CONTRAST
MRA HEAD WITHOUT CONTRAST
TECHNIQUE: Multiplanar, multiecho pulse sequences of the brain and surrounding
structures were obtained without intravenous contrast. Angiographic
images of the head were obtained using MRA technique without
contrast.

[Series 1: TOF · axial · 0.5mm · 0.41mm/px · z∈[-91,+7]mm · 19 of 205 slices shown]
[im 1/205]
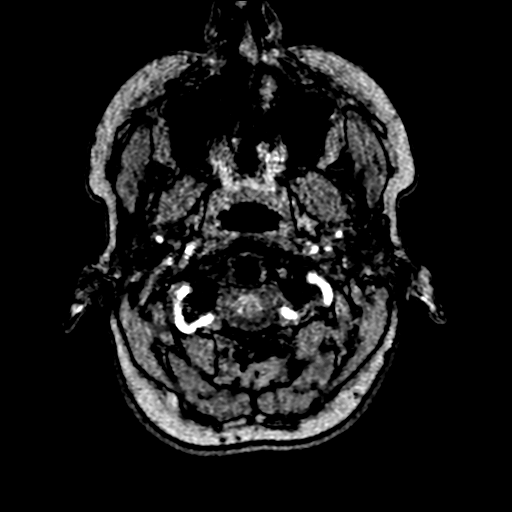
[im 5/205]
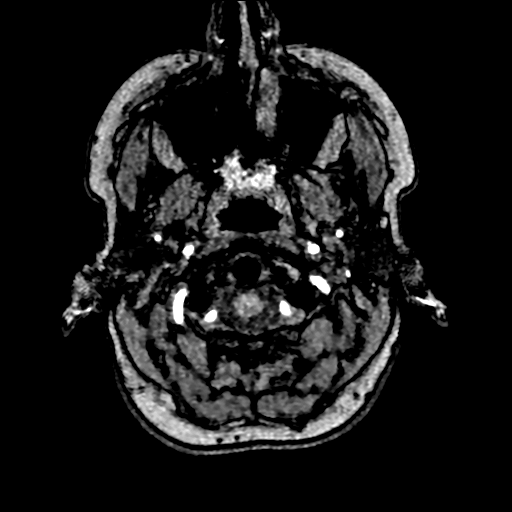
[im 9/205]
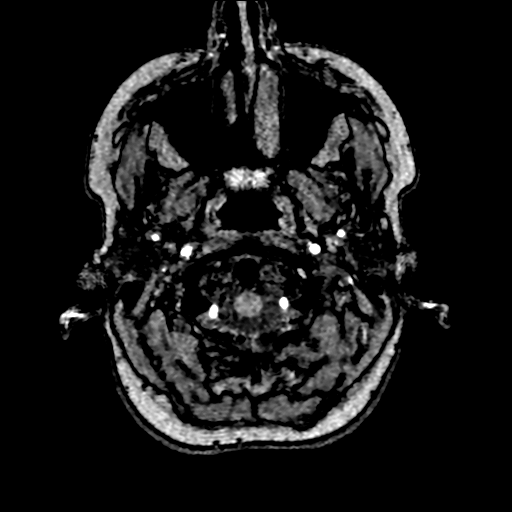
[im 14/205]
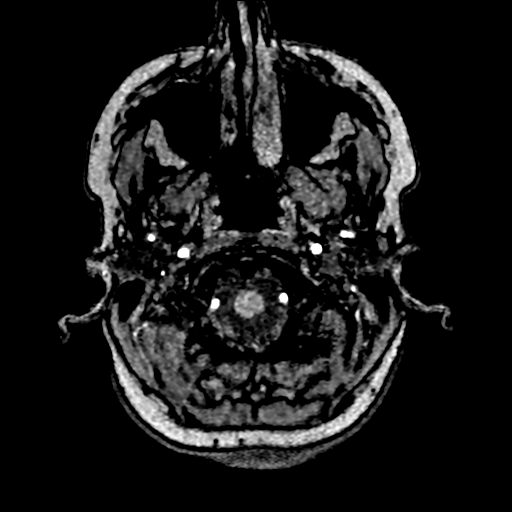
[im 18/205]
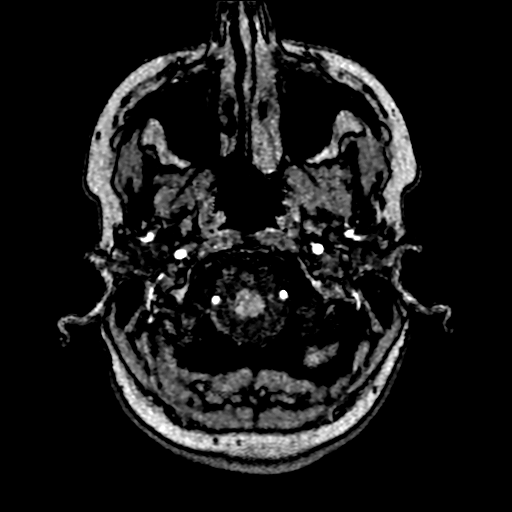
[im 22/205]
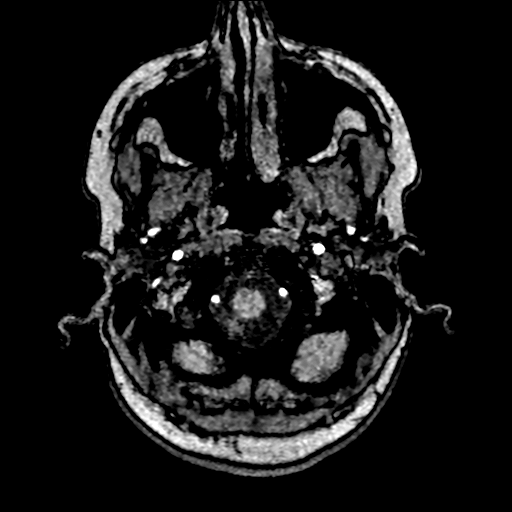
[im 27/205]
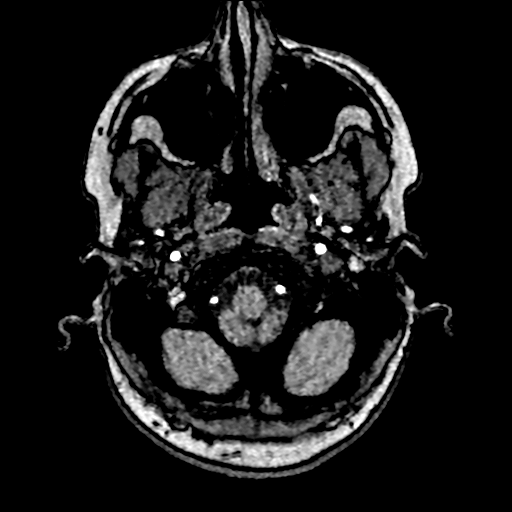
[im 31/205]
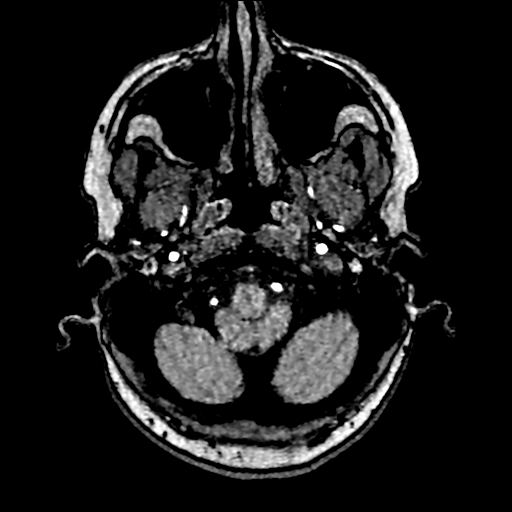
[im 35/205]
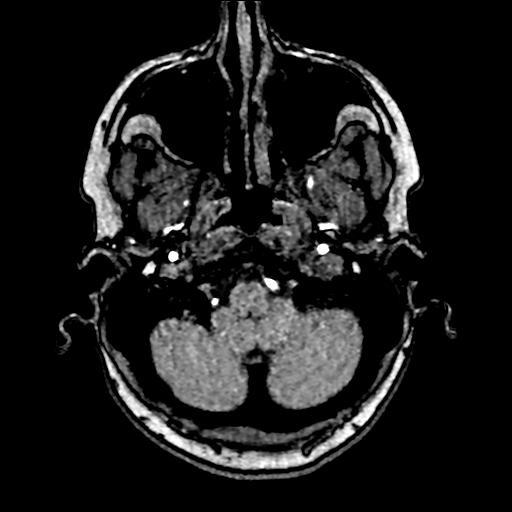
[im 40/205]
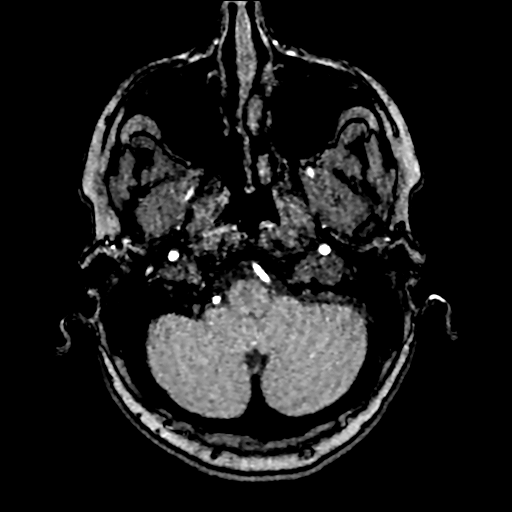
[im 44/205]
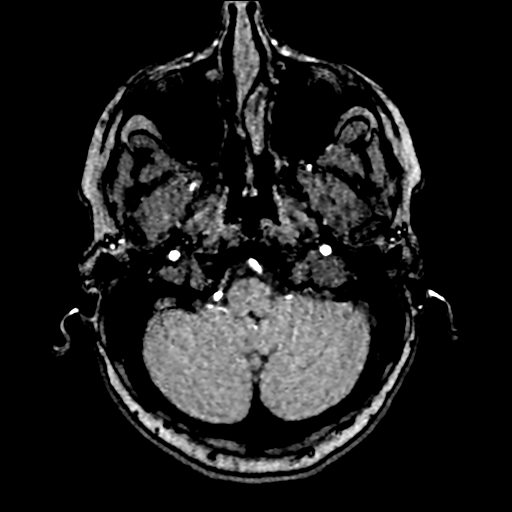
[im 66/205]
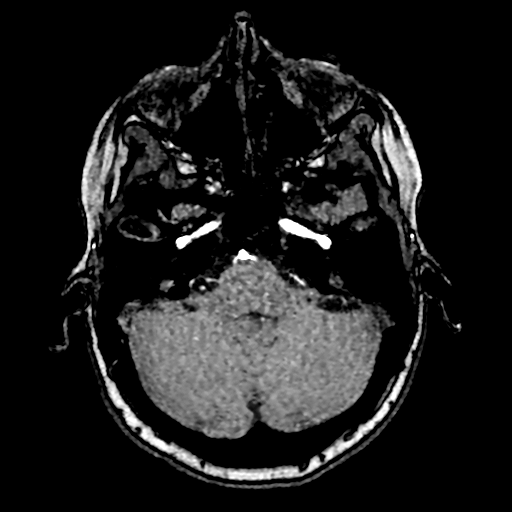
[im 92/205]
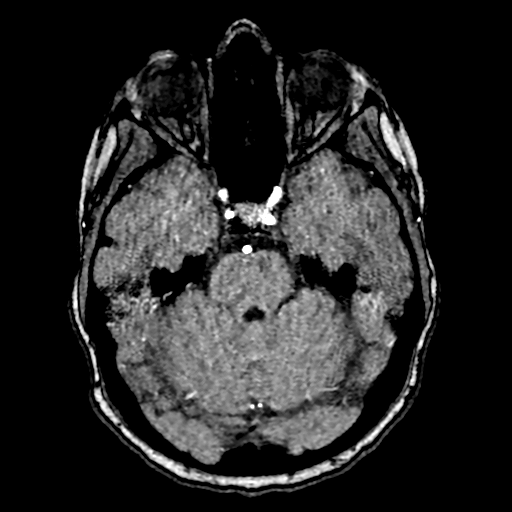
[im 105/205]
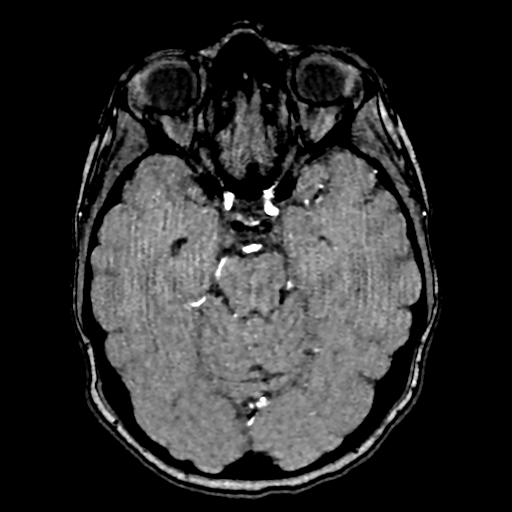
[im 118/205]
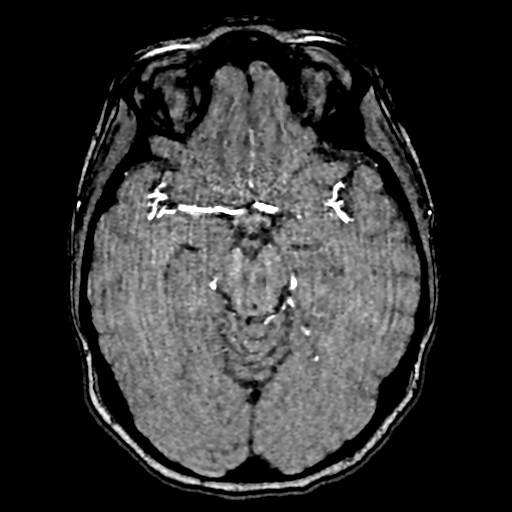
[im 144/205]
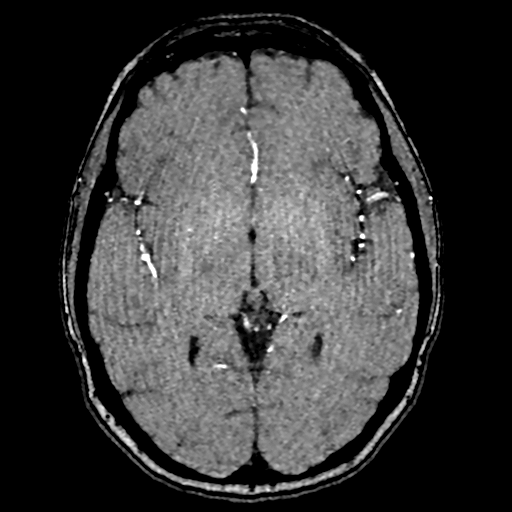
[im 170/205]
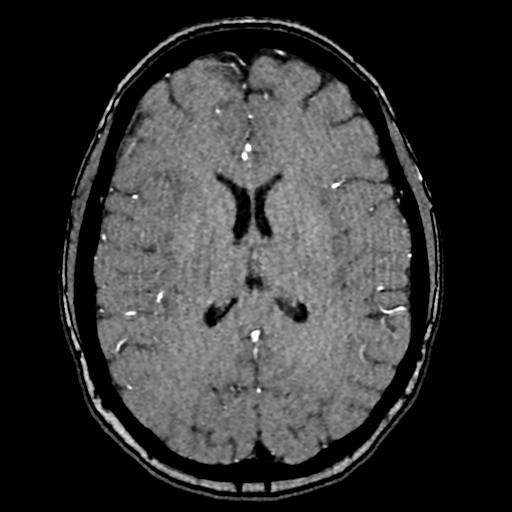
[im 174/205]
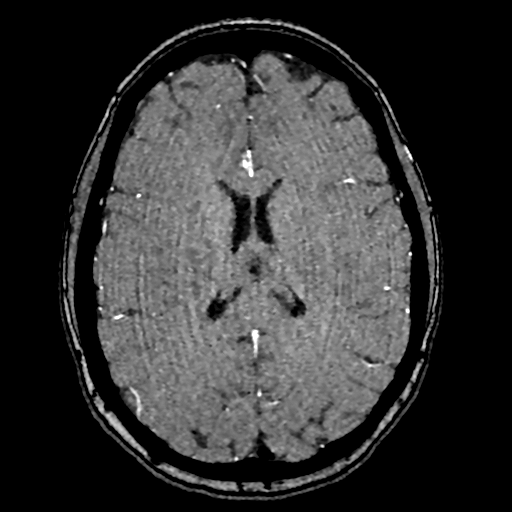
[im 196/205]
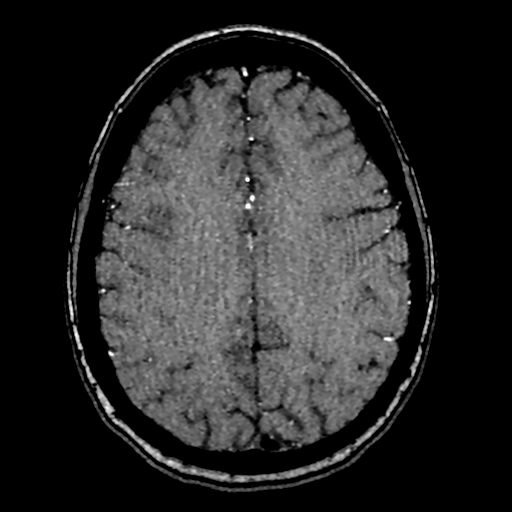

[19 of 48 positions shown; findings below may reference images not displayed]

FINDINGS: MRI HEAD FINDINGS

Brain: No acute infarct, intracranial hemorrhage, mass, midline
shift, or extra-axial fluid collection is identified. There is
herniation of a small portion of the posteroinferior right temporal
lobe into the distal right transverse sinus with suspected mild
gliosis within the herniated brain parenchyma (series 5, image 4 and
series 15, images 10 and 11). The brain is normal in signal
elsewhere. The ventricles and sulci are normal.

Dedicated imaging through the internal auditory canals demonstrates
a normal course of cranial nerves VII and VIII without evidence of a
mass on this unenhanced study. Inner ear structures demonstrate
normal signal bilaterally.

Vascular: Major intracranial vascular flow voids are preserved.

Skull and upper cervical spine: Diffusely decreased bone marrow T1
signal intensity consistent with the patient's history of anemia.

Sinuses/Orbits: Unremarkable orbits. Paranasal sinuses and mastoid
air cells are clear.

Other: None.

MRA HEAD FINDINGS

The visualized distal vertebral arteries are widely patent to the
basilar and codominant. Patent PICA, AICA, and SCA origins are
visualized bilaterally. The basilar artery is widely patent. There
may be small posterior communicating arteries bilaterally. The PCAs
are patent without evidence of a significant proximal stenosis.

The internal carotid arteries are widely patent from skull base to
carotid termini. ACAs and MCAs are patent without evidence of a
proximal branch occlusion or significant proximal stenosis. A
fenestration is incidentally noted in the anterior communicating
artery, a normal variant. No aneurysm is identified.
IMPRESSION: 1. Herniation of a small portion of the right temporal lobe into the
distal right transverse sinus.
2. Otherwise unremarkable appearance of the brain.
3. Negative head MRA.

## 2019-12-16 IMAGING — MR MR HEAD W/O CM
10 of 12 series · 31 of 48 positions shown · non-contrast
Comparison: None.

CLINICAL DATA: Right-sided pulsatile tinnitus for 3-4 months.
Thirty-two weeks pregnant.

EXAM:
MRI HEAD WITHOUT CONTRAST
MRA HEAD WITHOUT CONTRAST
TECHNIQUE: Multiplanar, multiecho pulse sequences of the brain and surrounding
structures were obtained without intravenous contrast. Angiographic
images of the head were obtained using MRA technique without
contrast.

[Series 5: T1 · sagittal · 5.0mm · 0.62mm/px · 2 of 23 slices shown (1 of 4)]
[im 1/23]
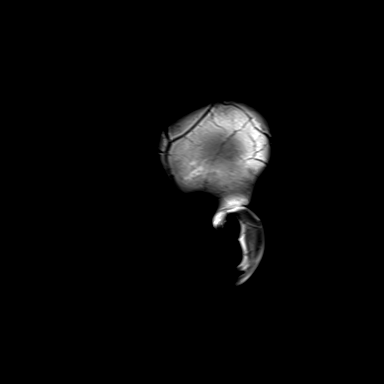
[im 23/23]
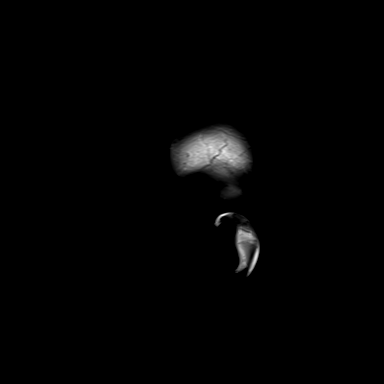

[Series 6: ax dwi_tracew · axial · 3.0mm · 0.60mm/px · z∈[-90,+65]mm · 7 of 96 slices shown]
[im 1/96]
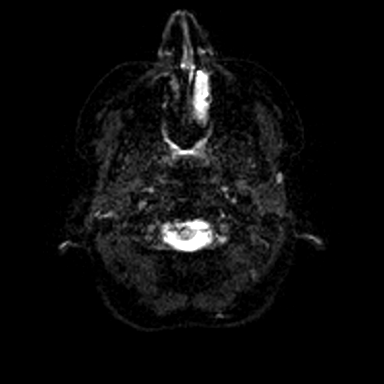
[im 16/96]
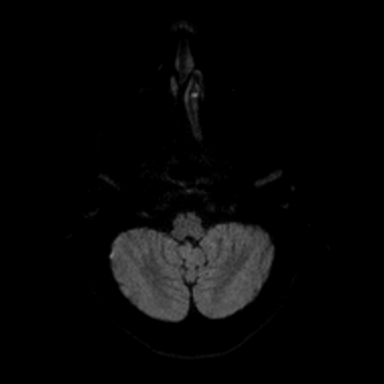
[im 32/96]
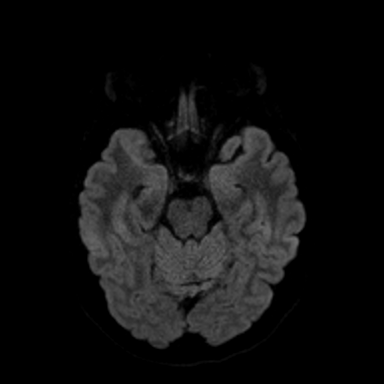
[im 48/96]
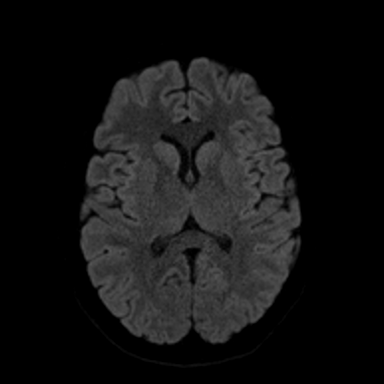
[im 64/96]
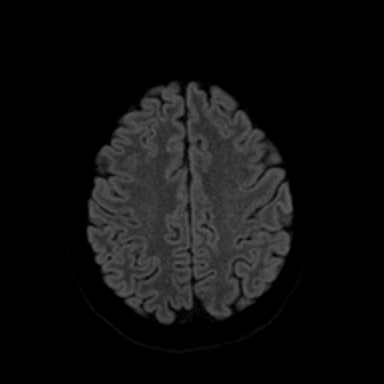
[im 80/96]
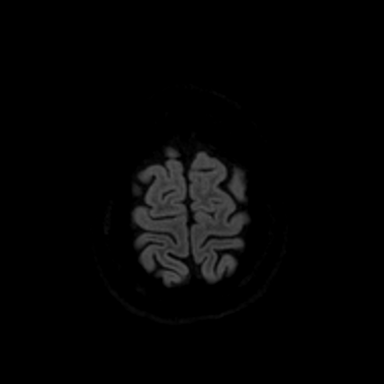
[im 96/96]
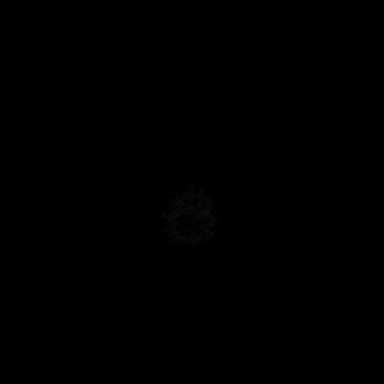

[Series 7: ax dwi_adc · axial · 3.0mm · 0.60mm/px · z∈[-90,+65]mm · 3 of 48 slices shown]
[im 1/48]
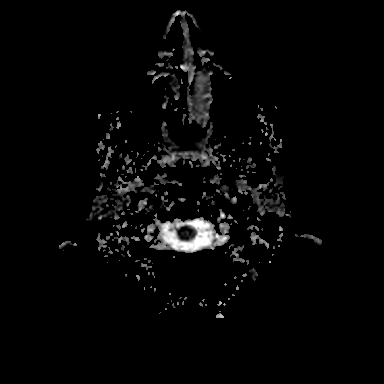
[im 24/48]
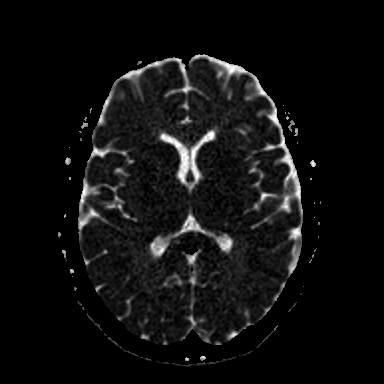
[im 48/48]
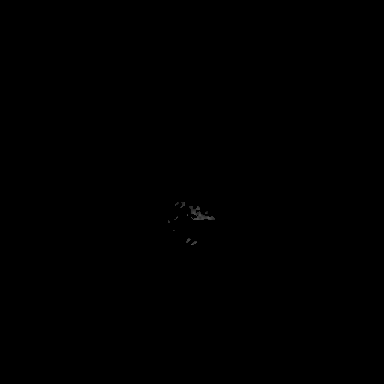

[Series 8: T2 · axial · 5.0mm · 0.53mm/px · z∈[-93,+62]mm · 2 of 27 slices shown (1 of 2)]
[im 1/27]
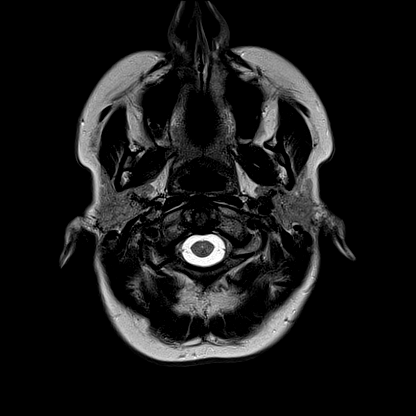
[im 27/27]
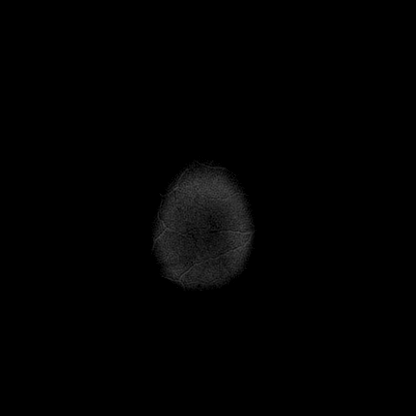

[Series 10: pha_images · axial · 3.0mm · 0.90mm/px · 1 of 59 slices shown]
[im 1/59]
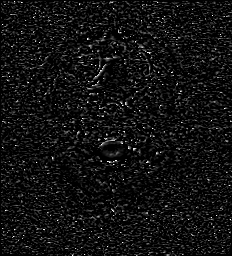

[Series 13: FLAIR · axial · 3.0mm · 0.53mm/px · z∈[-96,+65]mm · 4 of 55 slices shown]
[im 1/55]
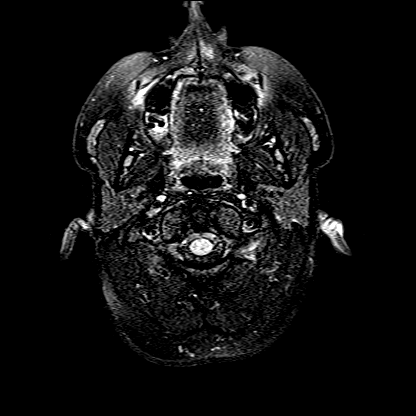
[im 19/55]
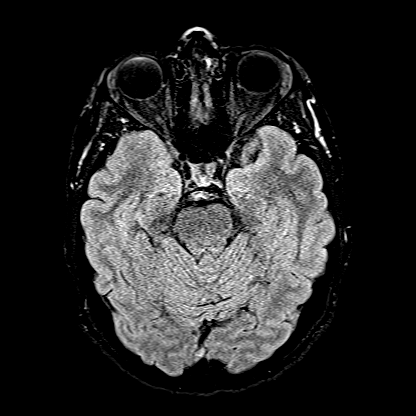
[im 37/55]
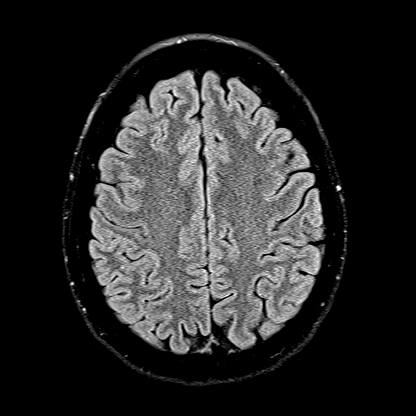
[im 55/55]
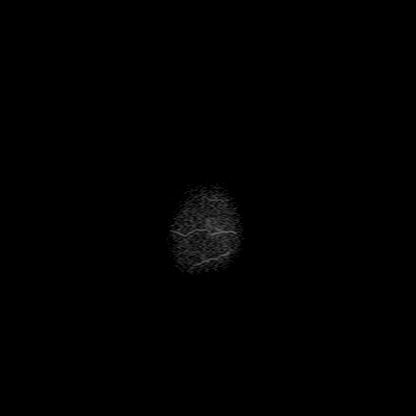

[Series 14: T1 · axial · 1.0mm · 0.98mm/px · z∈[-104,+70]mm · 8 of 176 slices shown (2 of 4)]
[im 1/176]
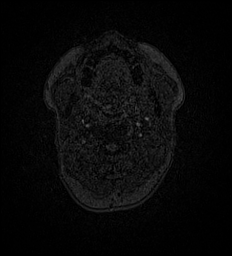
[im 30/176]
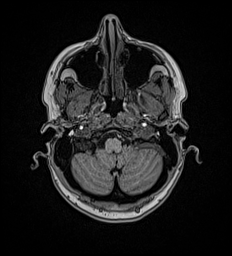
[im 59/176]
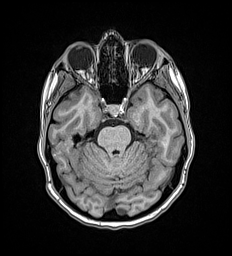
[im 73/176]
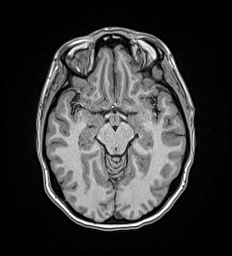
[im 103/176]
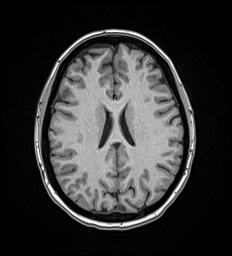
[im 117/176]
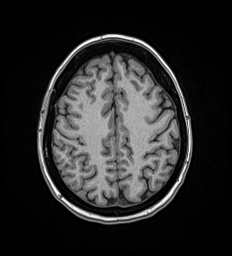
[im 146/176]
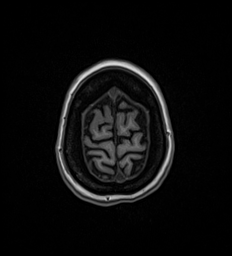
[im 176/176]
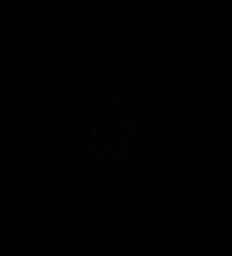

[Series 15: T2 · coronal · 5.0mm · 0.57mm/px · 2 of 31 slices shown (2 of 2)]
[im 1/31]
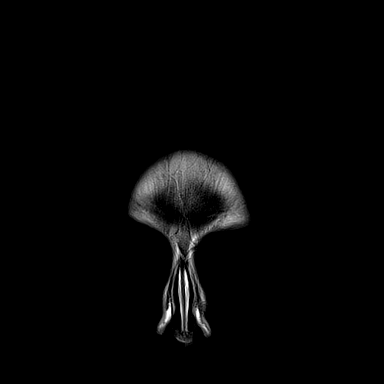
[im 31/31]
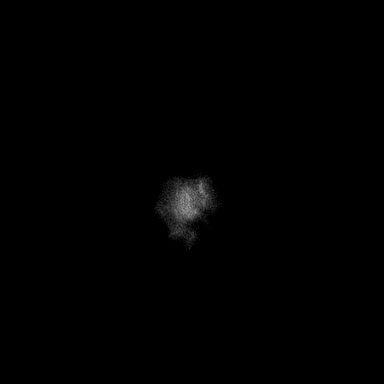

[Series 16: T1 · coronal · non-contrast · 3.0mm · 0.21mm/px · 1 of 13 slices shown (3 of 4)]
[im 1/13]
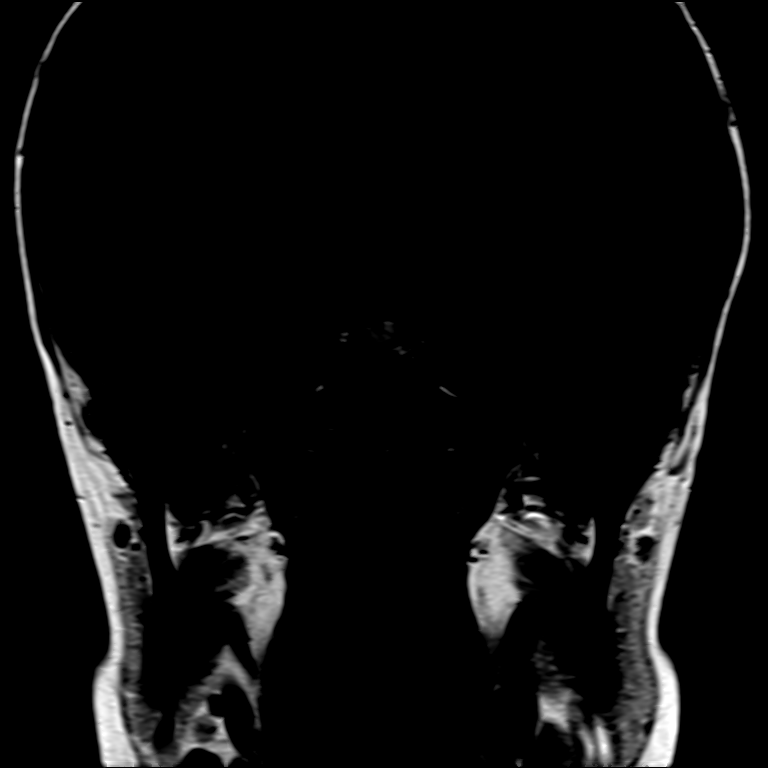

[Series 17: T1 · axial · non-contrast · 3.0mm · 0.21mm/px · 1 of 15 slices shown (4 of 4)]
[im 1/15]
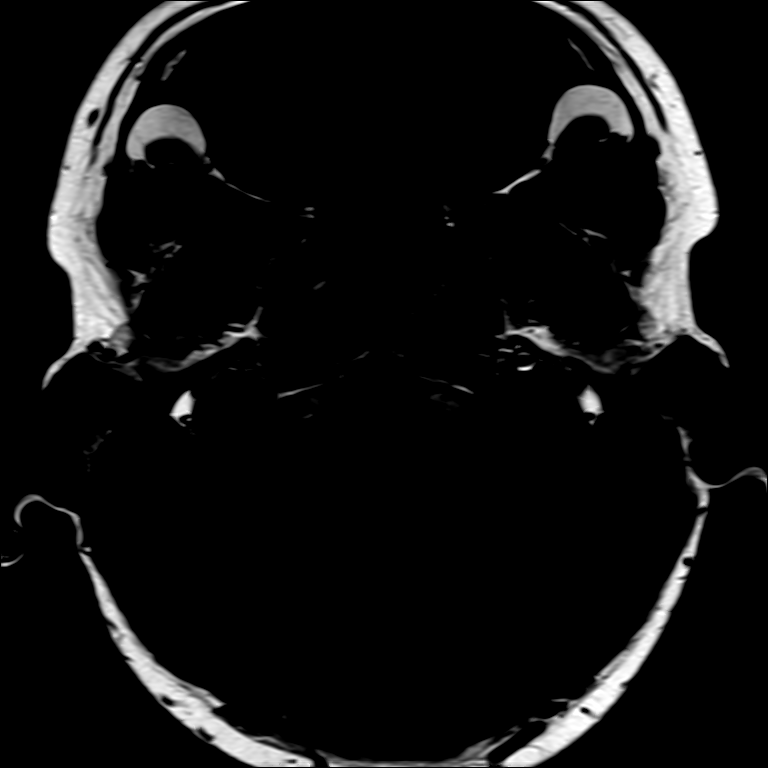

[31 of 48 positions shown; findings below may reference images not displayed]

FINDINGS: MRI HEAD FINDINGS

Brain: No acute infarct, intracranial hemorrhage, mass, midline
shift, or extra-axial fluid collection is identified. There is
herniation of a small portion of the posteroinferior right temporal
lobe into the distal right transverse sinus with suspected mild
gliosis within the herniated brain parenchyma (series 5, image 4 and
series 15, images 10 and 11). The brain is normal in signal
elsewhere. The ventricles and sulci are normal.

Dedicated imaging through the internal auditory canals demonstrates
a normal course of cranial nerves VII and VIII without evidence of a
mass on this unenhanced study. Inner ear structures demonstrate
normal signal bilaterally.

Vascular: Major intracranial vascular flow voids are preserved.

Skull and upper cervical spine: Diffusely decreased bone marrow T1
signal intensity consistent with the patient's history of anemia.

Sinuses/Orbits: Unremarkable orbits. Paranasal sinuses and mastoid
air cells are clear.

Other: None.

MRA HEAD FINDINGS

The visualized distal vertebral arteries are widely patent to the
basilar and codominant. Patent PICA, AICA, and SCA origins are
visualized bilaterally. The basilar artery is widely patent. There
may be small posterior communicating arteries bilaterally. The PCAs
are patent without evidence of a significant proximal stenosis.

The internal carotid arteries are widely patent from skull base to
carotid termini. ACAs and MCAs are patent without evidence of a
proximal branch occlusion or significant proximal stenosis. A
fenestration is incidentally noted in the anterior communicating
artery, a normal variant. No aneurysm is identified.
IMPRESSION: 1. Herniation of a small portion of the right temporal lobe into the
distal right transverse sinus.
2. Otherwise unremarkable appearance of the brain.
3. Negative head MRA.

## 2019-12-19 ENCOUNTER — Ambulatory Visit (INDEPENDENT_AMBULATORY_CARE_PROVIDER_SITE_OTHER): Payer: BC Managed Care – PPO | Admitting: Obstetrics and Gynecology

## 2019-12-19 ENCOUNTER — Encounter: Payer: Self-pay | Admitting: Obstetrics and Gynecology

## 2019-12-19 ENCOUNTER — Other Ambulatory Visit: Payer: Self-pay

## 2019-12-19 VITALS — BP 110/64 | Wt 228.0 lb

## 2019-12-19 DIAGNOSIS — Z6791 Unspecified blood type, Rh negative: Secondary | ICD-10-CM

## 2019-12-19 DIAGNOSIS — O99213 Obesity complicating pregnancy, third trimester: Secondary | ICD-10-CM

## 2019-12-19 DIAGNOSIS — O99012 Anemia complicating pregnancy, second trimester: Secondary | ICD-10-CM

## 2019-12-19 DIAGNOSIS — Z3A32 32 weeks gestation of pregnancy: Secondary | ICD-10-CM

## 2019-12-19 DIAGNOSIS — O26899 Other specified pregnancy related conditions, unspecified trimester: Secondary | ICD-10-CM

## 2019-12-19 DIAGNOSIS — O0993 Supervision of high risk pregnancy, unspecified, third trimester: Secondary | ICD-10-CM

## 2019-12-19 DIAGNOSIS — O09299 Supervision of pregnancy with other poor reproductive or obstetric history, unspecified trimester: Secondary | ICD-10-CM

## 2019-12-19 NOTE — Progress Notes (Signed)
Routine Prenatal Care Visit  Subjective  Anna Whitehead is a 34 y.o. G2P1001 at [redacted]w[redacted]d being seen today for ongoing prenatal care.  She is currently monitored for the following issues for this high-risk pregnancy and has Asthma, mild; H/O high risk medication treatment; Overweight; Anxiety and depression; History of gestational hypertension; Depression, major, recurrent, moderate (HCC); B12 deficiency; Vitamin D insufficiency; Supervision of high risk pregnancy, antepartum; Hx of preeclampsia, prior pregnancy, currently pregnant; Family history of breast cancer; Family history of pancreatic cancer; Obesity affecting pregnancy; Rh negative state in antepartum period; and Anemia during pregnancy in second trimester on their problem list.  ----------------------------------------------------------------------------------- Patient reports still with some dizzyness.  Abnormal MRI of uncertain significance. Managed by ENT..   Contractions: Not present. Vag. Bleeding: None.  Movement: Present. Leaking Fluid denies.  ----------------------------------------------------------------------------------- The following portions of the patient's history were reviewed and updated as appropriate: allergies, current medications, past family history, past medical history, past social history, past surgical history and problem list. Problem list updated.  Objective  Blood pressure 110/64, weight 228 lb (103.4 kg), last menstrual period 05/07/2019. Pregravid weight 240 lb (108.9 kg) Total Weight Gain -12 lb (-5.443 kg) Urinalysis: Urine Protein    Urine Glucose    Fetal Status: Fetal Heart Rate (bpm): 140 Fundal Height: 32 cm Movement: Present     General:  Alert, oriented and cooperative. Patient is in no acute distress.  Skin: Skin is warm and dry. No rash noted.   Cardiovascular: Normal heart rate noted  Respiratory: Normal respiratory effort, no problems with respiration noted  Abdomen: Soft, gravid,  appropriate for gestational age. Pain/Pressure: Absent     Pelvic:  Cervical exam deferred        Extremities: Normal range of motion.  Edema: None  Mental Status: Normal mood and affect. Normal behavior. Normal judgment and thought content.   Assessment   34 y.o. G2P1001 at [redacted]w[redacted]d by  02/11/2020, by Last Menstrual Period presenting for routine prenatal visit  Plan   Pregnancy#2 Problems (from 05/07/19 to present)    Problem Noted Resolved   Obesity affecting pregnancy 08/29/2019 by Conard Novak, MD No   Rh negative state in antepartum period 08/29/2019 by Conard Novak, MD No   Overview Signed 11/25/2019  3:44 PM by Mirna Mires, CNM    Rhogam 9/10      Supervision of high risk pregnancy, antepartum 06/16/2019 by Vena Austria, MD No   Overview Addendum 11/25/2019  3:43 PM by Mirna Mires, CNM     Nursing Staff Provider  Office Location  [facility] Dating   LMP = 6 wk [place]  Language  English Anatomy US    Flu Vaccine   Genetic Screen  NIPS:   AFP:   First Screen:  Declined MaternT  TDaP vaccine    Early GTT135 Early :9/10 Third trimester :   Rhogam     LAB RESULTS   Feeding Plan  Blood Type O/Negative/-- (04/05 1019)   Contraception  Antibody Negative (04/05 1019)  Circumcision  Rubella 1.89 (04/05 1019)  Pediatrician   RPR Non Reactive (04/05 1019)   Support Person Para March FOB HBsAg Negative (04/05 1019)   Prenatal Classes  HIV Non Reactive (04/05 1019)    Varicella  Immune  BTL Consent  GBS  (For PCN allergy, check sensitivities)        VBAC Consent  Pap      Hgb Electro      CF      SMA  Previous Version       Preterm labor symptoms and general obstetric precautions including but not limited to vaginal bleeding, contractions, leaking of fluid and fetal movement were reviewed in detail with the patient. Please refer to After Visit Summary for other counseling recommendations.   Return in about 2 weeks (around 01/02/2020) for Routine  Prenatal Appointment.  [name], MD, [facility], [facility] 12/19/2019 4:55 PM

## 2019-12-21 ENCOUNTER — Other Ambulatory Visit: Payer: Self-pay | Admitting: Neurosurgery

## 2019-12-21 DIAGNOSIS — G08 Intracranial and intraspinal phlebitis and thrombophlebitis: Secondary | ICD-10-CM

## 2019-12-23 ENCOUNTER — Ambulatory Visit
Admission: RE | Admit: 2019-12-23 | Discharge: 2019-12-23 | Disposition: A | Discharge status: Home / Self Care | Payer: BC Managed Care – PPO | Source: Ambulatory Visit | Attending: Neurosurgery | Admitting: Neurosurgery

## 2019-12-23 ENCOUNTER — Other Ambulatory Visit: Payer: Self-pay | Admitting: Neurosurgery

## 2019-12-23 ENCOUNTER — Other Ambulatory Visit: Payer: Self-pay

## 2019-12-23 DIAGNOSIS — G08 Intracranial and intraspinal phlebitis and thrombophlebitis: Secondary | ICD-10-CM | POA: Diagnosis not present

## 2019-12-23 IMAGING — MR MR MRV HEAD W/O CM
1 series · 48 of 48 positions shown · non-contrast
Comparison: MRI [DATE]

CLINICAL DATA: Pregnant patient abnormal sound in the right ear

EXAM:
MR VENOGRAM OF THE HEAD WITHOUT CONTRAST
TECHNIQUE: Angiographic images of the intracranial venous structures were
obtained using MRV technique without intravenous contrast.

[Series 5: TOF · coronal · 2.5mm · 0.98mm/px · 48 of 128 slices shown]
[im 1/128]
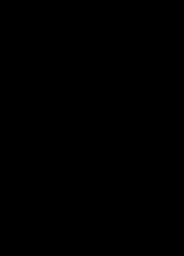
[im 3/128]
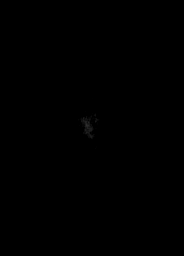
[im 6/128]
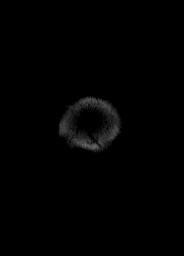
[im 9/128]
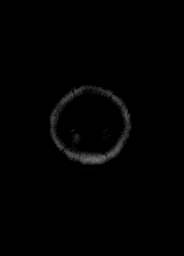
[im 11/128]
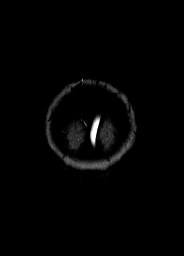
[im 14/128]
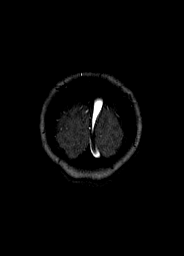
[im 17/128]
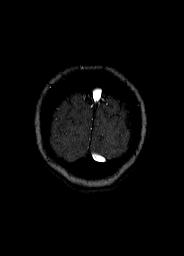
[im 19/128]
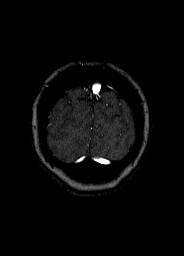
[im 22/128]
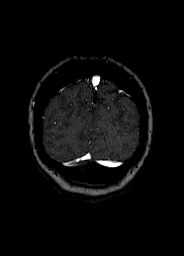
[im 25/128]
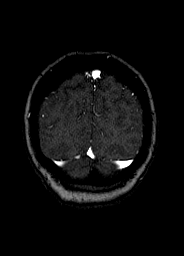
[im 28/128]
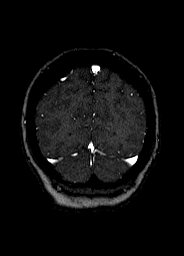
[im 30/128]
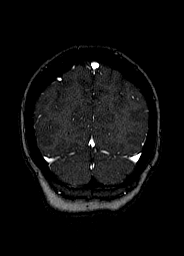
[im 33/128]
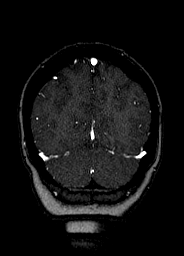
[im 36/128]
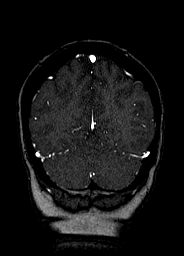
[im 38/128]
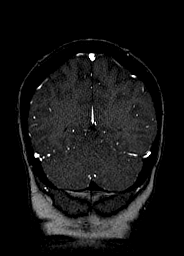
[im 41/128]
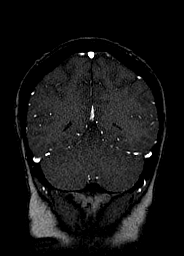
[im 44/128]
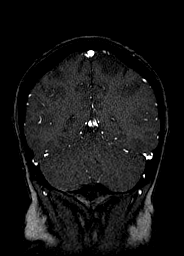
[im 46/128]
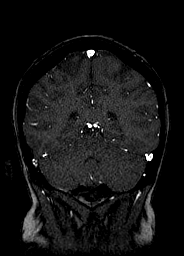
[im 49/128]
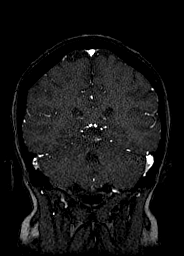
[im 52/128]
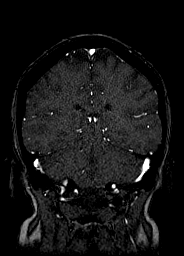
[im 55/128]
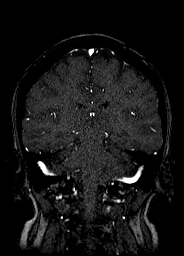
[im 57/128]
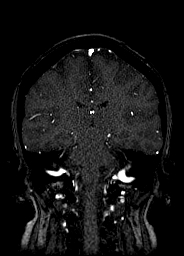
[im 60/128]
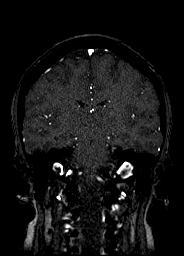
[im 63/128]
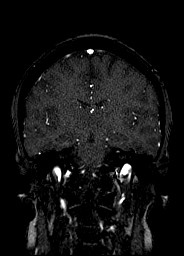
[im 65/128]
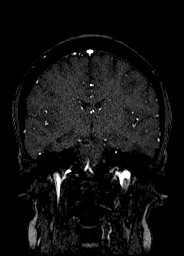
[im 68/128]
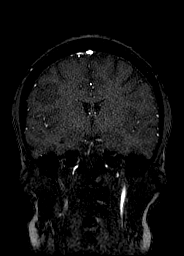
[im 71/128]
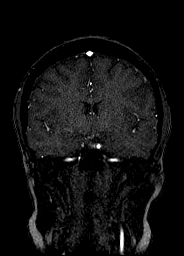
[im 73/128]
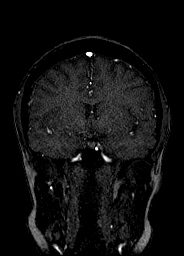
[im 76/128]
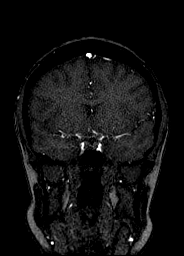
[im 79/128]
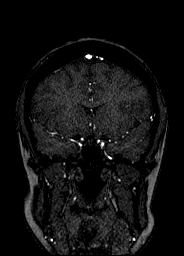
[im 82/128]
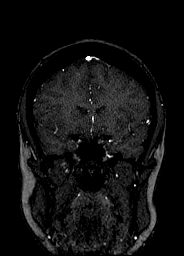
[im 84/128]
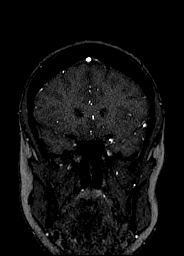
[im 87/128]
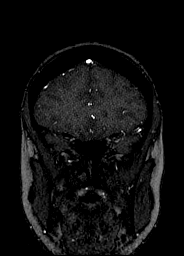
[im 90/128]
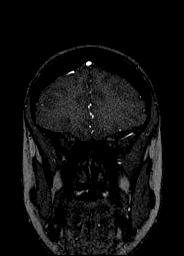
[im 92/128]
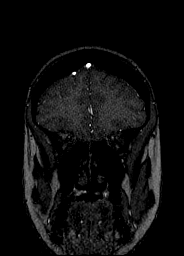
[im 95/128]
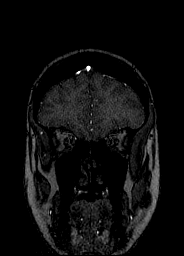
[im 98/128]
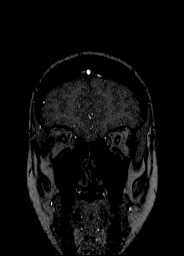
[im 100/128]
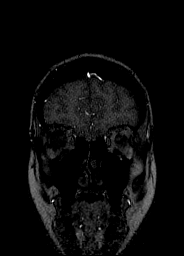
[im 103/128]
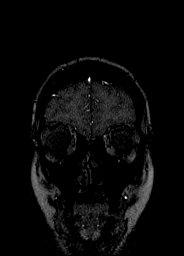
[im 106/128]
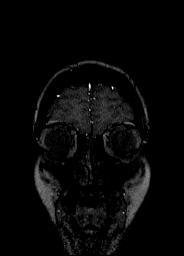
[im 109/128]
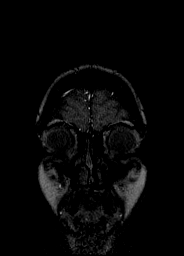
[im 111/128]
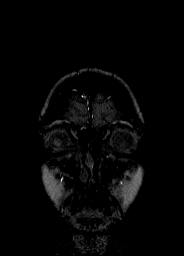
[im 114/128]
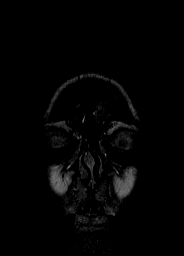
[im 117/128]
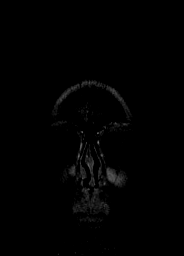
[im 119/128]
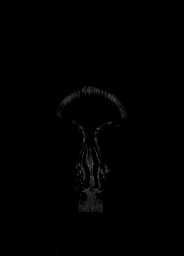
[im 122/128]
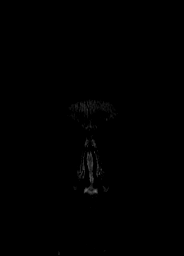
[im 125/128]
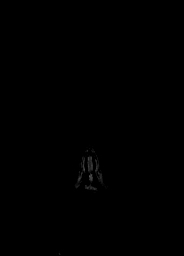
[im 128/128]
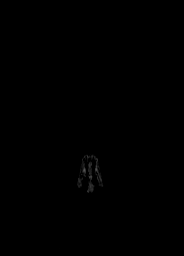

[48 of 48 positions shown; findings below may reference images not displayed]

FINDINGS: Deep venous system is patent. Superior sagittal sinus is patent.
Both transverse sinuses are patent with the left being dominant.
There is mild narrowing of the distal right transverse sinus just
proximal to the sigmoid sinus at the site of the previous described
temporal lobe encroachment. No sign of thrombus. I would be
surprised if this was symptomatic. Both jugular veins show flow.
IMPRESSION: No evidence of venous thrombosis. Mild narrowing of the distal right
transverse sinus just proximal to the sigmoid sinus at the site of
the previously described temporal lobe encroachment. This is the non
dominant side and I would not expect this to be symptomatic.

## 2020-01-02 ENCOUNTER — Other Ambulatory Visit: Payer: Self-pay

## 2020-01-02 ENCOUNTER — Ambulatory Visit (INDEPENDENT_AMBULATORY_CARE_PROVIDER_SITE_OTHER): Payer: BC Managed Care – PPO | Admitting: Obstetrics

## 2020-01-02 VITALS — BP 100/70 | Wt 224.0 lb

## 2020-01-02 DIAGNOSIS — O0993 Supervision of high risk pregnancy, unspecified, third trimester: Secondary | ICD-10-CM

## 2020-01-02 DIAGNOSIS — Z3A34 34 weeks gestation of pregnancy: Secondary | ICD-10-CM

## 2020-01-02 DIAGNOSIS — F419 Anxiety disorder, unspecified: Secondary | ICD-10-CM

## 2020-01-02 DIAGNOSIS — F32A Depression, unspecified: Secondary | ICD-10-CM

## 2020-01-02 LAB — POCT URINALYSIS DIPSTICK OB: Glucose, UA: NEGATIVE

## 2020-01-02 MED ORDER — ESCITALOPRAM OXALATE 10 MG PO TABS: 10.0000 mg | ORAL_TABLET | Freq: Every day | ORAL | 2 refills | Status: DC

## 2020-01-02 NOTE — Progress Notes (Signed)
C/O still n/v, dizziness, tired, weak, more FM more painfull, feels pressure.rj

## 2020-01-02 NOTE — Progress Notes (Signed)
Routine Prenatal Care Visit  Subjective  Anna Whitehead is a 34 y.o. G2P1001 at [redacted]w[redacted]d being seen today for ongoing prenatal care.  She is currently monitored for the following issues for this high-risk pregnancy and has Asthma, mild; H/O high risk medication treatment; Overweight; Anxiety and depression; History of gestational hypertension; Depression, major, recurrent, moderate (HCC); B12 deficiency; Vitamin D insufficiency; Supervision of high risk pregnancy, antepartum; Hx of preeclampsia, prior pregnancy, currently pregnant; Family history of breast cancer; Family history of pancreatic cancer; Obesity affecting pregnancy; Rh negative state in antepartum period; and Anemia during pregnancy in second trimester on their problem list.  ----------------------------------------------------------------------------------- Patient reports nausea, no bleeding, no contractions, no cramping and and she has seen a neurologist recently and has an unusaul finding- small section of the brain is extended toward a vessel..    .  .   . Leaking Fluid denies.  ----------------------------------------------------------------------------------- The following portions of the patient's history were reviewed and updated as appropriate: allergies, current medications, past family history, past medical history, past social history, past surgical history and problem list. Problem list updated.  Objective  Last menstrual period 05/07/2019. Pregravid weight 240 lb (108.9 kg) Total Weight Gain -12 lb (-5.443 kg) Urinalysis: Urine Protein    Urine Glucose    Fetal Status:           General:  Alert, oriented and cooperative. Patient is in no acute distress.  Skin: Skin is warm and dry. No rash noted.   Cardiovascular: Normal heart rate noted  Respiratory: Normal respiratory effort, no problems with respiration noted  Abdomen: Soft, gravid, appropriate for gestational age.       Pelvic:  Cervical exam deferred          Extremities: Normal range of motion.     Mental Status: Normal mood and affect. Normal behavior. Normal judgment and thought content.  She has also felt like her anxiety has really flared. She also has questions about her recent neuro findings Assessment   34 y.o. G2P1001 at [redacted]w[redacted]d by  02/11/2020, by Last Menstrual Period presenting for routine prenatal visit Anxiety flare Neuro findings- pt has questions.  Plan   Pregnancy#2 Problems (from 05/07/19 to present)    Problem Noted Resolved   Obesity affecting pregnancy 08/29/2019 by Conard Novak, MD No   Rh negative state in antepartum period 08/29/2019 by Conard Novak, MD No   Overview Signed 11/25/2019  3:44 PM by Mirna Mires, CNM    Rhogam 9/10      Supervision of high risk pregnancy, antepartum 06/16/2019 by Vena Austria, MD No   Overview Addendum 11/25/2019  3:43 PM by Mirna Mires, CNM     Nursing Staff Provider  Office Location  [facility] Dating   LMP = 6 wk [place]  Language  English Anatomy US    Flu Vaccine   Genetic Screen  NIPS:   AFP:   First Screen:  Declined MaternT  TDaP vaccine    Early GTT135 Early :9/10 Third trimester :   Rhogam     LAB RESULTS   Feeding Plan  Blood Type O/Negative/-- (04/05 1019)   Contraception  Antibody Negative (04/05 1019)  Circumcision  Rubella 1.89 (04/05 1019)  Pediatrician   RPR Non Reactive (04/05 1019)   Support Person Para March FOB HBsAg Negative (04/05 1019)   Prenatal Classes  HIV Non Reactive (04/05 1019)    Varicella  Immune  BTL Consent  GBS  (For PCN allergy, check sensitivities)  VBAC Consent  Pap      Hgb Electro      CF      SMA               Previous Version       Term labor symptoms and general obstetric precautions including but not limited to vaginal bleeding, contractions, leaking of fluid and fetal movement were reviewed in detail with the patient. Please refer to After Visit Summary for other counseling recommendations.  Discussed  starting her on Lexapro. SSRIs discussed, including side effects, benefits. Will start Lexapro trial at 10 mg po q HS. Will follow up on medication at next visit. Growth scan in two weeks ( obesity). Discussed the recent Neuro visit and findings-Pt is to call the neuro doctor with q's regarding getting an epidural. Will discuss next visit. Return in about 2 weeks (around 01/16/2020) for return OB.  Mirna Mires, CNM  01/02/2020 4:58 PM

## 2020-01-04 ENCOUNTER — Telehealth: Payer: Self-pay | Admitting: Oncology

## 2020-01-04 DIAGNOSIS — O99012 Anemia complicating pregnancy, second trimester: Secondary | ICD-10-CM

## 2020-01-04 NOTE — Telephone Encounter (Signed)
Done  Pt has been scheduled as requested 11/2 and 11/3 dates per pt request.

## 2020-01-04 NOTE — Telephone Encounter (Signed)
New patient appt on 10/03/19 and did get the Venofer weekly x4 but cancelled f/u appts.  What should we schedule for her?

## 2020-01-04 NOTE — Telephone Encounter (Signed)
Please schedule as MD suggest and inform patient of appt details.

## 2020-01-04 NOTE — Telephone Encounter (Signed)
Lab (cbc iron tibc ferritin) prior MD +/- venofer when we have capacity to do the next venofer.

## 2020-01-12 ENCOUNTER — Encounter: Payer: Self-pay | Admitting: Advanced Practice Midwife

## 2020-01-12 ENCOUNTER — Other Ambulatory Visit: Payer: Self-pay

## 2020-01-12 ENCOUNTER — Telehealth: Payer: Self-pay

## 2020-01-12 ENCOUNTER — Ambulatory Visit (INDEPENDENT_AMBULATORY_CARE_PROVIDER_SITE_OTHER): Payer: BC Managed Care – PPO | Admitting: Advanced Practice Midwife

## 2020-01-12 VITALS — BP 113/68 | HR 54 | Wt 224.0 lb

## 2020-01-12 DIAGNOSIS — Z3A35 35 weeks gestation of pregnancy: Secondary | ICD-10-CM

## 2020-01-12 DIAGNOSIS — Z8759 Personal history of other complications of pregnancy, childbirth and the puerperium: Secondary | ICD-10-CM

## 2020-01-12 DIAGNOSIS — O0993 Supervision of high risk pregnancy, unspecified, third trimester: Secondary | ICD-10-CM

## 2020-01-12 NOTE — Progress Notes (Signed)
contractions, constant pressure/tightness, headache, no VB, states lof started 01/10/20

## 2020-01-12 NOTE — Progress Notes (Signed)
Routine Prenatal Care Visit  Subjective  Anna Whitehead is a 34 y.o. G2P1001 at [redacted]w[redacted]d being seen today for ongoing prenatal care.  She is currently monitored for the following issues for this high-risk pregnancy and has Asthma, mild; H/O high risk medication treatment; Overweight; Anxiety and depression; History of gestational hypertension; Depression, major, recurrent, moderate (HCC); B12 deficiency; Vitamin D insufficiency; Supervision of high risk pregnancy, antepartum; Hx of preeclampsia, prior pregnancy, currently pregnant; Family history of breast cancer; Family history of pancreatic cancer; Obesity affecting pregnancy; Rh negative state in antepartum period; and Anemia during pregnancy in second trimester on their problem list.  ----------------------------------------------------------------------------------- Patient reports constant pain and pressure in abdomen and decreased fetal movement in the past couple days. She has noticed an increase of vaginal fluid and has not worn a pad. She had a headache recently that was relieved by tylenol.   Contractions: Irritability. Vag. Bleeding: None.  Movement: (!) Decreased. Leaking Fluid admits to.  ----------------------------------------------------------------------------------- The following portions of the patient's history were reviewed and updated as appropriate: allergies, current medications, past family history, past medical history, past social history, past surgical history and problem list. Problem list updated.  Objective  Blood pressure 113/68, pulse (!) 54, weight 224 lb (101.6 kg), last menstrual period 05/07/2019. Pregravid weight 240 lb (108.9 kg) Total Weight Gain -16 lb (-7.258 kg) Urinalysis: Urine Protein    Urine Glucose    Fetal Status: Fetal Heart Rate (bpm): 122 with acceleration to 150s   Movement: (!) Decreased     General:  Alert, oriented and cooperative. Patient is in no acute distress.  Skin: Skin is warm and  dry. No rash noted.   Cardiovascular: Normal heart rate noted  Respiratory: Normal respiratory effort, no problems with respiration noted  Abdomen: Soft, gravid, appropriate for gestational age. Pain/Pressure: Present     Pelvic:  thin white discharge, negative nitrazine, negative fern        Extremities: Normal range of motion.  Edema: None  Mental Status: Normal mood and affect. Normal behavior. Normal judgment and thought content.   Assessment   34 y.o. G2P1001 at [redacted]w[redacted]d by  02/11/2020, by Last Menstrual Period presenting for work-in prenatal visit  Plan   Pregnancy#2 Problems (from 05/07/19 to present)    Problem Noted Resolved   Obesity affecting pregnancy 08/29/2019 by Conard Novak, MD No   Rh negative state in antepartum period 08/29/2019 by Conard Novak, MD No   Overview Signed 11/25/2019  3:44 PM by Mirna Mires, CNM    Rhogam 9/10      Supervision of high risk pregnancy, antepartum 06/16/2019 by Vena Austria, MD No   Overview Addendum 11/25/2019  3:43 PM by Mirna Mires, CNM     Nursing Staff Provider  Office Location  [facility] Dating   LMP = 6 wk [place]  Language  English Anatomy US    Flu Vaccine   Genetic Screen  NIPS:   AFP:   First Screen:  Declined MaternT  TDaP vaccine    Early GTT135 Early :9/10 Third trimester :   Rhogam     LAB RESULTS   Feeding Plan  Blood Type O/Negative/-- (04/05 1019)   Contraception  Antibody Negative (04/05 1019)  Circumcision  Rubella 1.89 (04/05 1019)  Pediatrician   RPR Non Reactive (04/05 1019)   Support Person Duncan FOB HBsAg Negative (04/05 1019)   Prenatal Classes  HIV Non Reactive (04/05 1019)    Varicella  Immune  BTL Consent  GBS  (For PCN allergy, check sensitivities)        VBAC Consent  Pap      Hgb Electro      CF      SMA               Previous Version       Preterm labor symptoms and general obstetric precautions including but not limited to vaginal bleeding, contractions, leaking of  fluid and fetal movement were reviewed in detail with the patient.   Return for scheduled follow up.  Tresea Mall, CNM 01/12/2020 11:29 AM

## 2020-01-12 NOTE — Telephone Encounter (Signed)
Pt calling; is 35 wks; having ctxs more often; last day or so has been really tight; decreased FM; getting uncomfortable, having leakage - more d/c than normal.  [phone]  Pt ctxs are irreg; has a constant tightness; felt baby move just not as much as usual; has had problem with leaking urine off and on this preg.  Sched c JEG in [place] 10:50am today.

## 2020-01-16 ENCOUNTER — Encounter: Payer: Self-pay | Admitting: Obstetrics & Gynecology

## 2020-01-16 ENCOUNTER — Ambulatory Visit (INDEPENDENT_AMBULATORY_CARE_PROVIDER_SITE_OTHER): Payer: BC Managed Care – PPO

## 2020-01-16 ENCOUNTER — Ambulatory Visit (INDEPENDENT_AMBULATORY_CARE_PROVIDER_SITE_OTHER): Payer: BC Managed Care – PPO | Admitting: Obstetrics & Gynecology

## 2020-01-16 ENCOUNTER — Inpatient Hospital Stay: Payer: BC Managed Care – PPO | Attending: Oncology

## 2020-01-16 ENCOUNTER — Other Ambulatory Visit: Payer: Self-pay

## 2020-01-16 VITALS — BP 120/80 | Wt 225.0 lb

## 2020-01-16 DIAGNOSIS — O0993 Supervision of high risk pregnancy, unspecified, third trimester: Secondary | ICD-10-CM

## 2020-01-16 DIAGNOSIS — Z3A36 36 weeks gestation of pregnancy: Secondary | ICD-10-CM

## 2020-01-16 DIAGNOSIS — R5383 Other fatigue: Secondary | ICD-10-CM | POA: Insufficient documentation

## 2020-01-16 DIAGNOSIS — O99012 Anemia complicating pregnancy, second trimester: Secondary | ICD-10-CM | POA: Insufficient documentation

## 2020-01-16 DIAGNOSIS — D508 Other iron deficiency anemias: Secondary | ICD-10-CM | POA: Insufficient documentation

## 2020-01-16 DIAGNOSIS — Z112 Encounter for screening for other bacterial diseases: Secondary | ICD-10-CM

## 2020-01-16 DIAGNOSIS — O99213 Obesity complicating pregnancy, third trimester: Secondary | ICD-10-CM

## 2020-01-16 DIAGNOSIS — Z803 Family history of malignant neoplasm of breast: Secondary | ICD-10-CM | POA: Diagnosis not present

## 2020-01-16 DIAGNOSIS — O0990 Supervision of high risk pregnancy, unspecified, unspecified trimester: Secondary | ICD-10-CM

## 2020-01-16 DIAGNOSIS — Z79899 Other long term (current) drug therapy: Secondary | ICD-10-CM | POA: Diagnosis not present

## 2020-01-16 DIAGNOSIS — Z3A34 34 weeks gestation of pregnancy: Secondary | ICD-10-CM

## 2020-01-16 DIAGNOSIS — Z8759 Personal history of other complications of pregnancy, childbirth and the puerperium: Secondary | ICD-10-CM

## 2020-01-16 DIAGNOSIS — O09299 Supervision of pregnancy with other poor reproductive or obstetric history, unspecified trimester: Secondary | ICD-10-CM

## 2020-01-16 LAB — CBC WITH DIFFERENTIAL/PLATELET
Abs Immature Granulocytes: 0.04 10*3/uL (ref 0.00–0.07)
Basophils Absolute: 0 10*3/uL (ref 0.0–0.1)
Basophils Relative: 0 %
Eosinophils Absolute: 0.2 10*3/uL (ref 0.0–0.5)
Eosinophils Relative: 3 %
HCT: 34.9 % — ABNORMAL LOW (ref 36.0–46.0)
Hemoglobin: 11.3 g/dL — ABNORMAL LOW (ref 12.0–15.0)
Immature Granulocytes: 1 %
Lymphocytes Relative: 21 %
Lymphs Abs: 1.5 10*3/uL (ref 0.7–4.0)
MCH: 29.5 pg (ref 26.0–34.0)
MCHC: 32.4 g/dL (ref 30.0–36.0)
MCV: 91.1 fL (ref 80.0–100.0)
Monocytes Absolute: 0.4 10*3/uL (ref 0.1–1.0)
Monocytes Relative: 6 %
Neutro Abs: 5 10*3/uL (ref 1.7–7.7)
Neutrophils Relative %: 69 %
Platelets: 190 10*3/uL (ref 150–400)
RBC: 3.83 MIL/uL — ABNORMAL LOW (ref 3.87–5.11)
RDW: 16.3 % — ABNORMAL HIGH (ref 11.5–15.5)
WBC: 7.2 10*3/uL (ref 4.0–10.5)
nRBC: 0 % (ref 0.0–0.2)

## 2020-01-16 LAB — POCT URINALYSIS DIPSTICK OB: Glucose, UA: NEGATIVE

## 2020-01-16 LAB — FERRITIN: Ferritin: 5 ng/mL — ABNORMAL LOW (ref 11–307)

## 2020-01-16 LAB — IRON AND TIBC
Iron: 70 ug/dL (ref 28–170)
Saturation Ratios: 11 % (ref 10.4–31.8)
TIBC: 641 ug/dL — ABNORMAL HIGH (ref 250–450)
UIBC: 571 ug/dL

## 2020-01-16 NOTE — Progress Notes (Signed)
  Subjective  Fetal Movement? yes Contractions? no Leaking Fluid? no Vaginal Bleeding? no  Objective  BP 120/80   Wt 225 lb (102.1 kg)   LMP 05/07/2019 (Exact Date)   BMI 32.28 kg/m  General: NAD Pumonary: no increased work of breathing Abdomen: gravid, non-tender Extremities: no edema Psychiatric: mood appropriate, affect full SVE 1/50/-3  Review of ULTRASOUND.    I have personally reviewed images and report of recent ultrasound [facility].    Plan of management to be discussed with patient. EFW 50%, AFI 8, Vtx    Assessment  34 y.o. G2P1001 at [redacted]w[redacted]d by  02/11/2020, by Last Menstrual Period presenting for routine prenatal visit  Plan   Problem List Items Addressed This Visit      Other   History of gestational hypertension   Supervision of high risk pregnancy, antepartum   Hx of preeclampsia, prior pregnancy, currently pregnant   Obesity affecting pregnancy    Other Visit Diagnoses    [redacted] weeks gestation of pregnancy    -  Primary   Relevant Orders   POC Urinalysis Dipstick OB (Completed)   Supervision of high risk pregnancy in third trimester       Screening for streptococcal infection       Relevant Orders   Culture, beta strep (group b only)      Pregnancy#2 Problems (from 05/07/19 to present)    Problem Noted Resolved   Obesity affecting pregnancy 08/29/2019 by Conard Novak, MD No   Rh negative state in antepartum period 08/29/2019 by Conard Novak, MD No   Overview Signed 11/25/2019  3:44 PM by Mirna Mires, CNM    Rhogam 9/10      Supervision of high risk pregnancy, antepartum 06/16/2019 by Vena Austria, MD No   Overview Addendum 01/16/2020  4:18 PM by Nadara Mustard, MD     Nursing Staff Provider  Office Location  [facility] Dating   LMP = 6 wk [place]  Language  English Anatomy US    Flu Vaccine  10/21 Genetic Screen  NIPS:   AFP:   First Screen:  Declined MaternT  TDaP vaccine   10/21 Early GTT135 Early :9/10 Third trimester :    Rhogam  28 weeks   LAB RESULTS   Feeding Plan Breast Blood Type O/Negative/-- (04/05 1019)   Contraception IUD Antibody Negative (04/05 1019)  Circumcision  Rubella 1.89 (04/05 1019)  Pediatrician   RPR Non Reactive (04/05 1019)   Support Person Para March FOB HBsAg Negative (04/05 1019)   Prenatal Classes  HIV Non Reactive (04/05 1019)    Varicella  Immune  BTL Consent  GBS  (For PCN allergy, check sensitivities)        VBAC Consent n/a Pap      Hgb Electro      CF      SMA              Discussed delivery planning, IOL, Epidural Plans to breast feed Plans IUD for PP contraception and it has helped well w endometriosis mgt as well Immunizations UTD  Annamarie Major, MD, [facility], [facility] 01/16/2020  4:20 PM

## 2020-01-16 NOTE — Patient Instructions (Signed)
Group B Streptococcus Infection During Pregnancy °Group B Streptococcus (GBS) is a type of bacteria that is often found in healthy people. It is commonly found in the rectum, vagina, and intestines. In people who are healthy and not pregnant, the bacteria rarely cause serious illness or complications. However, women who test positive for GBS during pregnancy can pass the bacteria to the baby during childbirth. This can cause serious infection in the baby after birth. °Women with GBS may also have infections during their pregnancy or soon after childbirth. The infections include urinary tract infections (UTIs) or infections of the uterus. GBS also increases a woman's risk of complications during pregnancy, such as early labor or delivery, miscarriage, or stillbirth. Routine testing for GBS is recommended for all pregnant women. °What are the causes? °This condition is caused by bacteria called Streptococcus agalactiae. °What increases the risk? °You may have a higher risk for GBS infection during pregnancy if you had one during a past pregnancy. °What are the signs or symptoms? °In most cases, GBS infection does not cause symptoms in pregnant women. If symptoms exist, they may include: °· Labor that starts before the 37th week of pregnancy. °· A UTI or bladder infection. This may cause a fever, frequent urination, or pain and burning during urination. °· Fever during labor. There can also be a rapid heartbeat in the mother or baby. °Rare but serious symptoms of a GBS infection in women include: °· Blood infection (septicemia). This may cause fever, chills, or confusion. °· Lung infection (pneumonia). This may cause fever, chills, cough, rapid breathing, chest pain, or difficulty breathing. °· Bone, joint, skin, or soft tissue infection. °How is this diagnosed? °You may be screened for GBS between week 35 and week 37 of pregnancy. If you have symptoms of preterm labor, you may be screened earlier. This condition is  diagnosed based on lab test results from: °· A swab of fluid from the vagina and rectum. °· A urine sample. °How is this treated? °This condition is treated with antibiotic medicine. Antibiotic medicine may be given: °· To you when you go into labor, or as soon as your water breaks. The medicines will continue until after you give birth. If you are having a cesarean delivery, you do not need antibiotics unless your water has broken. °· To your baby, if he or she requires treatment. Your health care provider will check your baby to decide if he or she needs antibiotics to prevent a serious infection. °Follow these instructions at home: °· Take over-the-counter and prescription medicines only as told by your health care provider. °· Take your antibiotic medicine as told by your health care provider. Do not stop taking the antibiotic even if you start to feel better. °· Keep all pre-birth (prenatal) visits and follow-up visits as told by your health care provider. This is important. °Contact a health care provider if: °· You have pain or burning when you urinate. °· You have to urinate more often than usual. °· You have a fever or chills. °· You develop a bad-smelling vaginal discharge. °Get help right away if: °· Your water breaks. °· You go into labor. °· You have severe pain in your abdomen. °· You have difficulty breathing. °· You have chest pain. °These symptoms may represent a serious problem that is an emergency. Do not wait to see if the symptoms will go away. Get medical help right away. Call your local emergency services (911 in the [place] Do not drive yourself to   the hospital. °Summary °· GBS is a type of bacteria that is common in healthy people. °· During pregnancy, colonization with GBS can cause serious complications for you or your baby. °· Your health care provider will screen you between 35 and 37 weeks of pregnancy to determine if you are colonized with GBS. °· If you are colonized with GBS during  pregnancy, your health care provider will recommend antibiotics through an IV during labor. °· After delivery, your baby will be evaluated for complications related to potential GBS infection and may require antibiotics to prevent a serious infection. °This information is not intended to replace advice given to you by your health care provider. Make sure you discuss any questions you have with your health care provider. °Document Revised: 09/27/2018 Document Reviewed: 09/27/2018 °Elsevier Patient Education © 2020 [employer] ° °

## 2020-01-17 ENCOUNTER — Encounter: Payer: Self-pay | Admitting: Oncology

## 2020-01-17 ENCOUNTER — Inpatient Hospital Stay (HOSPITAL_BASED_OUTPATIENT_CLINIC_OR_DEPARTMENT_OTHER): Payer: BC Managed Care – PPO | Admitting: Oncology

## 2020-01-17 DIAGNOSIS — D508 Other iron deficiency anemias: Secondary | ICD-10-CM | POA: Diagnosis not present

## 2020-01-17 DIAGNOSIS — O99012 Anemia complicating pregnancy, second trimester: Secondary | ICD-10-CM

## 2020-01-17 NOTE — Progress Notes (Signed)
HEMATOLOGY-ONCOLOGY TeleHEALTH VISIT PROGRESS NOTE  I connected with Anna Whitehead on 01/17/20 at  2:30 PM EDT by video enabled telemedicine visit and verified that I am speaking with the correct person using two identifiers. I discussed the limitations, risks, security and privacy concerns of performing an evaluation and management service by telemedicine and the availability of in-person appointments. I also discussed with the patient that there may be a patient responsible charge related to this service. The patient expressed understanding and agreed to proceed.   Other persons participating in the visit and their role in the encounter:  None  Patient's location: in vehicle, not driving.  Provider's location: office Chief Complaint: iron deficiency anemia.   INTERVAL HISTORY Anna Whitehead is a 34 y.o. female who has above history reviewed by me today presents for follow up visit for management of iron deficiency anemia.  Problems and complaints are listed below:  She is in her third trimester and due date is 02/11/20. No new complaints. Some fatigue.  Previously tolerated IV iron infusion previously. Additional IV venofer treatments were held due to MRA.  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 Otherwise unremarkable appearance of the brain.3. Negative head MRA. She follows up with neurosurgeon.   Review of Systems  Constitutional: Positive for fatigue. Negative for appetite change, chills and fever.  HENT:   Negative for hearing loss and voice change.   Eyes: Negative for eye problems.  Respiratory: Negative for chest tightness and cough.   Cardiovascular: Negative for chest pain.  Gastrointestinal: Negative for abdominal distention, abdominal pain and blood in stool.  Endocrine: Negative for hot flashes.  Genitourinary: Negative for difficulty urinating and frequency.   Musculoskeletal:  Negative for arthralgias.  Skin: Negative for itching and rash.  Neurological: Negative for extremity weakness.  Hematological: Negative for adenopathy.  Psychiatric/Behavioral: Negative for confusion.    Past Medical History:  Diagnosis Date  . Allergic eczema   . Anxiety   . Depression   . Dysmenorrhea   . Family history of breast cancer    4/21 Cancer genetic testing letter sent  . Iron deficiency anemia 10/03/2019  . Iron deficiency anemia due to chronic blood loss   . Menorrhagia   . Midline low back pain with left-sided sciatica   . Mild asthma    seasonal, not used recently  . Overweight (BMI 25.0-29.9)   . Right foot pain   . Seasonal allergic rhinitis    Past Surgical History:  Procedure Laterality Date  . MYRINGOPLASTY     Did not happen  . NO PAST SURGERIES      Family History  Problem Relation Age of Onset  . Bipolar disorder Mother   . Breast cancer Maternal Aunt 40  . Breast cancer Maternal Aunt 50  . Pancreatic cancer Maternal Aunt 70  . Ovarian cancer Maternal Great-grandmother     Social History   Socioeconomic History  . Marital status: Married    Spouse name: Para March  . Number of children: 1  . Years of education: Not on file  . Highest education level: Bachelor's degree (e.g., BA, AB, BS)  Occupational History  . Occupation: Runner, broadcasting/film/video  Tobacco Use  . Smoking status: Never Smoker  . Smokeless tobacco: Never Used  Vaping Use  . Vaping Use: Never used  Substance and Sexual Activity  . Alcohol use: Yes    Alcohol/week: 0.0 standard drinks  Comment: ocassionally  . Drug use: No  . Sexual activity: Yes    Partners: Male    Birth control/protection: None  Other Topics Concern  . Not on file  Social History Narrative   Married, first son was born 11/13/2016      Patient is getting her Master's this summer   Social Determinants of Health   Financial Resource Strain:   . Difficulty of Paying Living Expenses: Not on file  Food Insecurity:    . Worried About Programme researcher, broadcasting/film/video in the Last Year: Not on file  . Ran Out of Food in the Last Year: Not on file  Transportation Needs:   . Lack of Transportation (Medical): Not on file  . Lack of Transportation (Non-Medical): Not on file  Physical Activity:   . Days of Exercise per Week: Not on file  . Minutes of Exercise per Session: Not on file  Stress:   . Feeling of Stress : Not on file  Social Connections:   . Frequency of Communication with Friends and Family: Not on file  . Frequency of Social Gatherings with Friends and Family: Not on file  . Attends Religious Services: Not on file  . Active Member of Clubs or Organizations: Not on file  . Attends Banker Meetings: Not on file  . Marital Status: Not on file  Intimate Partner Violence:   . Fear of Current or Ex-Partner: Not on file  . Emotionally Abused: Not on file  . Physically Abused: Not on file  . Sexually Abused: Not on file    Current Outpatient Medications on File Prior to Visit  Medication Sig Dispense Refill  . doxylamine, Sleep, (UNISOM) 25 MG tablet Take 25 mg by mouth at bedtime as needed.    Marland Kitchen escitalopram (LEXAPRO) 10 MG tablet Take 1 tablet (10 mg total) by mouth daily. 30 tablet 2  . ondansetron (ZOFRAN ODT) 4 MG disintegrating tablet Take 1 tablet (4 mg total) by mouth every 6 (six) hours as needed for nausea. 20 tablet 0  . Prenatal Multivit-Min-Fe-FA (PRENATAL, W/IRON & FA,) 27-0.8 MG TABS Take 1 tablet by mouth daily.    . Pyridoxine HCl (VITAMIN B-6) 250 MG tablet Take 250 mg by mouth daily.     No current facility-administered medications on file prior to visit.    No Known Allergies     Observations/Objective: Today's Vitals   01/17/20 1337  PainSc: 0-No pain   There is no height or weight on file to calculate BMI.  Physical Exam Neurological:     Mental Status: She is alert.     CBC    Component Value Date/Time   WBC 7.2 01/16/2020 1406   RBC 3.83 (L) 01/16/2020  1406   HGB 11.3 (L) 01/16/2020 1406   HGB 12.1 11/25/2019 1611   HCT 34.9 (L) 01/16/2020 1406   HCT 37.2 11/25/2019 1611   PLT 190 01/16/2020 1406   PLT 248 11/25/2019 1611   MCV 91.1 01/16/2020 1406   MCV 92 11/25/2019 1611   MCH 29.5 01/16/2020 1406   MCHC 32.4 01/16/2020 1406   RDW 16.3 (H) 01/16/2020 1406   RDW 20.9 (H) 11/25/2019 1611   LYMPHSABS 1.5 01/16/2020 1406   LYMPHSABS 1.4 11/25/2019 1611   MONOABS 0.4 01/16/2020 1406   EOSABS 0.2 01/16/2020 1406   EOSABS 0.3 11/25/2019 1611   BASOSABS 0.0 01/16/2020 1406   BASOSABS 0.0 11/25/2019 1611    CMP     Component Value Date/Time  NA 139 09/13/2019 1631   K 4.2 09/13/2019 1631   CL 104 09/13/2019 1631   CO2 22 09/13/2019 1631   GLUCOSE 94 09/13/2019 1631   GLUCOSE 92 04/13/2019 1638   BUN 7 09/13/2019 1631   CREATININE 0.72 09/13/2019 1631   CREATININE 0.73 04/13/2019 1638   CALCIUM 8.8 09/13/2019 1631   PROT 6.5 09/13/2019 1631   ALBUMIN 3.9 09/13/2019 1631   AST 18 09/13/2019 1631   ALT 16 09/13/2019 1631   ALKPHOS 69 09/13/2019 1631   BILITOT <0.2 09/13/2019 1631   GFRNONAA 112 09/13/2019 1631   GFRNONAA 110 04/13/2019 1638   GFRAA 129 09/13/2019 1631   GFRAA 127 04/13/2019 1638     Assessment and Plan: 1. Anemia during pregnancy in second trimester   2. Other iron deficiency anemia     Labs are reviewed and discussed with patient. Hemoglobin is 11.3,  Iron panel showed ferritin 5, iron saturation 11, TIBC 641.  Given that she may have additional blood loss during birth, which may worsen her IDA, recommend IV Venofer weeklyx 3.  She will follow up at the end of December for re-evaluation.   I discussed the assessment and treatment plan with the patient. The patient was provided an opportunity to ask questions and all were answered. The patient agreed with the plan and demonstrated an understanding of the instructions.  The patient was advised to call back or seek an in-person evaluation if the  symptoms worsen or if the condition fails to improve as anticipated.    Rickard Patience, MD 01/17/2020 11:49 PM

## 2020-01-18 ENCOUNTER — Other Ambulatory Visit: Payer: Self-pay

## 2020-01-18 ENCOUNTER — Inpatient Hospital Stay: Payer: BC Managed Care – PPO

## 2020-01-18 VITALS — BP 133/78 | HR 76 | Temp 97.5°F | Resp 20

## 2020-01-18 DIAGNOSIS — O99012 Anemia complicating pregnancy, second trimester: Secondary | ICD-10-CM | POA: Diagnosis not present

## 2020-01-18 MED ORDER — IRON SUCROSE 20 MG/ML IV SOLN
200.0000 mg | Freq: Once | INTRAVENOUS | Status: AC
  Administered 2020-01-18: 200 mg via INTRAVENOUS
  Filled 2020-01-18: qty 10

## 2020-01-18 MED ORDER — SODIUM CHLORIDE 0.9 % IV SOLN
Freq: Once | INTRAVENOUS | Status: AC
  Filled 2020-01-18: qty 250

## 2020-01-18 MED ORDER — SODIUM CHLORIDE 0.9 % IV SOLN: 200.0000 mg | Freq: Once | INTRAVENOUS | Status: DC

## 2020-01-19 ENCOUNTER — Ambulatory Visit (INDEPENDENT_AMBULATORY_CARE_PROVIDER_SITE_OTHER): Payer: BC Managed Care – PPO | Admitting: Advanced Practice Midwife

## 2020-01-19 ENCOUNTER — Encounter: Payer: Self-pay | Admitting: Advanced Practice Midwife

## 2020-01-19 ENCOUNTER — Telehealth: Payer: Self-pay

## 2020-01-19 ENCOUNTER — Ambulatory Visit: Payer: BC Managed Care – PPO

## 2020-01-19 VITALS — BP 114/76 | Wt 226.0 lb

## 2020-01-19 DIAGNOSIS — Z0130 Encounter for examination of blood pressure without abnormal findings: Secondary | ICD-10-CM

## 2020-01-19 DIAGNOSIS — O99013 Anemia complicating pregnancy, third trimester: Secondary | ICD-10-CM

## 2020-01-19 DIAGNOSIS — Z3A36 36 weeks gestation of pregnancy: Secondary | ICD-10-CM

## 2020-01-19 DIAGNOSIS — O0993 Supervision of high risk pregnancy, unspecified, third trimester: Secondary | ICD-10-CM

## 2020-01-19 DIAGNOSIS — O09299 Supervision of pregnancy with other poor reproductive or obstetric history, unspecified trimester: Secondary | ICD-10-CM

## 2020-01-19 LAB — POCT URINALYSIS DIPSTICK OB: Glucose, UA: NEGATIVE
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
## 2020-01-20 LAB — CULTURE, BETA STREP (GROUP B ONLY): Strep Gp B Culture: NEGATIVE

## 2020-01-23 ENCOUNTER — Ambulatory Visit (INDEPENDENT_AMBULATORY_CARE_PROVIDER_SITE_OTHER): Payer: BC Managed Care – PPO | Admitting: Obstetrics and Gynecology

## 2020-01-23 ENCOUNTER — Encounter: Payer: Self-pay | Admitting: Obstetrics and Gynecology

## 2020-01-23 ENCOUNTER — Other Ambulatory Visit: Payer: Self-pay

## 2020-01-23 VITALS — BP 122/74 | Wt 227.0 lb

## 2020-01-23 DIAGNOSIS — Z6791 Unspecified blood type, Rh negative: Secondary | ICD-10-CM

## 2020-01-23 DIAGNOSIS — O0990 Supervision of high risk pregnancy, unspecified, unspecified trimester: Secondary | ICD-10-CM

## 2020-01-23 DIAGNOSIS — O09299 Supervision of pregnancy with other poor reproductive or obstetric history, unspecified trimester: Secondary | ICD-10-CM

## 2020-01-23 DIAGNOSIS — O99213 Obesity complicating pregnancy, third trimester: Secondary | ICD-10-CM

## 2020-01-23 DIAGNOSIS — O99012 Anemia complicating pregnancy, second trimester: Secondary | ICD-10-CM

## 2020-01-23 DIAGNOSIS — O26899 Other specified pregnancy related conditions, unspecified trimester: Secondary | ICD-10-CM

## 2020-01-23 DIAGNOSIS — Z3A37 37 weeks gestation of pregnancy: Secondary | ICD-10-CM

## 2020-01-23 NOTE — Progress Notes (Signed)
Routine Prenatal Care Visit  Subjective  Anna Whitehead is a 34 y.o. G2P1001 at [redacted]w[redacted]d being seen today for ongoing prenatal care.  She is currently monitored for the following issues for this high-risk pregnancy and has Asthma, mild; H/O high risk medication treatment; Overweight; Anxiety and depression; History of gestational hypertension; Depression, major, recurrent, moderate (HCC); B12 deficiency; Vitamin D insufficiency; Supervision of high risk pregnancy, antepartum; Hx of preeclampsia, prior pregnancy, currently pregnant; Family history of breast cancer; Family history of pancreatic cancer; Obesity affecting pregnancy; Rh negative state in antepartum period; and Anemia during pregnancy in second trimester on their problem list.  ----------------------------------------------------------------------------------- Patient reports still having some dizzyness. She is no longer driving.  No acute complaints today.   Contractions: Irregular. Vag. Bleeding: None.  Movement: Present. Leaking Fluid denies.  ----------------------------------------------------------------------------------- The following portions of the patient's history were reviewed and updated as appropriate: allergies, current medications, past family history, past medical history, past social history, past surgical history and problem list. Problem list updated.  Objective  Blood pressure 122/74, weight 227 lb (103 kg), last menstrual period 05/07/2019. Pregravid weight 240 lb (108.9 kg) Total Weight Gain -13 lb (-5.897 kg) Urinalysis: Urine Protein    Urine Glucose    Fetal Status: Fetal Heart Rate (bpm): 120 Fundal Height: 37 cm Movement: Present  Presentation: Vertex  General:  Alert, oriented and cooperative. Patient is in no acute distress.  Skin: Skin is warm and dry. No rash noted.   Cardiovascular: Normal heart rate noted  Respiratory: Normal respiratory effort, no problems with respiration noted  Abdomen: Soft,  gravid, appropriate for gestational age. Pain/Pressure: Present     Pelvic:  Cervical exam deferred        Extremities: Normal range of motion.  Edema: None  Mental Status: Normal mood and affect. Normal behavior. Normal judgment and thought content.   BSUS: cephalic, subjectively normal fluid  Assessment   34 y.o. G2P1001 at [redacted]w[redacted]d by  02/11/2020, by Last Menstrual Period presenting for routine prenatal visit  Plan   Pregnancy#2 Problems (from 05/07/19 to present)    Problem Noted Resolved   Obesity affecting pregnancy 08/29/2019 by Conard Novak, MD No   Rh negative state in antepartum period 08/29/2019 by Conard Novak, MD No   Overview Signed 11/25/2019  3:44 PM by Mirna Mires, CNM    Rhogam 9/10      Supervision of high risk pregnancy, antepartum 06/16/2019 by Vena Austria, MD No   Overview Addendum 01/16/2020  4:18 PM by Nadara Mustard, MD     Nursing Staff Provider  Office Location  [facility] Dating   LMP = 6 wk [place]  Language  English Anatomy US    Flu Vaccine  10/21 Genetic Screen  NIPS:   AFP:   First Screen:  Declined MaternT  TDaP vaccine   10/21 Early GTT135 Early :9/10 Third trimester :   Rhogam  28 weeks   LAB RESULTS   Feeding Plan Breast Blood Type O/Negative/-- (04/05 1019)   Contraception IUD Antibody Negative (04/05 1019)  Circumcision  Rubella 1.89 (04/05 1019)  Pediatrician   RPR Non Reactive (04/05 1019)   Support Person Para March FOB HBsAg Negative (04/05 1019)   Prenatal Classes  HIV Non Reactive (04/05 1019)    Varicella  Immune  BTL Consent  GBS  (For PCN allergy, check sensitivities)        VBAC Consent n/a Pap      Hgb Electro  CF      SMA               Previous Version       Term labor symptoms and general obstetric precautions including but not limited to vaginal bleeding, contractions, leaking of fluid and fetal movement were reviewed in detail with the patient. Please refer to After Visit Summary for other  counseling recommendations.   - IOL tentatively scheduled for 11/20. After scheduling she let me know that she might prefer a CNM delivery. So, she may elect to change the date of her IOL.   Return in about 1 week (around 01/30/2020) for Routine Prenatal Appointment.   [name], MD, [facility], [facility] 01/23/2020 3:25 PM

## 2020-01-24 ENCOUNTER — Telehealth: Payer: Self-pay

## 2020-01-24 NOTE — Telephone Encounter (Signed)
Pt calling; the work note she received wasn't signed nor did it have the start date which is the 11/15th.  [phone]  Pt aware note is ready.  Pt requested it be faxed to [employer] [place] [employer] at [phone].  Faxed and confirmation recv'd.

## 2020-01-26 ENCOUNTER — Other Ambulatory Visit: Payer: Self-pay

## 2020-01-26 ENCOUNTER — Inpatient Hospital Stay: Payer: BC Managed Care – PPO

## 2020-01-26 VITALS — BP 118/74 | HR 82 | Resp 18

## 2020-01-26 DIAGNOSIS — O99012 Anemia complicating pregnancy, second trimester: Secondary | ICD-10-CM

## 2020-01-26 MED ORDER — SODIUM CHLORIDE 0.9 % IV SOLN: 200.0000 mg | Freq: Once | INTRAVENOUS | Status: DC

## 2020-01-26 MED ORDER — SODIUM CHLORIDE 0.9 % IV SOLN
INTRAVENOUS | Status: DC
  Filled 2020-01-26: qty 250

## 2020-01-26 MED ORDER — IRON SUCROSE 20 MG/ML IV SOLN
200.0000 mg | Freq: Once | INTRAVENOUS | Status: AC
  Administered 2020-01-26: 200 mg via INTRAVENOUS
  Filled 2020-01-26: qty 10

## 2020-01-30 ENCOUNTER — Other Ambulatory Visit: Payer: Self-pay

## 2020-01-30 ENCOUNTER — Ambulatory Visit (INDEPENDENT_AMBULATORY_CARE_PROVIDER_SITE_OTHER): Payer: BC Managed Care – PPO | Admitting: Obstetrics

## 2020-01-30 VITALS — BP 104/80 | Wt 221.0 lb

## 2020-01-30 DIAGNOSIS — Z3A38 38 weeks gestation of pregnancy: Secondary | ICD-10-CM

## 2020-01-30 DIAGNOSIS — O0990 Supervision of high risk pregnancy, unspecified, unspecified trimester: Secondary | ICD-10-CM

## 2020-01-30 LAB — POCT URINALYSIS DIPSTICK OB: Glucose, UA: NEGATIVE

## 2020-01-30 NOTE — Progress Notes (Signed)
C/o IOL sched for Saturday.rj

## 2020-01-30 NOTE — Progress Notes (Signed)
Routine Prenatal Care Visit  Subjective  Anna Whitehead is a 34 y.o. G2P1001 at [redacted]w[redacted]d being seen today for ongoing prenatal care.  She is currently monitored for the following issues for this low-risk pregnancy and has Asthma, mild; H/O high risk medication treatment; Overweight; Anxiety and depression; History of gestational hypertension; Depression, major, recurrent, moderate (HCC); B12 deficiency; Vitamin D insufficiency; Supervision of high risk pregnancy, antepartum; Hx of preeclampsia, prior pregnancy, currently pregnant; Family history of breast cancer; Family history of pancreatic cancer; Obesity affecting pregnancy; Rh negative state in antepartum period; and Anemia during pregnancy in second trimester on their problem list.  ----------------------------------------------------------------------------------- Patient reports no complaints.  She is set up for IOL this coming Saturday with Dr. Jean Rosenthal and Jae Dire, CNM.  Marland Kitchen  Marland Kitchen   . Leaking Fluid denies.  ----------------------------------------------------------------------------------- The following portions of the patient's history were reviewed and updated as appropriate: allergies, current medications, past family history, past medical history, past social history, past surgical history and problem list. Problem list updated.  Objective  Blood pressure 104/80, weight 221 lb (100.2 kg), last menstrual period 05/07/2019. Pregravid weight 240 lb (108.9 kg) Total Weight Gain -19 lb (-8.618 kg) Urinalysis: Urine Protein Small (1+)  Urine Glucose Negative  Fetal Status:           General:  Alert, oriented and cooperative. Patient is in no acute distress.  Skin: Skin is warm and dry. No rash noted.   Cardiovascular: Normal heart rate noted  Respiratory: Normal respiratory effort, no problems with respiration noted  Abdomen: Soft, gravid, appropriate for gestational age.       Pelvic:  Cervical exam performed      3cms/50%/-3 cervix was  posterior, only moderately soft. Palpable pea sized polyp or Nabothian cyst palpated at edge of her cervix.  Extremities: Normal range of motion.     Mental Status: Normal mood and affect. Normal behavior. Normal judgment and thought content.   Assessment   34 y.o. G2P1001 at [redacted]w[redacted]d by  02/11/2020, by Last Menstrual Period presenting for routine prenatal visit  Plan   Pregnancy#2 Problems (from 05/07/19 to present)    Problem Noted Resolved   Obesity affecting pregnancy 08/29/2019 by Conard Novak, MD No   Rh negative state in antepartum period 08/29/2019 by Conard Novak, MD No   Overview Signed 11/25/2019  3:44 PM by Mirna Mires, CNM    Rhogam 9/10      Supervision of high risk pregnancy, antepartum 06/16/2019 by Vena Austria, MD No   Overview Addendum 01/16/2020  4:18 PM by Nadara Mustard, MD     Nursing Staff Provider  Office Location  [facility] Dating   LMP = 6 wk [place]  Language  English Anatomy US    Flu Vaccine  10/21 Genetic Screen  NIPS:   AFP:   First Screen:  Declined MaternT  TDaP vaccine   10/21 Early GTT135 Early :9/10 Third trimester :   Rhogam  28 weeks   LAB RESULTS   Feeding Plan Breast Blood Type O/Negative/-- (04/05 1019)   Contraception IUD Antibody Negative (04/05 1019)  Circumcision  Rubella 1.89 (04/05 1019)  Pediatrician   RPR Non Reactive (04/05 1019)   Support Person Para March FOB HBsAg Negative (04/05 1019)   Prenatal Classes  HIV Non Reactive (04/05 1019)    Varicella  Immune  BTL Consent  GBS  (For PCN allergy, check sensitivities)        VBAC Consent n/a Pap  Hgb Electro      CF      SMA               Previous Version       Term labor symptoms and general obstetric precautions including but not limited to vaginal bleeding, contractions, leaking of fluid and fetal movement were reviewed in detail with the patient. Please refer to After Visit Summary for other counseling recommendations.  With her consent, a cervical  sweep is performed.  Return if symptoms worsen or fail to improve.  Mirna Mires, CNM  01/30/2020 5:20 PM

## 2020-02-01 ENCOUNTER — Telehealth: Payer: Self-pay

## 2020-02-01 ENCOUNTER — Encounter: Payer: Self-pay | Admitting: Neurology

## 2020-02-01 ENCOUNTER — Other Ambulatory Visit: Payer: Self-pay

## 2020-02-01 ENCOUNTER — Ambulatory Visit: Payer: BC Managed Care – PPO | Admitting: Neurology

## 2020-02-01 VITALS — BP 132/92 | HR 79 | Ht 70.0 in | Wt 220.0 lb

## 2020-02-01 DIAGNOSIS — O09299 Supervision of pregnancy with other poor reproductive or obstetric history, unspecified trimester: Secondary | ICD-10-CM

## 2020-02-01 DIAGNOSIS — O0990 Supervision of high risk pregnancy, unspecified, unspecified trimester: Secondary | ICD-10-CM

## 2020-02-01 DIAGNOSIS — Z3A39 39 weeks gestation of pregnancy: Secondary | ICD-10-CM

## 2020-02-01 DIAGNOSIS — R930 Abnormal findings on diagnostic imaging of skull and head, not elsewhere classified: Secondary | ICD-10-CM

## 2020-02-01 NOTE — Telephone Encounter (Signed)
Sandy fro Dr. Paula Compton Vaught calling to see if we recv'd fax to give clearance for pt to take gaviscon for reflux; if so have we responded?  Fax# [phone]  Office # [phone] ext [phone].

## 2020-02-01 NOTE — Progress Notes (Signed)
[facility]    Provider:  Melvyn Novas, MD  Primary Care Physician:  Alba Cory, MD [address] [place] [address]     Referring Provider: Bud Face, Md [address] [place],  [place] [address] ENT          Chief Complaint according to patient   Patient presents with:     New Patient (Initial Visit)     Have the pleasure of meeting Anna Whitehead today, a 34 year old [redacted] weeks pregnant Caucasian female who had presented to her primary care and ear nose and throat after a tinnitus, pulsatile in the right ear since June- later had several syncope fainting spells , last one 2 weeks ago. No vertigo. Tunnel vision, no sounds- vomiting-this happened 4 times during this pregnancy thus far,  while driving.  ENT had referred to neurosurgeon who then referred for MRI brain the patient had an MRI angio of the head all major intracranial vascular structures were flow-void and therefore normal and preserved unremarkable orbits sinuses and mastoid, she had slightly decreased bone marrow signal which is consistent with a history of anemia.  Normal cranial nerve structures, the MRA finding visualized normal arteries widely patent.  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  Otherwise unremarkable appearance of the brain and negative MRI.  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 MRV was ordered-       HISTORY OF PRESENT ILLNESS:  Anna Whitehead is a 34 y.o. year old White or Caucasian female patient seen here as a referral on 02/01/2020 from PCP in [place]. Chief concern according to patient :  Have the pleasure of meeting Anna Whitehead today, a 34 year old [redacted] weeks pregnant Caucasian female who had presented to her primary care and ear nose and throat  after a tinnitus, pulsatile in the right ear since June- later had several syncope fainting spells , last one 2 weeks ago. No vertigo. Tunnel vision, no sounds- vomiting-this happened 4 times during this pregnancy thus far,  while driving.  ENT had referred to neurosurgeon who then referred for MRI brain the patient had an MRI angio of the head all major intracranial vascular structures were flow-void and therefore normal and preserved unremarkable orbits sinuses and mastoid, she had slightly decreased bone marrow signal which is consistent with a history of anemia.  Normal cranial nerve structures, the MRA finding visualized normal arteries widely patent.  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  Otherwise unremarkable appearance of the brain and negative MRI.  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 MRV was ordered-      I have the pleasure of seeing Anna Whitehead today, a right -handed White or Caucasian female with pulsatile tinnitus, several near syncopes with vomiting while driving in pregnancy.  She is gravida 2, para 1- has a  has a past medical history of Allergic eczema, Anxiety, Depression, Dysmenorrhea, Family history of breast cancer, Iron deficiency anemia (10/03/2019), Iron deficiency anemia due to chronic blood loss, Menorrhagia, Midline low back pain with left-sided sciatica, Mild asthma, Overweight (BMI 25.0-29.9), Right foot pain, and Seasonal allergic  rhinitis.     Review of Systems: Out of a complete 14 system review, the patient complains of only the following symptoms, and all other reviewed systems are negative.:  Have the pleasure of meeting Anna Whitehead today, a 34 year old [redacted] weeks pregnant Caucasian female who had presented to her primary care and ear nose and throat after a tinnitus, pulsatile in  the right ear since June- later had several syncope fainting spells , last one 2 weeks ago. No vertigo. Tunnel vision, no sounds- vomiting-this happened 4 times during this pregnancy thus far,  while driving.  ENT had referred to neurosurgeon who then referred for MRI brain the patient had an MRI angio of the head all major intracranial vascular structures were flow-void and therefore normal and preserved unremarkable orbits sinuses and mastoid, she had slightly decreased bone marrow signal which is consistent with a history of anemia.  Normal cranial nerve structures, the MRA finding visualized normal arteries widely patent.  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  Otherwise unremarkable appearance of the brain and negative MRI.  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 MRV was ordered-     Social History   Socioeconomic History   Marital status: Married    Spouse name: Para March   Number of children: 1   Years of education: Not on file   Highest education level: Bachelor's degree (e.g., BA, AB, BS)  Occupational History   Occupation: Runner, broadcasting/film/video  Tobacco Use   Smoking status: Never Smoker   Smokeless tobacco: Never Used  Vaping Use   Vaping Use: Never used  Substance and Sexual Activity   Alcohol use: Not Currently    Alcohol/week: 0.0 standard drinks   Drug use: No   Sexual activity: Yes    Partners: Male    Birth control/protection: None  Other Topics Concern   Not on file  Social History Narrative   Married, first son was born 11/13/2016      Patient is getting her Master's this summer   Social Determinants of Health   Financial Resource Strain:    Difficulty of Paying Living Expenses: Not on file  Food Insecurity:    Worried About Programme researcher, broadcasting/film/video in the Last Year: Not on file    [employer] of Food in the Last Year: Not on file  Transportation Needs:    Lack of Transportation (Medical): Not on file   Lack of Transportation (Non-Medical): Not on file  Physical Activity:    Days of Exercise per Week: Not on file   Minutes of Exercise per Session: Not on file  Stress:    Feeling of Stress : Not on file  Social Connections:    Frequency of Communication with Friends and Family: Not on file   Frequency of Social Gatherings with Friends and Family: Not on file   Attends Religious Services: Not on file   Active Member of Clubs or Organizations: Not on file   Attends Banker Meetings: Not on file   Marital Status: Not on file    Family History  Problem Relation Age of Onset   Bipolar disorder Mother    Breast cancer Maternal Aunt 40   Breast cancer Maternal Aunt 50   Pancreatic cancer Maternal Aunt 72   Ovarian cancer Maternal Great-grandmother  Past Medical History:  Diagnosis Date   Allergic eczema    Anxiety    Depression    Dysmenorrhea    Family history of breast cancer    4/21 Cancer genetic testing letter sent   Iron deficiency anemia 10/03/2019   Iron deficiency anemia due to chronic blood loss    Menorrhagia    Midline low back pain with left-sided sciatica    Mild asthma    seasonal, not used recently   Overweight (BMI 25.0-29.9)    Right foot pain    Seasonal allergic rhinitis     Past Surgical History:  Procedure Laterality Date   NO PAST SURGERIES       Current Outpatient Medications on File Prior to Visit  Medication Sig Dispense Refill   doxylamine, Sleep, (UNISOM) 25 MG tablet Take 25 mg by mouth at bedtime as needed.     escitalopram (LEXAPRO) 10 MG tablet Take 1 tablet (10 mg total) by mouth daily. 30 tablet 2   ondansetron (ZOFRAN ODT) 4 MG disintegrating tablet Take 1 tablet (4 mg total) by mouth every 6 (six) hours as needed for nausea. 20 tablet 0   Prenatal Multivit-Min-Fe-FA  (PRENATAL, W/IRON & FA,) 27-0.8 MG TABS Take 1 tablet by mouth daily.     Pyridoxine HCl (VITAMIN B-6) 250 MG tablet Take 250 mg by mouth daily.     No current facility-administered medications on file prior to visit.   Physical exam:  Today's Vitals   02/01/20 1525  BP: (!) 132/92  Pulse: 79  Weight: 220 lb (99.8 kg)  Height: 5\' 10"  (1.778 m)   Body mass index is 31.57 kg/m.   Wt Readings from Last 3 Encounters:  02/01/20 220 lb (99.8 kg)  01/30/20 221 lb (100.2 kg)  01/23/20 227 lb (103 kg)     Ht Readings from Last 3 Encounters:  02/01/20 5\' 10"  (1.778 m)  05/27/19 5\' 10"  (1.778 m)  05/11/19 5\' 10"  (1.778 m)      General: The patient is awake, alert and appears not in acute distress. The patient is well groomed. Head: Normocephalic, atraumatic. Neck is supple.Cardiovascular:  Regular rate and cardiac rhythm by pulse,  without distended neck veins. Respiratory: Lungs are clear to auscultation.  Skin:  Without evidence of ankle edema, or rash. Trunk: The patient's posture is erect.   Neurologic exam : The patient is awake and alert, oriented to place and time.   Memory subjective described as intact.  Attention span & concentration ability appears normal.  Speech is fluent,  without  dysarthria, dysphonia or aphasia.  Mood and affect are appropriate.   Cranial nerves: no loss of smell or taste reported  Pupils are unequal but briskly reactive to light.  Longstanding pupillary asymmetry, since childhood- lef pupil is larger , by 1 mm, consensual and direct light reflexes were preserved.  Funduscopic exam deferred.  Extraocular movements in vertical and horizontal planes were intact and without nystagmus. No Diplopia. Visual fields by finger perimetry are intact. Hearing was intact to soft voice and finger rubbing.    Facial sensation intact to fine touch.  Facial motor strength is symmetric and tongue and uvula move midline.   Neck ROM : rotation, tilt and  flexion extension were normal for age and shoulder shrug was symmetrical.    Motor exam:  Symmetric bulk, tone and ROM.   Normal tone without cog- wheeling, symmetric grip strength .   Sensory:  Fine touch, pinprick and vibration were normal.  Proprioception tested in the upper extremities was normal.   Coordination: Rapid alternating movements in the fingers/hands were of normal speed.  The Finger-to-nose maneuver was intact without evidence of ataxia, dysmetria or tremor.   Gait and station: Patient could rise unassisted from a seated position, walked without assistive device.  Stance is of normal width/ base and the patient turned with 3 steps.  Toe and heel walk were deferred.  Deep tendon reflexes: in the  upper and lower extremities are symmetric and intact.  Babinski response was normal      IMPRESSION: No evidence of venous thrombosis. Mild narrowing of the distal right transverse sinus just proximal to the sigmoid sinus at the site of the previously described temporal lobe encroachment. This is the non dominant side and I would not expect this to be symptomatic   After spending a total time of 53 minutes face to face and additional time for physical and neurologic examination, review of laboratory studies,  personal review of imaging studies, reports and results of other testing and review of referral information / records as far as provided in visit, I have established the following assessments:  Complex decision making process, Level 5  1) Mrs. Stair delivered 4 years ago healthy baby without complications by vaginal delivery.  The pulsatile tinnitus however only occurred in this pregnancy and there were no comparable symptoms present in her last pregnancy.  It is possible that the pulsatile tinnitus is related to a small vascular abnormality yet there is no evidence of aneurysm or blood clotting neither of a arteriovenous malformation. This pregnancy is associated with  hyperemesis gravidarum through 3 trimesters.  Sometimes she sees little stars flickering in front of her eyes.  No headaches were associated with the spells.  Her four spells that culminated in vomiting and presyncopal symptoms were unrelated to rapid head movements or any kind of positional change, there have certainly not benign positional vertigo as no vertigo sensation was there.  The question remains if this could have been seizure-like spells yet the patient did not lose consciousness.  She was able to continue her driving to stop at the side of the road-her visual field was impaired and it was not safe for her to continue but she was able to direct the car and again she never lost consciousness or awareness.    My Plan is to proceed with:  1) to error on the side of caution the medical advice would be to deliver by cesarean section to avoid excessive blood pressure spikes during labor.    I would certainly after Mrs. Borak has delivered we should follow with an EEG, right carotid study, and may be brain CT contrast  -  and seek also neurosurgical input.  Apparently, she had already been seen by [facility] neurosurgery Lucy Chris at [facility] on 12-21-2019, After MRAngio and brain MRI- he ordered the MRV.  She was not told what risks are present in terms of influencing her birth plan.     I would like to thank Alba Cory, MD and Bud Face, Md [address] [place],  [place] [address] for allowing me to meet with and to take care of this pleasant patient.   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 Likely there for many years.  The  relationship to hyperemesis and pulsatile tinnitus is questionable.  CC:  However, her delivery should be c- section for reasons of caution, avoiding high intracranial pressures.    Electronically  signed by: Melvyn Novas, MD 02/01/2020 3:41 PM  [facility] Board certified by The [employer] of Sleep Medicine and Diplomate of the [employer] of Sleep Medicine. Board certified In Neurology through the ABPN, Fellow of the [employer] of Neurology. Medical Director of [facility].

## 2020-02-02 ENCOUNTER — Ambulatory Visit (INDEPENDENT_AMBULATORY_CARE_PROVIDER_SITE_OTHER): Payer: BC Managed Care – PPO | Admitting: Obstetrics & Gynecology

## 2020-02-02 ENCOUNTER — Encounter: Payer: Self-pay | Admitting: Obstetrics & Gynecology

## 2020-02-02 ENCOUNTER — Telehealth: Payer: Self-pay | Admitting: Neurology

## 2020-02-02 ENCOUNTER — Other Ambulatory Visit: Payer: Self-pay

## 2020-02-02 ENCOUNTER — Other Ambulatory Visit
Admission: RE | Admit: 2020-02-02 | Discharge: 2020-02-02 | Disposition: A | Discharge status: Home / Self Care | Payer: BC Managed Care – PPO | Source: Ambulatory Visit | Attending: Obstetrics and Gynecology | Admitting: Obstetrics and Gynecology

## 2020-02-02 VITALS — BP 120/80 | Ht 70.0 in | Wt 221.0 lb

## 2020-02-02 DIAGNOSIS — G935 Compression of brain: Secondary | ICD-10-CM

## 2020-02-02 DIAGNOSIS — Z20822 Contact with and (suspected) exposure to covid-19: Secondary | ICD-10-CM | POA: Insufficient documentation

## 2020-02-02 DIAGNOSIS — Z01812 Encounter for preprocedural laboratory examination: Secondary | ICD-10-CM | POA: Insufficient documentation

## 2020-02-02 NOTE — Telephone Encounter (Signed)
left message for patient to call me back  [employer] auth: [id number] (exp. 02/02/20 to 07/30/20)

## 2020-02-02 NOTE — Progress Notes (Signed)
History of Present Illness:  Anna Whitehead is a 34 y.o. who recently underwent evaluation for her sx's of whooshing and heartbeat sound in right ear, dizziness, and near-syncope.  This has been occurring during the course of her current pregnancy for which she is 39 weeks now.  She was seen by Neurology yesterday.  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 Likely there for many years.  The  relationship to hyperemesis and pulsatile tinnitus is questionable.  CC:  However, her delivery should be c- section for reasons of caution, avoiding high intracranial pressures.   Today we are here to discuss options for delivery, with these new found concerns in mind.  PMHx: She  has a past medical history of Allergic eczema, Anxiety, Depression, Dysmenorrhea, Family history of breast cancer, Iron deficiency anemia (10/03/2019), Iron deficiency anemia due to chronic blood loss, Menorrhagia, Midline low back pain with left-sided sciatica, Mild asthma, Overweight (BMI 25.0-29.9), Right foot pain, and Seasonal allergic rhinitis. Also,  has a past surgical history that includes No past surgeries., family history includes Bipolar disorder in her mother; Breast cancer (age of onset: 53) in her maternal aunt; Breast cancer (age of onset: 59) in her maternal aunt; Ovarian cancer in her maternal great-grandmother; Pancreatic cancer (age of onset: 34) in her maternal aunt.,  reports that she has never smoked. She has never used smokeless tobacco. She reports previous alcohol use. She reports that she does not use drugs. Current Meds  Medication Sig  . doxylamine, Sleep, (UNISOM) 25 MG tablet Take 25 mg by mouth at bedtime as needed.  Marland Kitchen escitalopram (LEXAPRO) 10 MG tablet Take 1 tablet (10 mg total) by mouth daily.  . ondansetron (ZOFRAN ODT) 4 MG disintegrating tablet Take 1 tablet (4 mg total)  by mouth every 6 (six) hours as needed for nausea.  . Prenatal Multivit-Min-Fe-FA (PRENATAL, W/IRON & FA,) 27-0.8 MG TABS Take 1 tablet by mouth daily.  . Pyridoxine HCl (VITAMIN B-6) 250 MG tablet Take 250 mg by mouth daily.  . Also, has No Known Allergies..  Review of Systems  All other systems reviewed and are negative.   Physical Exam:  BP 120/80   Ht 5\' 10"  (1.778 m)   Wt 221 lb (100.2 kg)   LMP 05/07/2019 (Exact Date)   BMI 31.71 kg/m  Body mass index is 31.71 kg/m. Constitutional: Well nourished, well developed female in no acute distress.  Abdomen: diffusely non tender to palpation, non distended, and no masses, hernias Neuro: Grossly intact Psych:  Normal mood and affect.    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 Options for management discussed. Info provided. Pt desires to avoid cesarean section, even with these new found risks explained and discussed in detail.  She feels this has been present for a long time and her risks should be low.  I cannot quantitate her risk or know any better than the neurologist.  I cannot monitor her in labor to know if there are changes or worsening of her brain condition.  Risks of stroke, brain bleed, death are possible.  Risks w pushing, straining, raised blood pressures, and the like in labor may increase these risks, compared to elective cesarean section.  Pt receives informed consent with this in depth discussion, and continues to desire and plan for labor and vaginal delivery expectations. Will pass along to the call team as well for her delivery planning.  I will not schdule a CS at this time as patient refuses this plan of management.  A total of 30 minutes were spent face-to-face with the patient as well as preparation, review, communication, and documentation during this encounter.    Annamarie Major, MD,  [facility], [facility] 02/02/2020  11:03 AM

## 2020-02-03 ENCOUNTER — Encounter: Payer: BC Managed Care – PPO | Admitting: Obstetrics

## 2020-02-03 ENCOUNTER — Inpatient Hospital Stay: Payer: BC Managed Care – PPO

## 2020-02-03 ENCOUNTER — Encounter: Payer: Self-pay | Admitting: *Deleted

## 2020-02-03 LAB — SARS CORONAVIRUS 2 (TAT 6-24 HRS): SARS Coronavirus 2: NEGATIVE

## 2020-02-03 NOTE — Telephone Encounter (Signed)
Left detailed msg for [place].

## 2020-02-03 NOTE — Telephone Encounter (Signed)
Sandy from Dr. Roney Mans Vaught's office calling; left msg on the 8th and 17th and now today; asking if we received the clearance for taking Gavascon d/t reflux; has it been taken care of?  [phone]

## 2020-02-04 ENCOUNTER — Encounter
Admission: RE | Disposition: A | Discharge status: Home / Self Care | Payer: Self-pay | Source: Home / Self Care | Attending: Obstetrics and Gynecology

## 2020-02-04 ENCOUNTER — Other Ambulatory Visit: Payer: Self-pay

## 2020-02-04 ENCOUNTER — Encounter: Payer: Self-pay | Admitting: Obstetrics and Gynecology

## 2020-02-04 ENCOUNTER — Inpatient Hospital Stay: Payer: BC Managed Care – PPO | Admitting: Certified Registered"

## 2020-02-04 ENCOUNTER — Inpatient Hospital Stay
Admission: RE | Admit: 2020-02-04 | Discharge: 2020-02-07 | DRG: 786 | Disposition: A | Discharge status: Home / Self Care | Payer: BC Managed Care – PPO | Attending: Obstetrics and Gynecology | Admitting: Obstetrics and Gynecology

## 2020-02-04 DIAGNOSIS — Z3A39 39 weeks gestation of pregnancy: Secondary | ICD-10-CM

## 2020-02-04 DIAGNOSIS — Z20822 Contact with and (suspected) exposure to covid-19: Secondary | ICD-10-CM | POA: Diagnosis present

## 2020-02-04 DIAGNOSIS — O134 Gestational [pregnancy-induced] hypertension without significant proteinuria, complicating childbirth: Principal | ICD-10-CM | POA: Diagnosis present

## 2020-02-04 DIAGNOSIS — E871 Hypo-osmolality and hyponatremia: Secondary | ICD-10-CM | POA: Diagnosis present

## 2020-02-04 DIAGNOSIS — O9081 Anemia of the puerperium: Secondary | ICD-10-CM | POA: Diagnosis not present

## 2020-02-04 DIAGNOSIS — Z6791 Unspecified blood type, Rh negative: Secondary | ICD-10-CM | POA: Diagnosis not present

## 2020-02-04 DIAGNOSIS — R001 Bradycardia, unspecified: Secondary | ICD-10-CM | POA: Diagnosis present

## 2020-02-04 DIAGNOSIS — O99353 Diseases of the nervous system complicating pregnancy, third trimester: Secondary | ICD-10-CM | POA: Diagnosis not present

## 2020-02-04 DIAGNOSIS — O26893 Other specified pregnancy related conditions, third trimester: Secondary | ICD-10-CM | POA: Diagnosis present

## 2020-02-04 DIAGNOSIS — O99284 Endocrine, nutritional and metabolic diseases complicating childbirth: Secondary | ICD-10-CM | POA: Diagnosis present

## 2020-02-04 DIAGNOSIS — I9789 Other postprocedural complications and disorders of the circulatory system, not elsewhere classified: Secondary | ICD-10-CM | POA: Diagnosis not present

## 2020-02-04 DIAGNOSIS — R55 Syncope and collapse: Secondary | ICD-10-CM | POA: Diagnosis present

## 2020-02-04 DIAGNOSIS — R930 Abnormal findings on diagnostic imaging of skull and head, not elsewhere classified: Secondary | ICD-10-CM | POA: Diagnosis not present

## 2020-02-04 DIAGNOSIS — O99892 Other specified diseases and conditions complicating childbirth: Secondary | ICD-10-CM | POA: Diagnosis present

## 2020-02-04 DIAGNOSIS — D62 Acute posthemorrhagic anemia: Secondary | ICD-10-CM | POA: Diagnosis not present

## 2020-02-04 DIAGNOSIS — E669 Obesity, unspecified: Secondary | ICD-10-CM | POA: Diagnosis present

## 2020-02-04 DIAGNOSIS — G935 Compression of brain: Secondary | ICD-10-CM | POA: Diagnosis present

## 2020-02-04 DIAGNOSIS — O99214 Obesity complicating childbirth: Secondary | ICD-10-CM | POA: Diagnosis present

## 2020-02-04 DIAGNOSIS — O09299 Supervision of pregnancy with other poor reproductive or obstetric history, unspecified trimester: Secondary | ICD-10-CM

## 2020-02-04 DIAGNOSIS — O26899 Other specified pregnancy related conditions, unspecified trimester: Secondary | ICD-10-CM

## 2020-02-04 DIAGNOSIS — D509 Iron deficiency anemia, unspecified: Secondary | ICD-10-CM | POA: Diagnosis not present

## 2020-02-04 DIAGNOSIS — O99213 Obesity complicating pregnancy, third trimester: Secondary | ICD-10-CM

## 2020-02-04 DIAGNOSIS — O99210 Obesity complicating pregnancy, unspecified trimester: Secondary | ICD-10-CM | POA: Diagnosis present

## 2020-02-04 DIAGNOSIS — O0990 Supervision of high risk pregnancy, unspecified, unspecified trimester: Secondary | ICD-10-CM

## 2020-02-04 DIAGNOSIS — D72829 Elevated white blood cell count, unspecified: Secondary | ICD-10-CM | POA: Diagnosis not present

## 2020-02-04 LAB — CBC
HCT: 38.4 % (ref 36.0–46.0)
Hemoglobin: 12.5 g/dL (ref 12.0–15.0)
MCH: 30.6 pg (ref 26.0–34.0)
MCHC: 32.6 g/dL (ref 30.0–36.0)
MCV: 94.1 fL (ref 80.0–100.0)
Platelets: 187 10*3/uL (ref 150–400)
RBC: 4.08 MIL/uL (ref 3.87–5.11)
RDW: 19.3 % — ABNORMAL HIGH (ref 11.5–15.5)
WBC: 5.4 10*3/uL (ref 4.0–10.5)
nRBC: 0 % (ref 0.0–0.2)

## 2020-02-04 LAB — COMPREHENSIVE METABOLIC PANEL
ALT: 22 U/L (ref 0–44)
AST: 29 U/L (ref 15–41)
Albumin: 3.1 g/dL — ABNORMAL LOW (ref 3.5–5.0)
Alkaline Phosphatase: 105 U/L (ref 38–126)
Anion gap: 10 (ref 5–15)
BUN: 9 mg/dL (ref 6–20)
CO2: 22 mmol/L (ref 22–32)
Calcium: 8.6 mg/dL — ABNORMAL LOW (ref 8.9–10.3)
Chloride: 101 mmol/L (ref 98–111)
Creatinine, Ser: 0.62 mg/dL (ref 0.44–1.00)
GFR, Estimated: >60 mL/min (ref >60)
Glucose, Bld: 108 mg/dL — ABNORMAL HIGH (ref 70–99)
Potassium: 3.5 mmol/L (ref 3.5–5.1)
Sodium: 133 mmol/L — ABNORMAL LOW (ref 135–145)
Total Bilirubin: 0.7 mg/dL (ref 0.3–1.2)
Total Protein: 6.6 g/dL (ref 6.5–8.1)

## 2020-02-04 LAB — SAMPLE TO BLOOD BANK

## 2020-02-04 LAB — TYPE AND SCREEN
ABO/RH(D): O NEG
Antibody Screen: POSITIVE

## 2020-02-04 LAB — PROTEIN / CREATININE RATIO, URINE
Creatinine, Urine: 385 mg/dL
Protein Creatinine Ratio: 0.09 mg/mg{Cre} (ref 0.00–0.15)
Total Protein, Urine: 35 mg/dL

## 2020-02-04 SURGERY — Surgical Case
Anesthesia: General

## 2020-02-04 MED ORDER — SEVOFLURANE IN SOLN
RESPIRATORY_TRACT | Status: AC
  Filled 2020-02-04: qty 250

## 2020-02-04 MED ORDER — OXYTOCIN-SODIUM CHLORIDE 30-0.9 UT/500ML-% IV SOLN
2.5000 [IU]/h | INTRAVENOUS | Status: DC
  Administered 2020-02-04: 50 [IU]/h via INTRAVENOUS
  Filled 2020-02-04 (×2): qty 500

## 2020-02-04 MED ORDER — COCONUT OIL OIL: 1.0000 "application " | TOPICAL_OIL | Status: DC | PRN

## 2020-02-04 MED ORDER — MENTHOL 3 MG MT LOZG
1.0000 | LOZENGE | OROMUCOSAL | Status: DC | PRN
  Filled 2020-02-04: qty 9

## 2020-02-04 MED ORDER — PROPOFOL 500 MG/50ML IV EMUL
INTRAVENOUS | Status: AC
  Filled 2020-02-04: qty 50

## 2020-02-04 MED ORDER — WITCH HAZEL-GLYCERIN EX PADS: 1.0000 "application " | MEDICATED_PAD | CUTANEOUS | Status: DC | PRN

## 2020-02-04 MED ORDER — DEXAMETHASONE SODIUM PHOSPHATE 10 MG/ML IJ SOLN
INTRAMUSCULAR | Status: AC
  Filled 2020-02-04: qty 3

## 2020-02-04 MED ORDER — FENTANYL CITRATE (PF) 100 MCG/2ML IJ SOLN
INTRAMUSCULAR | Status: AC
  Filled 2020-02-04: qty 2

## 2020-02-04 MED ORDER — OXYTOCIN-SODIUM CHLORIDE 30-0.9 UT/500ML-% IV SOLN
2.5000 [IU]/h | INTRAVENOUS | Status: AC
  Filled 2020-02-04 (×2): qty 500

## 2020-02-04 MED ORDER — SOD CITRATE-CITRIC ACID 500-334 MG/5ML PO SOLN
30.0000 mL | ORAL | Status: DC | PRN
  Administered 2020-02-04: 30 mL via ORAL
  Filled 2020-02-04: qty 15

## 2020-02-04 MED ORDER — ONDANSETRON HCL 4 MG/2ML IJ SOLN: 4.0000 mg | Freq: Four times a day (QID) | INTRAMUSCULAR | Status: DC | PRN

## 2020-02-04 MED ORDER — HYDROMORPHONE HCL 1 MG/ML IJ SOLN
INTRAMUSCULAR | Status: DC | PRN
Start: 2020-02-04 — End: 2020-02-04
  Administered 2020-02-04 (×2): .5 mg via INTRAVENOUS

## 2020-02-04 MED ORDER — PROPOFOL 500 MG/50ML IV EMUL
INTRAVENOUS | Status: DC | PRN
Start: 2020-02-04 — End: 2020-02-04
  Administered 2020-02-04: 50 ug/kg/min via INTRAVENOUS

## 2020-02-04 MED ORDER — MIDAZOLAM HCL 2 MG/2ML IJ SOLN
INTRAMUSCULAR | Status: AC
  Filled 2020-02-04: qty 2

## 2020-02-04 MED ORDER — LACTATED RINGERS IV SOLN: 500.0000 mL | INTRAVENOUS | Status: DC | PRN

## 2020-02-04 MED ORDER — ESMOLOL HCL 100 MG/10ML IV SOLN
INTRAVENOUS | Status: DC | PRN
Start: 2020-02-04 — End: 2020-02-04
  Administered 2020-02-04: 20 mg via INTRAVENOUS
  Administered 2020-02-04: 10 mg via INTRAVENOUS
  Administered 2020-02-04: 20 mg via INTRAVENOUS

## 2020-02-04 MED ORDER — SUCCINYLCHOLINE CHLORIDE 200 MG/10ML IV SOSY
PREFILLED_SYRINGE | INTRAVENOUS | Status: AC
  Filled 2020-02-04: qty 10

## 2020-02-04 MED ORDER — ESMOLOL HCL 100 MG/10ML IV SOLN
INTRAVENOUS | Status: AC
  Filled 2020-02-04: qty 10

## 2020-02-04 MED ORDER — PHENYLEPHRINE HCL (PRESSORS) 10 MG/ML IV SOLN
INTRAVENOUS | Status: DC | PRN
  Administered 2020-02-04 (×2): 100 ug via INTRAVENOUS

## 2020-02-04 MED ORDER — BUPIVACAINE 0.25 % ON-Q PUMP DUAL CATH 400 ML
400.0000 mL | INJECTION | Status: DC
  Filled 2020-02-04: qty 400

## 2020-02-04 MED ORDER — ROCURONIUM BROMIDE 100 MG/10ML IV SOLN
INTRAVENOUS | Status: DC | PRN
  Administered 2020-02-04: 5 mg via INTRAVENOUS

## 2020-02-04 MED ORDER — DIPHENHYDRAMINE HCL 25 MG PO CAPS: 25.0000 mg | ORAL_CAPSULE | Freq: Four times a day (QID) | ORAL | Status: DC | PRN

## 2020-02-04 MED ORDER — OXYCODONE-ACETAMINOPHEN 5-325 MG PO TABS
1.0000 | ORAL_TABLET | ORAL | Status: DC | PRN
  Administered 2020-02-05 – 2020-02-07 (×6): 1 via ORAL
  Filled 2020-02-04 (×7): qty 1

## 2020-02-04 MED ORDER — OXYCODONE-ACETAMINOPHEN 5-325 MG PO TABS
2.0000 | ORAL_TABLET | ORAL | Status: DC | PRN
  Administered 2020-02-06: 2 via ORAL
  Filled 2020-02-04: qty 2

## 2020-02-04 MED ORDER — ONDANSETRON HCL 4 MG/2ML IJ SOLN: 4.0000 mg | Freq: Once | INTRAMUSCULAR | Status: DC | PRN

## 2020-02-04 MED ORDER — PROPOFOL 10 MG/ML IV BOLUS
INTRAVENOUS | Status: DC | PRN
Start: 2020-02-04 — End: 2020-02-04
  Administered 2020-02-04: 150 mg via INTRAVENOUS

## 2020-02-04 MED ORDER — FAMOTIDINE IN NACL 20-0.9 MG/50ML-% IV SOLN
20.0000 mg | Freq: Two times a day (BID) | INTRAVENOUS | Status: DC | PRN
  Administered 2020-02-04: 20 mg via INTRAVENOUS
  Filled 2020-02-04 (×2): qty 50

## 2020-02-04 MED ORDER — SUCCINYLCHOLINE CHLORIDE 20 MG/ML IJ SOLN
INTRAMUSCULAR | Status: DC | PRN
  Administered 2020-02-04: 120 mg via INTRAVENOUS

## 2020-02-04 MED ORDER — FENTANYL CITRATE (PF) 100 MCG/2ML IJ SOLN
INTRAMUSCULAR | Status: DC | PRN
  Administered 2020-02-04: 100 ug via INTRAVENOUS

## 2020-02-04 MED ORDER — PRENATAL MULTIVITAMIN CH
1.0000 | ORAL_TABLET | Freq: Every day | ORAL | Status: DC
  Administered 2020-02-05 – 2020-02-07 (×3): 1 via ORAL
  Filled 2020-02-04 (×3): qty 1

## 2020-02-04 MED ORDER — SOD CITRATE-CITRIC ACID 500-334 MG/5ML PO SOLN: 30.0000 mL | ORAL | Status: DC

## 2020-02-04 MED ORDER — IBUPROFEN 600 MG PO TABS
600.0000 mg | ORAL_TABLET | Freq: Four times a day (QID) | ORAL | Status: DC
  Administered 2020-02-05 – 2020-02-07 (×8): 600 mg via ORAL
  Filled 2020-02-04 (×8): qty 1

## 2020-02-04 MED ORDER — LIDOCAINE HCL 4 % MT SOLN
OROMUCOSAL | Status: DC | PRN
  Administered 2020-02-04: 4 mL via TOPICAL

## 2020-02-04 MED ORDER — BUPIVACAINE HCL (PF) 0.5 % IJ SOLN
INTRAMUSCULAR | Status: DC | PRN
  Administered 2020-02-04: 10 mL

## 2020-02-04 MED ORDER — BUPIVACAINE HCL (PF) 0.5 % IJ SOLN
INTRAMUSCULAR | Status: AC
  Filled 2020-02-04: qty 30

## 2020-02-04 MED ORDER — CEFAZOLIN SODIUM-DEXTROSE 2-4 GM/100ML-% IV SOLN
2.0000 g | INTRAVENOUS | Status: AC
  Administered 2020-02-04: 2 g via INTRAVENOUS
  Filled 2020-02-04: qty 100

## 2020-02-04 MED ORDER — MIDAZOLAM HCL 2 MG/2ML IJ SOLN
INTRAMUSCULAR | Status: DC | PRN
  Administered 2020-02-04: 2 mg via INTRAVENOUS

## 2020-02-04 MED ORDER — KETOROLAC TROMETHAMINE 30 MG/ML IJ SOLN
INTRAMUSCULAR | Status: AC
  Filled 2020-02-04: qty 1

## 2020-02-04 MED ORDER — NITROGLYCERIN IN D5W 200-5 MCG/ML-% IV SOLN
INTRAVENOUS | Status: AC
  Filled 2020-02-04: qty 250

## 2020-02-04 MED ORDER — LACTATED RINGERS IV SOLN: INTRAVENOUS | Status: DC

## 2020-02-04 MED ORDER — HYDROMORPHONE HCL 1 MG/ML IJ SOLN
INTRAMUSCULAR | Status: AC
  Filled 2020-02-04: qty 1

## 2020-02-04 MED ORDER — ONDANSETRON HCL 4 MG/2ML IJ SOLN
INTRAMUSCULAR | Status: AC
  Filled 2020-02-04: qty 6

## 2020-02-04 MED ORDER — SODIUM CHLORIDE (PF) 0.9 % IJ SOLN
INTRAMUSCULAR | Status: AC
  Filled 2020-02-04: qty 10

## 2020-02-04 MED ORDER — KETOROLAC TROMETHAMINE 30 MG/ML IJ SOLN
INTRAMUSCULAR | Status: DC | PRN
  Administered 2020-02-04: 30 mg via INTRAVENOUS

## 2020-02-04 MED ORDER — OXYTOCIN BOLUS FROM INFUSION: 333.0000 mL | Freq: Once | INTRAVENOUS | Status: DC

## 2020-02-04 MED ORDER — LIDOCAINE HCL (PF) 2 % IJ SOLN
INTRAMUSCULAR | Status: AC
  Filled 2020-02-04: qty 5

## 2020-02-04 MED ORDER — OXYTOCIN 10 UNIT/ML IJ SOLN: 10.0000 [IU] | Freq: Once | INTRAMUSCULAR | Status: DC

## 2020-02-04 MED ORDER — LIDOCAINE HCL (CARDIAC) PF 100 MG/5ML IV SOSY
PREFILLED_SYRINGE | INTRAVENOUS | Status: DC | PRN
  Administered 2020-02-04: 100 mg via INTRAVENOUS

## 2020-02-04 MED ORDER — DIBUCAINE (PERIANAL) 1 % EX OINT: 1.0000 "application " | TOPICAL_OINTMENT | CUTANEOUS | Status: DC | PRN

## 2020-02-04 MED ORDER — PROPOFOL 10 MG/ML IV BOLUS
INTRAVENOUS | Status: AC
  Filled 2020-02-04: qty 20

## 2020-02-04 MED ORDER — BUPIVACAINE HCL (PF) 0.5 % IJ SOLN: 5.0000 mL | Freq: Once | INTRAMUSCULAR | Status: DC

## 2020-02-04 MED ORDER — KETOROLAC TROMETHAMINE 15 MG/ML IJ SOLN
15.0000 mg | Freq: Four times a day (QID) | INTRAMUSCULAR | Status: AC
  Administered 2020-02-05 (×3): 15 mg via INTRAVENOUS
  Filled 2020-02-04 (×3): qty 1

## 2020-02-04 MED ORDER — DEXAMETHASONE SODIUM PHOSPHATE 10 MG/ML IJ SOLN
INTRAMUSCULAR | Status: DC | PRN
Start: 2020-02-04 — End: 2020-02-04
  Administered 2020-02-04: 10 mg via INTRAVENOUS

## 2020-02-04 MED ORDER — ONDANSETRON HCL 4 MG/2ML IJ SOLN
INTRAMUSCULAR | Status: DC | PRN
  Administered 2020-02-04: 4 mg via INTRAVENOUS

## 2020-02-04 MED ORDER — KETAMINE HCL 50 MG/ML IJ SOLN
INTRAMUSCULAR | Status: AC
  Filled 2020-02-04: qty 10

## 2020-02-04 MED ORDER — FENTANYL CITRATE (PF) 100 MCG/2ML IJ SOLN
25.0000 ug | INTRAMUSCULAR | Status: DC | PRN
  Administered 2020-02-04 (×4): 25 ug via INTRAVENOUS

## 2020-02-04 MED ORDER — SENNOSIDES-DOCUSATE SODIUM 8.6-50 MG PO TABS
2.0000 | ORAL_TABLET | ORAL | Status: DC
  Administered 2020-02-05 – 2020-02-06 (×2): 2 via ORAL
  Filled 2020-02-04 (×3): qty 2

## 2020-02-04 MED ORDER — LIDOCAINE HCL (PF) 1 % IJ SOLN: 30.0000 mL | INTRAMUSCULAR | Status: DC | PRN

## 2020-02-04 MED ORDER — SODIUM CHLORIDE (PF) 0.9 % IJ SOLN
INTRAMUSCULAR | Status: AC
  Filled 2020-02-04: qty 20

## 2020-02-04 MED ORDER — FERROUS SULFATE 325 (65 FE) MG PO TABS
325.0000 mg | ORAL_TABLET | Freq: Two times a day (BID) | ORAL | Status: DC
  Administered 2020-02-05 – 2020-02-07 (×5): 325 mg via ORAL
  Filled 2020-02-04 (×5): qty 1

## 2020-02-04 MED ORDER — SIMETHICONE 80 MG PO CHEW
80.0000 mg | CHEWABLE_TABLET | Freq: Three times a day (TID) | ORAL | Status: DC
  Administered 2020-02-05 – 2020-02-07 (×8): 80 mg via ORAL
  Filled 2020-02-04 (×8): qty 1

## 2020-02-04 MED ORDER — HYDROMORPHONE HCL 1 MG/ML IJ SOLN: 0.5000 mg | INTRAMUSCULAR | Status: DC | PRN

## 2020-02-04 SURGICAL SUPPLY — 35 items
CANISTER SUCT 3000ML PPV (MISCELLANEOUS) ×3 IMPLANT
CATH KIT ON-Q SILVERSOAK 5IN (CATHETERS) ×6 IMPLANT
CLOSURE WOUND 1/2 X4 (GAUZE/BANDAGES/DRESSINGS) ×1
COVER WAND RF STERILE (DRAPES) ×3 IMPLANT
DERMABOND ADHESIVE PROPEN (GAUZE/BANDAGES/DRESSINGS) ×2
DERMABOND ADVANCED (GAUZE/BANDAGES/DRESSINGS) ×4
DERMABOND ADVANCED .7 DNX12 (GAUZE/BANDAGES/DRESSINGS) ×2 IMPLANT
DERMABOND ADVANCED .7 DNX6 (GAUZE/BANDAGES/DRESSINGS) ×1 IMPLANT
DRSG OPSITE POSTOP 4X10 (GAUZE/BANDAGES/DRESSINGS) ×3 IMPLANT
DRSG TELFA 3X8 NADH (GAUZE/BANDAGES/DRESSINGS) ×3 IMPLANT
ELECT CAUTERY BLADE 6.4 (BLADE) ×3 IMPLANT
ELECT REM PT RETURN 9FT ADLT (ELECTROSURGICAL) ×3
ELECTRODE REM PT RTRN 9FT ADLT (ELECTROSURGICAL) ×1 IMPLANT
GAUZE SPONGE 4X4 12PLY STRL (GAUZE/BANDAGES/DRESSINGS) ×3 IMPLANT
GLOVE BIO SURGEON STRL SZ7 (GLOVE) ×3 IMPLANT
GLOVE INDICATOR 7.5 STRL GRN (GLOVE) ×3 IMPLANT
GOWN STRL REUS W/ TWL LRG LVL3 (GOWN DISPOSABLE) ×3 IMPLANT
GOWN STRL REUS W/TWL LRG LVL3 (GOWN DISPOSABLE) ×6
MANIFOLD NEPTUNE II (INSTRUMENTS) ×3 IMPLANT
NS IRRIG 1000ML POUR BTL (IV SOLUTION) ×3 IMPLANT
PACK C SECTION AR (MISCELLANEOUS) ×3 IMPLANT
PAD OB MATERNITY 4.3X12.25 (PERSONAL CARE ITEMS) ×6 IMPLANT
PAD PREP 24X41 OB/GYN DISP (PERSONAL CARE ITEMS) ×3 IMPLANT
PENCIL SMOKE ULTRAEVAC 22 CON (MISCELLANEOUS) ×3 IMPLANT
SPONGE LAP 18X18 RF (DISPOSABLE) ×6 IMPLANT
STRIP CLOSURE SKIN 1/2X4 (GAUZE/BANDAGES/DRESSINGS) ×2 IMPLANT
SUT MNCRL 4-0 (SUTURE) ×2
SUT MNCRL 4-0 27XMFL (SUTURE) ×1
SUT PDS AB 1 TP1 96 (SUTURE) ×3 IMPLANT
SUT PLAIN GUT 0 (SUTURE) IMPLANT
SUT VIC AB 0 CTX 36 (SUTURE) ×6
SUT VIC AB 0 CTX36XBRD ANBCTRL (SUTURE) ×3 IMPLANT
SUT VICRYL 3-0 27IN SH (SUTURE) ×6 IMPLANT
SUTURE MNCRL 4-0 27XMF (SUTURE) ×1 IMPLANT
SWABSTK COMLB BENZOIN TINCTURE (MISCELLANEOUS) ×3 IMPLANT

## 2020-02-04 NOTE — Anesthesia Procedure Notes (Signed)
Procedure Name: Intubation Date/Time: 02/04/2020 5:24 PM Performed by: Katherine Basset, CRNA Pre-anesthesia Checklist: Patient identified, Emergency Drugs available, Suction available and Patient being monitored Patient Re-evaluated:Patient Re-evaluated prior to induction Oxygen Delivery Method: Circle system utilized Preoxygenation: Pre-oxygenation with 100% oxygen Induction Type: IV induction, Rapid sequence and Cricoid Pressure applied Ventilation: Mask ventilation without difficulty Laryngoscope Size: McGraph and 3 Grade View: Grade I Tube type: Oral Tube size: 7.0 mm Number of attempts: 1 Airway Equipment and Method: Stylet and Oral airway Placement Confirmation: ETT inserted through vocal cords under direct vision,  positive ETCO2 and breath sounds checked- equal and bilateral Secured at: 20 cm Tube secured with: Tape Dental Injury: Teeth and Oropharynx as per pre-operative assessment

## 2020-02-04 NOTE — H&P (Signed)
OB History & Physical   History of Present Illness:  Chief Complaint: Here for delivery  HPI:  Anna Whitehead is a 34 y.o. G8P1001 female at [redacted]w[redacted]d dated by LMP consistent with [redacted]w[redacted]d ultrasound.  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    She denies contractions.   She denies leakage of fluid.   She denies vaginal bleeding.   She reports fetal movement.   She denies headache, visual changes, and right upper quadrant.    The patient has had a pregnancy complicated by neurologic symptoms of pulsatile tinnitus and several syncopal episodes.  She has been evaluated by ENT who ordered a brain MRI.  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 She was evaluated by neurosurgery at [facility] on 12/20/2019.  It was recommended that she have an MRA/MRV, which was negative for thrombosis.  She was seen by a neurologist on 02/01/2020 who, based on the imaging available and the patient symptoms, recommended delivery by cesarean section.  The patient had been previously counseled regarding the risks of vaginal delivery, which would be elevation in intracranial blood pressure, increasing the cerebrospinal fluid pressure and putting her at risk for worsening herniation.  With this understanding, she presented today for induction of labor.   She initially has had two elevated blood pressures. She also had an elevated blood pressure at her neurology appointment on 02/01/20.  She has had mostly normal blood pressure since that time.    Total weight gain for pregnancy: -8.618 kg   Obstetrical Problem List: Pregnancy#2 Problems (from 05/07/19 to present)    Problem Noted Resolved   Obesity affecting pregnancy 08/29/2019 by Conard Novak, MD No   Rh negative state in antepartum period 08/29/2019 by Conard Novak, MD No   Overview Signed 11/25/2019  3:44 PM by Mirna Mires, CNM    Rhogam 9/10      Supervision of  high risk pregnancy, antepartum 06/16/2019 by Vena Austria, MD No   Overview Addendum 01/16/2020  4:18 PM by Nadara Mustard, MD     Nursing Staff Provider  Office Location  [facility] Dating   LMP = 6 wk [place]  Language  English Anatomy US    Flu Vaccine  10/21 Genetic Screen  NIPS:   AFP:   First Screen:  Declined MaternT  TDaP vaccine   10/21 Early [id number] Early :9/10 Third trimester :   Rhogam  28 weeks   LAB RESULTS   Feeding Plan Breast Blood Type O/Negative/-- (04/05 1019)   Contraception IUD Antibody Negative (04/05 1019)  Circumcision  Rubella 1.89 (04/05 1019)  Pediatrician   RPR Non Reactive (04/05 1019)   Support Person Para March FOB HBsAg Negative (04/05 1019)   Prenatal Classes  HIV Non Reactive (04/05 1019)    Varicella  Immune  BTL Consent  GBS  (For PCN allergy, check sensitivities)        VBAC Consent n/a Pap      Hgb Electro      CF      SMA               Previous Version       Maternal Medical History:   Past Medical History:  Diagnosis Date  . Allergic eczema   . Anxiety   . Depression   . Dysmenorrhea   . Family history of breast cancer    4/21 Cancer genetic testing letter sent  .  Iron deficiency anemia 10/03/2019  . Iron deficiency anemia due to chronic blood loss   . Menorrhagia   . Midline low back pain with left-sided sciatica   . Mild asthma    seasonal, not used recently  . Overweight (BMI 25.0-29.9)   . Right foot pain   . Seasonal allergic rhinitis     Past Surgical History:  Procedure Laterality Date  . NO PAST SURGERIES     Allergies: No Known Allergies  Prior to Admission medications   Medication Sig Start Date End Date Taking? Authorizing Provider  doxylamine, Sleep, (UNISOM) 25 MG tablet Take 25 mg by mouth at bedtime as needed.    [provider]  escitalopram (LEXAPRO) 10 MG tablet Take 1 tablet (10 mg total) by mouth daily. 01/02/20   Mirna MiresFryer, Margaret M, CNM  ondansetron (ZOFRAN ODT) 4 MG disintegrating tablet  Take 1 tablet (4 mg total) by mouth every 6 (six) hours as needed for nausea. 09/21/19   Nadara MustardHarris, Robert P, MD  Prenatal Multivit-Min-Fe-FA (PRENATAL, W/IRON & FA,) 27-0.8 MG TABS Take 1 tablet by mouth daily.    [provider]  Pyridoxine HCl (VITAMIN B-6) 250 MG tablet Take 250 mg by mouth daily.    [provider]    OB History  Gravida Para Term Preterm AB Living  2 1 1     1   SAB TAB Ectopic Multiple Live Births        0 1    # Outcome Date GA Lbr Len/2nd Weight Sex Delivery Anes PTL Lv  2 Current           1 Term 11/14/15 7571w2d 09:30 / 01:40 3620 g M Vag-Spont EPI  LIV    Prenatal care site: [facility]  Social History: She  reports that she has never smoked. She has never used smokeless tobacco. She reports previous alcohol use. She reports that she does not use drugs.  Family History: family history includes Bipolar disorder in her mother; Breast cancer (age of onset: 6140) in her maternal aunt; Breast cancer (age of onset: 7550) in her maternal aunt; Ovarian cancer in her maternal great-grandmother; Pancreatic cancer (age of onset: 5170) in her maternal aunt.   Review of Systems:  Review of Systems  Constitutional: Negative.   HENT: Negative.   Eyes: Negative.   Respiratory: Negative.   Cardiovascular: Negative.   Gastrointestinal: Negative.   Genitourinary: Negative.   Musculoskeletal: Negative.   Skin: Negative.   Neurological: Negative.   Psychiatric/Behavioral: Negative.      Physical Exam:  BP 118/83   Pulse 84   Temp 98 F (36.7 C) (Oral)   Resp 19   Ht 5\' 10"  (1.778 m)   Wt 100.2 kg   LMP 05/07/2019 (Exact Date)   BMI 31.71 kg/m   BPs: 126/94, 133/97, 110/82, 106/76, 116/80, 120/87, 113/85, 119/75, 118/83, 115/90, 111/79, 125/91 Physical Exam Constitutional:      General: She is not in acute distress.    Appearance: Normal appearance. She is well-developed.  Genitourinary:     Genitourinary Comments: SVE: 2/60/-3  HENT:     Head:  Normocephalic and atraumatic.  Eyes:     General: No scleral icterus.    Conjunctiva/sclera: Conjunctivae normal.  Cardiovascular:     Rate and Rhythm: Normal rate and regular rhythm.     Heart sounds: No murmur heard.  No friction rub. No gallop.   Pulmonary:     Effort: Pulmonary effort is normal. No respiratory distress.  Breath sounds: Normal breath sounds. No wheezing or rales.  Abdominal:     General: Bowel sounds are normal. There is no distension.     Palpations: Abdomen is soft. There is mass (gravid, NT).     Tenderness: There is no abdominal tenderness. There is no guarding or rebound.  Musculoskeletal:        General: Normal range of motion.     Cervical back: Normal range of motion and neck supple.  Neurological:     General: No focal deficit present.     Mental Status: She is alert and oriented to person, place, and time.     Cranial Nerves: No cranial nerve deficit.  Skin:    General: Skin is warm and dry.     Findings: No erythema.  Psychiatric:        Mood and Affect: Mood normal.        Behavior: Behavior normal.        Judgment: Judgment normal.   Female chaperone present for pelvic exam:    Baseline FHR: 110 beats/min   Variability: moderate   Accelerations: present   Decelerations: absent Contractions: present frequency: irritability Overall assessment: cat 1  Lab Results  Component Value Date   SARSCOV2NAA NEGATIVE 02/02/2020   Lab Results  Component Value Date   WBC 5.4 02/04/2020   HGB 12.5 02/04/2020   HCT 38.4 02/04/2020   PLT 187 02/04/2020   CREATININE 0.62 02/04/2020   ALT 22 02/04/2020   AST 29 02/04/2020   PROTCRRATIO 0.09 02/04/2020      Assessment:  Anna Whitehead is a 34 y.o. 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 She also has mild gestational hypertension.   Plan:  1. Admit to Labor & Delivery  2. CBC, T&S, NPO, IVF 3. GBS negative.   4. [place] well-being:  reassuring 5. Gestational hypertension: Mild, continue to monitor. 6. 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 Dohmeier). The neurologist recommends delivery by c-section due to risk of increased blood pressure spikes during labor and pushing.  I spoke with [facility] anesthesia who was concerned about placing either an epidural or a spinal (in the case of a c-section).  So, I called the neurologist on call.  Dr. Iver Nestle who is a Neurologist briefly reviewed the patient's situation for me and I spoke with her at length.  She states that spinal anesthesia is a risk factor for decreased intracranial pressure and therefore herniation and does not recommend.  Epidural anesthesia would be acceptable. However, with risk of dural puncture during this procedure, it should be undertaken with care.  Otherwise, general anesthesia for c-section or laboring without neuraxial analgesia is her only option, which is the worst case due to the fact that the patient would likely have the most pain.  I spoke with the [facility] MFM on call (Dr. Parke Poisson), who concurred with c-section under general anesthesia.  I spoke with Dr. Leatha Gilding at [facility] MFM who discussed the case with [facility] anesthesia.  The [facility] anesthesiologist agreed that she would likely require a c-section under general anesthesia, based on the assessment of the neurologist.  I discussed my concern with the [facility] MFM that if there is a neurologic event during labor that I have no resources to assess or treat the issue.  So, if the patient desired to labor, Dr. Leatha Gilding at [facility] stated that [facility] would be willing to take the patient for  labor as they have access to a neuro-ICU in the building.  I also asked whether she believed the risk of c-section at [facility] would be reasonable or, from a neurologic standpoint, should the c-section be accomplished at a center with a neuro-ICU.  She states that she would feel comfortable  delivering the patient at [facility] by c-section under general anesthesia.   I have discussed all of the above with the patient.  I discussed that if she would like to attempt labor that I could transfer her to [facility]. The benefit of this transfer would be access to the neuro-ICU should she need that level of care.  However, it seems like [facility], based on my conversation with Dr. Leatha Gilding, would also recommend delivery by c-section under general anesthesia.  After much consideration, the patient agrees to proceed with c-section under general anesthesia.  The consideration of whether to keep her here at all versus transferring her to another facility has come up.  Based on as much information as I can gather, I believe she should be safe to deliver at our facility with a c-section under general anesthesia.  I have let her know that I can not guarantee that no neurologic events would happen. However, I believe the risk should be very low.  She has been offered transfer to [facility] for c-section just in case and she declines as even though I can't tell her a percentage of risk that she would have, it appears to be very low based on all the information that I can gather.   She has been consented for c-section and we will proceed when possible. We discussed the particular risks associated with c-section under general anesthesia.    [name], MD 02/04/2020 1:10 PM

## 2020-02-04 NOTE — Anesthesia Preprocedure Evaluation (Signed)
Anesthesia Evaluation  Patient identified by MRN, date of birth, ID band Patient awake    Reviewed: Allergy & Precautions, NPO status , Patient's Chart, lab work & pertinent test results  History of Anesthesia Complications Negative for: history of anesthetic complications  Airway Mallampati: II       Dental   Pulmonary asthma (no inhalers in years) ,           Cardiovascular (-) hypertension(-) Past MI and (-) CHF (-) dysrhythmias (-) Valvular Problems/Murmurs     Neuro/Psych neg Seizures Anxiety Depression  Neuromuscular disease (Pt with temporal lobe herniation)    GI/Hepatic Neg liver ROS, neg GERD  ,  Endo/Other  neg diabetes  Renal/GU negative Renal ROS     Musculoskeletal   Abdominal   Peds  Hematology  (+) anemia ,   Anesthesia Other Findings   Reproductive/Obstetrics (+) Pregnancy                             Anesthesia Physical Anesthesia Plan  ASA: III and emergent  Anesthesia Plan: General   Post-op Pain Management:    Induction: Intravenous  PONV Risk Score and Plan: 3 and Dexamethasone and Ondansetron  Airway Management Planned: Oral ETT  Additional Equipment:   Intra-op Plan:   Post-operative Plan:   Informed Consent: I have reviewed the patients History and Physical, chart, labs and discussed the procedure including the risks, benefits and alternatives for the proposed anesthesia with the patient or authorized representative who has indicated his/her understanding and acceptance.       Plan Discussed with:   Anesthesia Plan Comments: ( Spoke with pt regarding increased risk from temporal lobe herniation. Pt aware that neuraxial techniques are contraindicated with ? Increased ICP. Neurology recommends C-section with GETA with stable BP management to keep ICP stable. Pt understands and desires to proceed. )        Anesthesia Quick Evaluation

## 2020-02-04 NOTE — Transfer of Care (Signed)
Immediate Anesthesia Transfer of Care Note  Patient: Anna Whitehead  Procedure(s) Performed: CESAREAN SECTION  Patient Location: PACU  Anesthesia Type:General  Level of Consciousness: awake, alert  and oriented  Airway & Oxygen Therapy: Patient Spontanous Breathing and Patient connected to face mask oxygen  Post-op Assessment: Report given to RN and Post -op Vital signs reviewed and stable  Post vital signs: Reviewed and stable  Last Vitals:  Vitals Value Taken Time  BP 126/95 02/04/20 1839  Temp 36.2 C 02/04/20 1839  Pulse 74 02/04/20 1844  Resp 11 02/04/20 1844  SpO2 99 % 02/04/20 1844  Vitals shown include unvalidated device data.  Last Pain:  Vitals:   02/04/20 1839  TempSrc:   PainSc: 4          Complications: No complications documented.

## 2020-02-04 NOTE — Discharge Summary (Signed)
Postpartum Discharge Summary    Patient Name: Anna Whitehead DOB: Nov 11, 1985 MRN: [id number]  Date of admission: 02/04/2020 Delivery date:02/04/2020  Delivering provider: Prentice Docker D  Date of discharge: 02/07/2020  Admitting diagnosis: Labor and delivery, indication for care [O75.9] Intrauterine pregnancy: [redacted]w[redacted]d     Secondary diagnosis:  Active Problems:   Supervision of high risk pregnancy, antepartum   Hx of preeclampsia, prior pregnancy, currently pregnant   Obesity affecting pregnancy   Rh negative state in antepartum period   Labor and delivery, indication for care   Abnormal MRI of head   [redacted] weeks gestation of pregnancy   Bradycardia following surgery  Additional problems: none    Discharge diagnosis: Term Pregnancy Delivered and Gestational Hypertension                                              Post partum procedures:ECHO Augmentation: N/A Complications: None  Hospital course:  The patient presented to the hospital for possible induction of labor.  See H&P for discussion and details. Due to her brain lesion, she went to the OR on 02/07/2020 for a primary cesarean delivery, which occurred without difficulty.  She had a routine postpartum course other than syncope and bradycardia, for which cardiology has been investigating and has he on monitoring at the outpatient level.  She will continue to see hematology for infusion therapy related to anemia.  She will follow up with Neurology at [facility] for the concerns related to her brain/herniation.  She will abide by the post op recommendations w activity restrictions and wound care.  Magnesium Sulfate received: No BMZ received: No Rhophylac:No MMR:No T-DaP:Given prenatally 12/05/2019 Flu: Yes, given 11/25/2019 Transfusion:No  Physical exam  Vitals:   02/07/20 0700 02/07/20 0800 02/07/20 0900 02/07/20 1000  BP:  113/81    Pulse: (!) 46 60 75 75  Resp: 15 17 (!) 22 11  Temp:  97.9 F (36.6 C)    TempSrc:  Axillary     SpO2: 96% 99% 98% 96%  Weight:      Height:       General: alert, cooperative and no distress Lochia: appropriate Uterine Fundus: firm Incision: Healing well with no significant drainage DVT Evaluation: No evidence of DVT seen on physical exam. Labs: Lab Results  Component Value Date   WBC 8.7 02/07/2020   HGB 10.6 (L) 02/07/2020   HCT 33.1 (L) 02/07/2020   MCV 95.9 02/07/2020   PLT 155 02/07/2020   CMP Latest Ref Rng & Units 02/07/2020  Glucose 70 - 99 mg/dL 79  BUN 6 - 20 mg/dL 6  Creatinine 0.44 - 1.00 mg/dL 0.80  Sodium 135 - 145 mmol/L 136  Potassium 3.5 - 5.1 mmol/L 4.3  Chloride 98 - 111 mmol/L 103  CO2 22 - 32 mmol/L 27  Calcium 8.9 - 10.3 mg/dL 8.1(L)  Total Protein 6.5 - 8.1 g/dL -  Total Bilirubin 0.3 - 1.2 mg/dL -  Alkaline Phos 38 - 126 U/L -  AST 15 - 41 U/L -  ALT 0 - 44 U/L -   Edinburgh Score: Edinburgh Postnatal Depression Scale Screening Tool 02/05/2020  I have been able to laugh and see the funny side of things. 1  I have looked forward with enjoyment to things. 1  I have blamed myself unnecessarily when things went wrong. 2  I have been anxious or  worried for no good reason. 3  I have felt scared or panicky for no good reason. 2  Things have been getting on top of me. 2  I have been so unhappy that I have had difficulty sleeping. 1  I have felt sad or miserable. 2  I have been so unhappy that I have been crying. 2  The thought of harming myself has occurred to me. 0  Edinburgh Postnatal Depression Scale Total 16      After visit meds:  Allergies as of 02/07/2020   No Known Allergies     Medication List    TAKE these medications   doxylamine (Sleep) 25 MG tablet Commonly known as: UNISOM Take 25 mg by mouth at bedtime as needed.   escitalopram 20 MG tablet Commonly known as: LEXAPRO Take 1 tablet (20 mg total) by mouth daily. What changed:   medication strength  how much to take   ondansetron 4 MG disintegrating  tablet Commonly known as: Zofran ODT Take 1 tablet (4 mg total) by mouth every 6 (six) hours as needed for nausea.   oxyCODONE-acetaminophen 5-325 MG tablet Commonly known as: PERCOCET/ROXICET Take 1 tablet by mouth every 4 (four) hours as needed for moderate pain (pain score 4-7/10).   Prenatal (w/Iron & FA) 27-0.8 MG Tabs Take 1 tablet by mouth daily.   vitamin B-6 250 MG tablet Take 250 mg by mouth daily.            Discharge Care Instructions  (From admission, onward)         Start     Ordered   02/07/20 0000  If the dressing is still on your incision site when you go home, remove it on the third day after your surgery date. Remove dressing if it begins to fall off, or if it is dirty or damaged before the third day.        02/07/20 1104           Discharge home in stable condition Infant Feeding: Breast Infant Disposition:home with mother Discharge instruction: per After Visit Summary and Postpartum booklet. Activity: Advance as tolerated. Pelvic rest for 6 weeks.  Diet: routine diet Anticipated Birth Control: IUD  Additional Postpartum F/U: Incision check 1 week Future Appointments: Future Appointments  Date Time Provider [place]  02/15/2020 11:10 AM Will Bonnet, MD [facility] None  02/27/2020  8:15 AM Dahlia Client, RPSGT [facility] None  03/01/2020 10:05 AM Theora Gianotti, NP [facility]  03/08/2020  2:45 PM [facility] None  03/12/2020  2:15 PM Earlie Server, MD [facility] None  03/13/2020  2:00 PM [facility] INFUSION CHAIR 6 [facility] None  04/18/2020  9:00 AM Dohmeier, Asencion Partridge, MD [facility] None   Follow up Visit:  Follow-up Information    Will Bonnet, MD. Go on 02/15/2020.   Specialty: Obstetrics and Gynecology Why: Postop incision and mood check @ 11 am [place] information: [place] [address] [phone]        Theora Gianotti, NP. Go on  03/01/2020.   Specialties: Nurse Practitioner, Cardiology, Radiology Why: follow up @ 10:05 am Contact information: [address] [place] [address] [phone]              SIGNED:  Barnett Applebaum, MD, [facility], [facility] 02/07/2020  11:07 AM

## 2020-02-04 NOTE — Progress Notes (Signed)
Dr. Jean Rosenthal notified of Pt. U/O of 200cc since arrival to floor as well as 900cc P.O. and Pt. Desire to have Urinary Catheter removed. Pt. VS also reported. Dr. Jean Rosenthal stated Catheter could be removed when Pt. Is able to stand at side of bed. Pt.informed and v/o.

## 2020-02-04 NOTE — Anesthesia Postprocedure Evaluation (Signed)
Anesthesia Post Note  Patient: Anna Whitehead  Procedure(s) Performed: CESAREAN SECTION  Patient location during evaluation: PACU Anesthesia Type: General Level of consciousness: awake and alert Pain management: pain level controlled Vital Signs Assessment: post-procedure vital signs reviewed and stable Respiratory status: spontaneous breathing and respiratory function stable Cardiovascular status: stable Anesthetic complications: no   No complications documented.   Last Vitals:  Vitals:   02/04/20 1839 02/04/20 1850  BP: (!) 126/95   Pulse: 79 (!) 57  Resp: 12 13  Temp: (!) 36.2 C   SpO2: 99% 98%    Last Pain:  Vitals:   02/04/20 1839  TempSrc:   PainSc: 4                  Curry Seefeldt K

## 2020-02-04 NOTE — Plan of Care (Addendum)
I was asked to comment on patient's desired labor plan in the setting of left temporal lobe lesion of unclear significance.  Please see neurology outpatient note from 11/17 and Dr. Patsey Berthold evaluation from 11/5 for further details.  In further discussion documented with outpatient OB/GYN on 11/18, patient strongly desires vaginal labor and delivery despite potential risks of this lesion.  Regarding anesthesia options for vaginal labor and delivery, spinal anesthesia, given the dural space is accessed, increases risk for potential herniation due to CSF exit creating a pressure differential.  Uncontrolled pain may also increase risk due to increasing intracranial pressure due to prolonged blood pressure increases.  There are no data to specifically quantify the risk for this specific lesion, and the risks of already been extensively discussed with patient earlier, thus I am not doing a full evaluation of these risks.  Epidural anesthesia would be safer than spinal anesthesia, the risk of dural puncture remains and should be discussed with the patient and anesthesiologist along with the risk of uncontrolled pain to arrive at a shared decision.  Brooke Dare MD-PhD [facility] [phone]   No charge.

## 2020-02-04 NOTE — Op Note (Signed)
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  Contraindicated in vaginal delivery according to Neurology 2) intrauterine pregnancy at [redacted]w[redacted]d    Procedure: Primary Low Transverse Cesarean Section via Pfannenstiel incision with double layer uterine closure  Surgeon: Surgeon(s) and Role:    * Conard Novak, MD - Primary   Assistants: Margaretmary Eddy, CNM; No other capable assistant available, in surgery requiring high level assistant.  Anesthesia: general   Findings:  1) normal appearing gravid uterus, fallopian tubes, and ovaries 2) viable female infant with weight 3,560 grams, APGARs 8 and 9   Quantified Blood Loss: 920 mL  Total IV Fluids: 500 ml   Urine Output: 50 mL clear urine at end of case  Specimens: none  Complications: no complications  Disposition: PACU - hemodynamically stable.   Maternal Condition: stable   Baby condition / location:  Couplet care / Skin to Skin  Procedure Details:  The patient was seen in the Holding Room. The risks, benefits, complications, treatment options, and expected outcomes were discussed with the patient. The patient concurred with the proposed plan, giving informed consent. identified as Anna Whitehead and the procedure verified as C-Section Delivery. A Time Out was held and the above information confirmed.   After induction of anesthesia, the patient was draped and prepped in the usual sterile manner. A Pfannenstiel incision was made and carried down through the subcutaneous tissue to the fascia. Fascial incision was made and extended transversely. The fascia was separated from the underlying  rectus tissue superiorly and inferiorly. The peritoneum was identified and entered. Peritoneal incision was extended longitudinally. The bladder flap was not bluntly or sharply freed from the lower uterine segment. A low transverse uterine incision was made and the hysterotomy was extended with cranial-caudal tension. Delivered from cephalic presentation was a 3,560 gram Living newborn infant(s) or Female with Apgar scores of 8 at one minute and 9 at five minutes. Cord ph was not sent the umbilical cord was clamped and cut cord blood was obtained for evaluation. The placenta was removed Intact and appeared normal. The uterine outline, tubes and ovaries appeared normal. The uterine incision was closed with running locked sutures of 0 Vicryl.  A second layer of the same suture was thrown in an imbricating fashion.  Hemostasis was assured.  The uterus was returned to the abdomen and the paracolic gutters were cleared of all clots and debris.  The rectus muscles were inspected and found to be hemostatic.  The On-Q catheter pumps were inserted in accordance with the manufacturer's recommendations.  The catheters were inserted approximately 4cm cephelad to the incision line, approximately 1cm apart, straddling the midline.  They were inserted to a depth of the 4th mark. They were positioned superficial to the rectus abdominus muscles and deep to the rectus fascia.    The fascia was then reapproximated with running sutures of 1-0 PDS, looped. The subcutaneous tissue was re approximated using interrupted 3-0 Vicryl stitches to reduce tension on the skin closure. The subcuticular closure was performed using 4-0 monocryl. The skin closure was reinforced using surgical skin glue.  The On-Q catheters were bolused with 5 mL of 0.5% marcaine plain for a total  of 10 mL.  The catheters were affixed to the skin with surgical skin glue, steri-strips, and tegaderm.    The surgical assistant performed tissue retraction,  assistance with suturing, and fundal pressure.    Instrument, sponge, and needle counts were correct prior the abdominal closure and were correct at the conclusion of the case.  The patient received Ancef 2 gram IV prior to skin incision (within 30 minutes). For VTE prophylaxis she was wearing SCDs throughout the case.  The assistant surgeon was a CNM due to lack of availability of another Sales promotion account executive.   Signed: Conard Novak, MD 02/04/2020 6:44 PM

## 2020-02-04 NOTE — Plan of Care (Signed)
Transferred to Room 340. Alert and Oriented with pleasant affect. Color sl. Pale, Skin w&d, BBS clear. Assessment and VS WNL> Oriented to Room, POC and Safety and Security as well as Fall Prevention and Pt. V/O.

## 2020-02-04 NOTE — Plan of Care (Signed)
PT. Is PP

## 2020-02-05 ENCOUNTER — Encounter: Payer: Self-pay | Admitting: Obstetrics and Gynecology

## 2020-02-05 DIAGNOSIS — D509 Iron deficiency anemia, unspecified: Secondary | ICD-10-CM

## 2020-02-05 DIAGNOSIS — D72829 Elevated white blood cell count, unspecified: Secondary | ICD-10-CM

## 2020-02-05 DIAGNOSIS — I9789 Other postprocedural complications and disorders of the circulatory system, not elsewhere classified: Secondary | ICD-10-CM | POA: Diagnosis not present

## 2020-02-05 LAB — COMPREHENSIVE METABOLIC PANEL
ALT: 19 U/L (ref 0–44)
AST: 28 U/L (ref 15–41)
Albumin: 2.6 g/dL — ABNORMAL LOW (ref 3.5–5.0)
Alkaline Phosphatase: 88 U/L (ref 38–126)
Anion gap: 8 (ref 5–15)
BUN: 7 mg/dL (ref 6–20)
CO2: 25 mmol/L (ref 22–32)
Calcium: 8.3 mg/dL — ABNORMAL LOW (ref 8.9–10.3)
Chloride: 97 mmol/L — ABNORMAL LOW (ref 98–111)
Creatinine, Ser: 0.81 mg/dL (ref 0.44–1.00)
GFR, Estimated: >60 mL/min (ref >60)
Glucose, Bld: 94 mg/dL (ref 70–99)
Potassium: 3.9 mmol/L (ref 3.5–5.1)
Sodium: 130 mmol/L — ABNORMAL LOW (ref 135–145)
Total Bilirubin: 0.6 mg/dL (ref 0.3–1.2)
Total Protein: 5.3 g/dL — ABNORMAL LOW (ref 6.5–8.1)

## 2020-02-05 LAB — CBC
HCT: 32 % — ABNORMAL LOW (ref 36.0–46.0)
Hemoglobin: 10.4 g/dL — ABNORMAL LOW (ref 12.0–15.0)
MCH: 30.4 pg (ref 26.0–34.0)
MCHC: 32.5 g/dL (ref 30.0–36.0)
MCV: 93.6 fL (ref 80.0–100.0)
Platelets: 180 10*3/uL (ref 150–400)
RBC: 3.42 MIL/uL — ABNORMAL LOW (ref 3.87–5.11)
RDW: 18.6 % — ABNORMAL HIGH (ref 11.5–15.5)
WBC: 10.7 10*3/uL — ABNORMAL HIGH (ref 4.0–10.5)
nRBC: 0 % (ref 0.0–0.2)

## 2020-02-05 LAB — RPR: RPR Ser Ql: NONREACTIVE

## 2020-02-05 MED ORDER — DOXYLAMINE SUCCINATE (SLEEP) 25 MG PO TABS
25.0000 mg | ORAL_TABLET | Freq: Every evening | ORAL | Status: DC | PRN
  Filled 2020-02-05: qty 1

## 2020-02-05 MED ORDER — FAMOTIDINE 20 MG PO TABS
20.0000 mg | ORAL_TABLET | Freq: Two times a day (BID) | ORAL | Status: DC
  Administered 2020-02-05 – 2020-02-07 (×5): 20 mg via ORAL
  Filled 2020-02-05 (×5): qty 1

## 2020-02-05 MED ORDER — ESCITALOPRAM OXALATE 10 MG PO TABS
10.0000 mg | ORAL_TABLET | Freq: Every day | ORAL | Status: DC
  Administered 2020-02-05 – 2020-02-07 (×3): 10 mg via ORAL
  Filled 2020-02-05 (×3): qty 1

## 2020-02-05 MED ORDER — ACETAMINOPHEN 325 MG PO TABS
650.0000 mg | ORAL_TABLET | Freq: Once | ORAL | Status: AC
  Administered 2020-02-05: 650 mg via ORAL
  Filled 2020-02-05: qty 2

## 2020-02-05 NOTE — Progress Notes (Signed)
Continues alert and oriented with pleasant affect. HR is 50 and continues on continues Pulse ox. With Heart rate monitoring.

## 2020-02-05 NOTE — Progress Notes (Signed)
Pt. Has scored a 16 on Edinburgh assessment and SS Referral placed as per Protocol.

## 2020-02-05 NOTE — Consult Note (Addendum)
Medicine Consult Note  Oluwadara Gorman BMW:[id number] DOB: 07/21/1985 DOA: 02/04/2020  PCP: Alba Cory, MD  Outpatient Specialists: Dr. Jean Rosenthal, [place] [facility]; Dr. Melvyn Novas, Neurology  Patient coming from: Home  I have personally briefly reviewed patient's old medical records in [facility] EMR.  Chief Concern: bradycardia   HPI: Anna Whitehead is a 34 y.o. 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  Syncopal work-up during pregnancy revealed right temporal herniation into the distal right transverse sinus noted on MRI.  Neurosurgery at [facility] was consulted and evaluated patient on 12/20/2019.  MRA/MRV ordered was negative for thrombus.  She was seen by neurologist on 02/01/2020 who based on imaging available in the patient's symptoms, recommended delivery by cesarean section, it was felt risk of vaginal delivery includes elevation of intracranial blood pressure, increasing the cerebrospinal fluid pressure and putting her at risk for worsening herniation.  She is status post day 1 of cesarean C-section for gestation of [redacted]w[redacted]d.   Social history: Patient is an Insurance account manager.  She lives with her husband and son.  She denies tobacco use, currently no alcohol use.  Denies recreational drug use.  Family history: Mother had diagnosis of Takotsubo.  Maternal grandfather had CABG surgery.  Chart review: Documented bradycardia at 35-week gestational visit, heart rate 54  Review of Systems: As per HPI otherwise 10 point review of systems negative.  Assessment/Plan  Active Problems:   Supervision of high risk pregnancy, antepartum   Hx of preeclampsia, prior pregnancy, currently pregnant    Obesity affecting pregnancy   Rh negative state in antepartum period   Labor and delivery, indication for care   Abnormal MRI of head   [redacted] weeks gestation of pregnancy   Bradycardia following surgery   Bradycardia-suspect symptomatic, concerning for sick sinus syndrome -Patient would likely benefit from cardiac event monitor on discharge -Cardiology consult for further recommendation -Discussed with primary provider -Medicine team will continue to follow  Leukocytosis-Per primary provider  Normocytic mild anemia-likely secondary to blood loss  Chart reviewed.   DVT prophylaxis: Per primary Code Status: Full code Diet: Regular Family Communication: Mother was at bedside Disposition Plan: Per primary Consults called: Cardiology  Past Medical History:  Diagnosis Date  . Allergic eczema   . Anxiety   . Depression   . Dysmenorrhea   . Family history of breast cancer    4/21 Cancer genetic testing letter sent  . Iron deficiency anemia 10/03/2019  . Iron deficiency anemia due to chronic blood loss   . Menorrhagia   . Midline low back pain with left-sided sciatica   . Mild asthma    seasonal, not used recently  . Overweight (BMI 25.0-29.9)   . Right foot pain   . Seasonal allergic rhinitis    Past Surgical History:  Procedure Laterality Date  . CESAREAN SECTION  02/04/2020   Procedure: CESAREAN SECTION;  Surgeon: Conard Novak, MD;  Location: [facility] ORS;  Service: Obstetrics;;  . NO PAST SURGERIES     Social History:  reports that she has never smoked. She has never used smokeless tobacco. She reports previous alcohol use. She reports that she does not use drugs.  No Known Allergies Family History  Problem Relation Age of Onset  . Bipolar disorder Mother   . Breast cancer Maternal Aunt  40  . Breast cancer Maternal Aunt 50  . Pancreatic cancer Maternal Aunt 70  . Ovarian cancer Maternal Great-grandmother    Family history: Family history reviewed and not  pertinent for bradycardia. Mother had Takotsubo.  Maternal grandfather had CABG.  Prior to Admission medications   Medication Sig Start Date End Date Taking? Authorizing Provider  doxylamine, Sleep, (UNISOM) 25 MG tablet Take 25 mg by mouth at bedtime as needed.    [provider]  escitalopram (LEXAPRO) 10 MG tablet Take 1 tablet (10 mg total) by mouth daily. 01/02/20   Mirna Mires, CNM  ondansetron (ZOFRAN ODT) 4 MG disintegrating tablet Take 1 tablet (4 mg total) by mouth every 6 (six) hours as needed for nausea. 09/21/19   Nadara Mustard, MD  Prenatal Multivit-Min-Fe-FA (PRENATAL, W/IRON & FA,) 27-0.8 MG TABS Take 1 tablet by mouth daily.    [provider]  Pyridoxine HCl (VITAMIN B-6) 250 MG tablet Take 250 mg by mouth daily.    [provider]   Physical Exam: Vitals:   02/05/20 0900 02/05/20 1100 02/05/20 1104 02/05/20 1300  BP:   114/74   Pulse: (!) 48 (!) 57 (!) 52 73  Resp:   18   Temp:   98.4 F (36.9 C)   TempSrc:   Oral   SpO2: 97% 96%  96%  Weight:      Height:       Constitutional: appears age-appropriate, NAD, calm, comfortable Eyes: PERRL, lids and conjunctivae normal ENMT: Mucous membranes are moist. Posterior pharynx clear of any exudate or lesions. Age-appropriate dentition. Hearing appropriate Neck: normal, supple, no masses, no thyromegaly Respiratory: clear to auscultation bilaterally, no wheezing, no crackles. Normal respiratory effort. No accessory muscle use.  Cardiovascular: Regular rate and rhythm, no murmurs / rubs / gallops. No extremity edema. 2+ pedal pulses. No carotid bruits.  Abdomen: mild tenderness, no masses palpated, no hepatosplenomegaly. Bowel sounds positive. Low transverse abdominal dressing in place consistent with recent cesarean section.  Appears clean and intact.  Negative for visual signs of infection, bleeding, discharge. Pain pump in place. Musculoskeletal: no clubbing / cyanosis. No joint deformity  upper and lower extremities. Good ROM, no contractures, no atrophy. Normal muscle tone.  Skin: no rashes, lesions, ulcers. No induration Neurologic: Sensation intact. Strength 5/5 in all 4.  Psychiatric: Normal judgment and insight. Alert and oriented x 3. Normal mood.   EKG: Independently reviewed, showing sinus bradycardia with a rate of 53. No ST-T wave abnormalities.  Chest x-ray on Admission: None ordered by primary provider.  Labs on Admission: I have personally reviewed following labs  CBC: Recent Labs  Lab 02/04/20 0907 02/05/20 0538  WBC 5.4 10.7*  HGB 12.5 10.4*  HCT 38.4 32.0*  MCV 94.1 93.6  PLT 187 180   Basic Metabolic Panel: Recent Labs  Lab 02/04/20 0907 02/05/20 1026  NA 133* 130*  K 3.5 3.9  CL 101 97*  CO2 22 25  GLUCOSE 108* 94  BUN 9 7  CREATININE 0.62 0.81  CALCIUM 8.6* 8.3*   GFR: Estimated Creatinine Clearance: 127.8 mL/min (by C-G formula based on SCr of 0.81 mg/dL). Liver Function Tests: Recent Labs  Lab 02/04/20 0907 02/05/20 1026  AST 29 28  ALT 22 19  ALKPHOS 105 88  BILITOT 0.7 0.6  PROT 6.6 5.3*  ALBUMIN 3.1* 2.6*      Component Value Date/Time   GLUCOSEU Negative 01/30/2020 1651   BILIRUBINUR negative 05/20/2017 0957   PROTEINUR trace  05/20/2017 0957   UROBILINOGEN negative (A) 05/20/2017 0957   NITRITE negative 05/20/2017 0957   LEUKOCYTESUR Large (3+) (A) 05/20/2017 0957   Vina Byrd N Cherise Fedder D.O. [facility]  If 12AM-7AM, please contact overnight-coverage provider If 7AM-7PM, please contact day coverage provider [email or web address]  02/05/2020, 2:24 PM

## 2020-02-05 NOTE — Plan of Care (Signed)
Alert and oriented with Pleasant affect. Color is pale, Skin w&d, cap. Refill < 2 seconds. Continues to have Low Heart Rate. Continues Cardiac monitoring in place. Pt. Denies diaphoresis, Dizziness and SOB. H.R. is regular and O2 sat. Is WNL. Instructed Pt. To notify RN if any Symptoms develop and she v/o. Positive Bowel sounds but denies flatus. Instructed Pt. To ambulate in [place] this evening and she v/o. Pt. Is Breast and Bottle Feeding. No colostrum noted with self expression,and Infant has bottle fed frequently today;therefore, Mom assisted with pumping and she is tolerating well. Denies H/A, Nausea, or any other c/o.

## 2020-02-05 NOTE — Progress Notes (Signed)
Pt. Is sitting up in bed, Alert and oriented with pleasant affect. Color pale, skin w&d. Denies c/o except rates pain a 5/10 on  Pain scale. Heart rate is 49 Radially over 1 minute, B/P is 134/93, RR is 15 and temp. Is 98.0 and Pulse Ox. Is 100% on R/A. Dr. Jean Rosenthal informed of above VS and Pt. Assessment as well as u/o. No new orders except to re-notify him if Pt. Becomes symptomatic with her Bradycardia. I will place Pt. On cont. Pulse Ox. So that her Heart Rate can be continuously monitored. Pt. Appears comfortable.

## 2020-02-05 NOTE — Lactation Note (Signed)
This note was copied from a baby's chart. Lactation Consultation Note  Patient Name: Anna Whitehead Date: 02/05/2020 Reason for consult: Follow-up assessment  Lactation to the room for initial visit. Mother is resting in bed, baby was asleep in bassinet.  Encouraged feeding on demand and with cues. If baby is not cueing encouraged hand expression and skin to skin. Taught proper technique for hand expression. Mother is very tender to [facility] hand expressing, Mother is able to see a small drop on each side with hand expression. Mother has been supplementing with formula bottle first child had hyperbili, undiagnosed tongue tie, LMS and difficulty with latch and feeding. LC discussed supply and demand and would encourage breast feed prior to bottle, also can try to supplement at breast with SNS if Mother would like to try.  Baby was left skin to skin and feeding in laid back hold. Attempted to latch in football and baby is arching his back and pushing off the breast.  Encouraged 8 or more attempts in the first 24 hours and 8 or more good feeds after 24 HOL. Reviewed appropriate diapers for days of life and How to know your baby is getting enough to eat. Reviewed "Understanding Postpartum and Newborn Care" booklet at bedside. [facility] # left on board, encouraged to call for any assistance. Mother was shown how to pump with Symphony at bedside, encouraged hand expression after pumping once baby is 24 HOL. Mother has an Elvie pump at home, discussed rental of a symphony for the first 4 weeks. Mother has no further questions at this time.   Interventions Interventions: DEBP   Consult Status Consult Status: Follow-up Date: 02/06/20 Follow-up type: Call as needed    Dierdre Mccalip D Janesia Joswick 02/05/2020, 3:40 PM

## 2020-02-05 NOTE — Progress Notes (Signed)
Obstetric Postpartum/PostOperative Daily Progress Note Subjective:  34 y.o. [id number] post-operative day # 1 status post primary cesarean delivery.  She is not ambulating as she has not been out of bed yet since removal of her Foley catheter, is tolerating po, is not voiding spontaneously yet. She has not voided since her Foley catheter was removed.  Her pain is well controlled on PO pain medications. Her lochia is equal to menses.   Medications SCHEDULED MEDICATIONS  . ferrous sulfate  325 mg Oral BID WC  . ibuprofen  600 mg Oral Q6H  . ketorolac  15 mg Intravenous Q6H  . prenatal multivitamin  1 tablet Oral Q1200  . senna-docusate  2 tablet Oral Q24H  . simethicone  80 mg Oral TID PC    MEDICATION INFUSIONS  . lactated ringers    . oxytocin      PRN MEDICATIONS  coconut oil, witch hazel-glycerin **AND** dibucaine, diphenhydrAMINE, doxylamine (Sleep), HYDROmorphone (DILAUDID) injection, menthol-cetylpyridinium, oxyCODONE-acetaminophen **OR** oxyCODONE-acetaminophen    Objective:   Vitals:   02/05/20 0600 02/05/20 0700 02/05/20 0807 02/05/20 0900  BP:   108/66   Pulse: (!) 49 (!) 43 (!) 50 (!) 48  Resp:   18   Temp:   97.8 F (36.6 C)   TempSrc:   Oral   SpO2: 97% 96% 99% 97%  Weight:      Height:       Pulse recorded at 0901 was 84 bpm   Current Vital Signs 24h Vital Sign Ranges  T 97.8 F (36.6 C) Temp  Avg: 97.7 F (36.5 C)  Min: 97.1 F (36.2 C)  Max: 98 F (36.7 C)  BP 108/66 BP  Min: 105/60  Max: 134/93  HR (!) 48 Pulse  Avg: 68.7  Min: 43  Max: 146  RR 18 Resp  Avg: 15.9  Min: 12  Max: 19  SaO2 97 % Room Air SpO2  Avg: 98.3 %  Min: 96 %  Max: 100 %       24 Hour I/O Current Shift I/O  Time Ins Outs 11/20 0701 - 11/21 0700 In: 2637 [P.O.:1420; I.V.:1117] Out: 1570 [Urine:650] No intake/output data recorded.  General: NAD, pulse ranging from 49 to 65 bpm. Pulmonary: CTAB CV: bradycardia, rhythm regular Abdomen: non-distended, non-tender, fundus firm at  level of umbilicus Inc: Clean/dry/intact (on Q in place, bandage in place) Extremities: no edema, no erythema, no tenderness  Labs:  Recent Labs  Lab 02/04/20 0907 02/05/20 0538  WBC 5.4 10.7*  HGB 12.5 10.4*  HCT 38.4 32.0*  PLT 187 180     Assessment:   34 y.o. G2P2002 postoperative day # 1 status post primary cesarean section, gestational hypertension, bradycardia  Plan:  1) Acute blood loss anemia - hemodynamically stable and asymptomatic - po ferrous sulfate  2) O NEG / Rubella 1.89 (04/05 1019)/ Varicella Immune  3) TDAP status 12/05/2019, Flu vaccine: 11/25/2019  4) breast feeding, she is adding some formula while she's waiting for her milk to come in /Contraception = IUD   5) gestational hypertension: continue to monitor BPs  6) bradycardia: will get EKG, CMP. hospitalist as needed. She is currently asymptomatic and transitioning from 49 to 65 bpm  7) Disposition: home POD#2-3 pending clinical stability  Conard Novak, MD, FACOG 02/05/2020 10:00 AM

## 2020-02-05 NOTE — Progress Notes (Signed)
This writer was called to do STAT ECG on patient.  Upon arrival noticed patient HR now in the 70's.  Verlon Au, RN in the room and spoke with her about patient no longer being bradycardic. Stickers placedon patient and Verlon Au instructed on how to place leads on patient and obtain ECG if patient becomes bradycardic again.

## 2020-02-06 ENCOUNTER — Encounter: Payer: Self-pay | Admitting: Obstetrics and Gynecology

## 2020-02-06 ENCOUNTER — Inpatient Hospital Stay (HOSPITAL_COMMUNITY)
Admit: 2020-02-06 | Discharge: 2020-02-06 | Disposition: A | Discharge status: Home / Self Care | Payer: BC Managed Care – PPO | Attending: Internal Medicine | Admitting: Internal Medicine

## 2020-02-06 DIAGNOSIS — R930 Abnormal findings on diagnostic imaging of skull and head, not elsewhere classified: Secondary | ICD-10-CM | POA: Diagnosis not present

## 2020-02-06 DIAGNOSIS — R55 Syncope and collapse: Secondary | ICD-10-CM | POA: Diagnosis not present

## 2020-02-06 DIAGNOSIS — I9789 Other postprocedural complications and disorders of the circulatory system, not elsewhere classified: Secondary | ICD-10-CM | POA: Diagnosis not present

## 2020-02-06 DIAGNOSIS — R001 Bradycardia, unspecified: Secondary | ICD-10-CM

## 2020-02-06 LAB — ECHOCARDIOGRAM COMPLETE
AR max vel: 2.81 cm2
AV Area VTI: 2.94 cm2
AV Area mean vel: 2.7 cm2
AV Mean grad: 5 mmHg
AV Peak grad: 10.9 mmHg
Ao pk vel: 1.65 m/s
Area-P 1/2: 4.01 cm2
Height: 70 in
S' Lateral: 2.91 cm
Weight: 3536 oz

## 2020-02-06 LAB — CBC
HCT: 33.4 % — ABNORMAL LOW (ref 36.0–46.0)
Hemoglobin: 10.5 g/dL — ABNORMAL LOW (ref 12.0–15.0)
MCH: 30.4 pg (ref 26.0–34.0)
MCHC: 31.4 g/dL (ref 30.0–36.0)
MCV: 96.8 fL (ref 80.0–100.0)
Platelets: 144 10*3/uL — ABNORMAL LOW (ref 150–400)
RBC: 3.45 MIL/uL — ABNORMAL LOW (ref 3.87–5.11)
RDW: 19.1 % — ABNORMAL HIGH (ref 11.5–15.5)
WBC: 8.1 10*3/uL (ref 4.0–10.5)
nRBC: 0 % (ref 0.0–0.2)

## 2020-02-06 LAB — TSH: TSH: 0.442 u[IU]/mL (ref 0.350–4.500)

## 2020-02-06 MED ORDER — SODIUM CHLORIDE 0.9 % IV SOLN
8.0000 mg | Freq: Three times a day (TID) | INTRAVENOUS | Status: DC | PRN
  Administered 2020-02-06: 8 mg via INTRAVENOUS
  Filled 2020-02-06: qty 4

## 2020-02-06 MED ORDER — SODIUM CHLORIDE 0.9 % IV SOLN
200.0000 mg | Freq: Once | INTRAVENOUS | Status: AC
  Administered 2020-02-06: 200 mg via INTRAVENOUS
  Filled 2020-02-06: qty 10

## 2020-02-06 NOTE — Progress Notes (Signed)
Resting with eyes closed. Quiet affect,apropriate.  Cont.'s with c/o dizziness and nausea. LL&D called and Dr. Jean Rosenthal is in a delivery at this time.

## 2020-02-06 NOTE — Progress Notes (Signed)
States relief of Nausea after IV Zofran. States dizziness is better. Resting with eyes closed. Medicated for pain as per PRN and scheduled order.

## 2020-02-06 NOTE — Lactation Note (Signed)
Lactation Consultation Note  Patient Name: Anna Whitehead EHOZY'Y Date: 02/06/2020   Lactation follow-up. Baby was discharged earlier today and dad took him home. Patient remains in hospital with support by her mother. Yesterday, LC assisted with laid-back positioning, set-up of DEBP, and education on hand expression. Patient has used DEBP 1x since set-up, and has not attempted self hand-expression since demonstration was done. She reports she has been too tired, hand expression was painful, and she was taking care of the baby. Patient's mother is bedside and pro-breastfeeding, asking questions and involved in conversation to be supportive for her daughter. LC did talk through with patient and support person about use of SNS when she is at home to help with feedings at the breast, encouraged to read through manual, manufacturer's website for references, and outpatient lactation. LC stressed importance of frequent breast stimulation through hand expression and use of DEBP for establishment of adequate milk production, milk supply and demand, and normal course of lactation. Pump rental for hospital grade pump was reviewed, patient states she is familiar with the process; information left for them to review.     Anna Whitehead 02/06/2020, 4:21 PM

## 2020-02-06 NOTE — Progress Notes (Signed)
*  PRELIMINARY RESULTS* Echocardiogram 2D Echocardiogram has been performed.  Anna Whitehead 02/06/2020, 12:31 PM

## 2020-02-06 NOTE — Progress Notes (Signed)
Called to see patient after an episode of patient feeling dizzy, nauseated, and lightheaded. She was attempting a bowel movement when this episode began.  Her heart rate is in the 40's, though it had gone up to the 80s briefly and has settled more into the 40s-50s range. She is mentating well and answers questions appropriately. Vitals otherwise normal. Will give a small dose of zofran and will order a CBC. Cardiology has previously been consulted after hospitalist assessment yesterday.  Dr. Chilton Si aware through Secure Chat yesterday afternoon.   Continue to monitor. If symptoms worsen or abrupt change in rhythm that is sustained, will call hospitalist for advice.    [name], MD, [facility], [facility] 02/06/2020 5:24 AM

## 2020-02-06 NOTE — Progress Notes (Signed)
Dr. Jean Rosenthal notified of Pt. Having dizziness, nausea and irregular heart  Beat after having bowel movement and that these s/s are continuing. Order for CBC, Zofran and Cardiology consult placed by Dr. Jean Rosenthal.

## 2020-02-06 NOTE — Progress Notes (Signed)
During assessment, pt stated she does still feel dizzy, light headed, and "just out of it". Pt encouraged to call out if she needs to get out of bed. CNM updated. Will continue to monitor.

## 2020-02-06 NOTE — Hospital Course (Signed)
Anna Whitehead is a 34 y.o. 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    [facility] is consulted by OB/GYN service for evaluation of bradycardia and pre-syncope.  Cardiology consulted.  Echocardiogram is normal with EF 60-65%, normal diastolic parameters, and no significant valvular findings.

## 2020-02-06 NOTE — TOC Initial Note (Signed)
Transition of Care [facility]) - Initial/Assessment Note    Patient Details  Name: Anna Whitehead MRN: [id number] Date of Birth: Jun 15, 1985  Transition of Care [facility]) CM/SW Contact:    Victorino Dike, RN Phone Number: 02/06/2020, 9:43 AM  Clinical Narrative:                  Met with patient in room.  She reports living at home with her husband.  She reports they have all things needed for baby.  Met her mother and husband in room.  Interactions observed where supportive of mother and baby.  Mother reports she struggles with anxiety at times and is self aware of increased anxiety with delivery of baby and changes this makes.  As well as the holidays.  She reports a good relationship with her PCP that she can talk with.    No other needs expressed at this time.     Expected Discharge Plan: Home/Self Care Barriers to Discharge: No Barriers Identified   Expected Discharge Plan and Services Expected Discharge Plan: Home/Self Care Living arrangements for the past 2 months: Single Family Home  Prior Living Arrangements/Services Living arrangements for the past 2 months: Single Family Home Lives with:: Self, Spouse Patient language and need for interpreter reviewed:: Yes Do you feel safe going back to the place where you live?: Yes      Need for Family Participation in Patient Care: No (Comment) Care giver support system in place?: Yes (comment)   Criminal Activity/Legal Involvement Pertinent to Current Situation/Hospitalization: No - Comment as needed  Activities of Daily Living Home Assistive Devices/Equipment: None ADL Screening (condition at time of admission) Patient's cognitive ability adequate to safely complete daily activities?: Yes Is the patient deaf or have difficulty hearing?: No Does the patient have difficulty seeing, even when wearing glasses/contacts?: No Does the patient have difficulty concentrating, remembering, or making decisions?: No Patient able to express  need for assistance with ADLs?: Yes Does the patient have difficulty dressing or bathing?: No Independently performs ADLs?: Yes (appropriate for developmental age) Does the patient have difficulty walking or climbing stairs?: No Weakness of Legs: None Weakness of Arms/Hands: None   Emotional Assessment Appearance:: Appears stated age Attitude/Demeanor/Rapport: Self-Confident, Engaged, Ambitious, Gracious Affect (typically observed): Accepting, Appropriate, Calm, Happy, Pleasant Orientation: : Oriented to Self, Oriented to Place, Oriented to  Time, Oriented to Situation Alcohol / Substance Use: Not Applicable Psych Involvement: No (comment)  Admission diagnosis:  Labor and delivery, indication for care [O75.9] Patient Active Problem List   Diagnosis Date Noted  . Bradycardia following surgery 02/05/2020  . Labor and delivery, indication for care 02/04/2020  . Abnormal MRI of head 02/04/2020  . [redacted] weeks gestation of pregnancy   . Anemia during pregnancy in second trimester 10/03/2019  . Obesity affecting pregnancy 08/29/2019  . Rh negative state in antepartum period 08/29/2019  . Family history of breast cancer 07/18/2019  . Family history of pancreatic cancer 07/18/2019  . Supervision of high risk pregnancy, antepartum 06/16/2019  . Hx of preeclampsia, prior pregnancy, currently pregnant 06/16/2019  . Depression, major, recurrent, moderate ([place]) 07/18/2016  . B12 deficiency 07/18/2016  . Vitamin D insufficiency 07/18/2016  . History of gestational hypertension 11/14/2015  . Asthma, mild 10/13/2014  . H/O high risk medication treatment 10/13/2014  . Overweight 10/13/2014  . Anxiety and depression 10/13/2014   PCP:  Steele Sizer, MD Pharmacy:   [place], [address] [place]  [place] [address] Phone: [phone] Fax: [phone]

## 2020-02-06 NOTE — Progress Notes (Signed)
Pt. Husband came to Nursing desk and stated Pt. Became dizzy and nauseated while she was having a B.M. Pt. Was lying supine in bed when assessed and continues with above c/o. Color pale, skin w&d. HR irregular at 44, RR unlabored, BP is 140/86, Temp. Is 98.0. O2 sat. Is 99% R/A. Dr. Jean Rosenthal paged.

## 2020-02-06 NOTE — Progress Notes (Signed)
Obstetric Postpartum/PostOperative Daily Progress Note Subjective:  34 y.o. [id number] post-operative day # 2 status post primary cesarean delivery.  She is ambulating with assistance, is tolerating po, is voiding spontaneously.  Her pain is well controlled on PO pain medications and On Q pump. Her lochia is less than menses. She is in low fowlers during visit and denies any feeling of being lightheaded or dizzy.    Medications SCHEDULED MEDICATIONS  . escitalopram  10 mg Oral Daily  . famotidine  20 mg Oral BID  . ferrous sulfate  325 mg Oral BID WC  . ibuprofen  600 mg Oral Q6H  . prenatal multivitamin  1 tablet Oral Q1200  . senna-docusate  2 tablet Oral Q24H  . simethicone  80 mg Oral TID PC    MEDICATION INFUSIONS  . lactated ringers    . ondansetron [facility]) IV 8 mg (02/06/20 0544)    PRN MEDICATIONS  coconut oil, witch hazel-glycerin **AND** dibucaine, diphenhydrAMINE, doxylamine (Sleep), HYDROmorphone (DILAUDID) injection, menthol-cetylpyridinium, ondansetron (ZOFRAN) IV, oxyCODONE-acetaminophen **OR** oxyCODONE-acetaminophen    Objective:   Vitals:   02/06/20 0522 02/06/20 0700 02/06/20 0803 02/06/20 0900  BP: (!) 142/90  115/79   Pulse: (!) 47 (!) 44 (!) 46 (!) 59  Resp: 18  16   Temp: 97.9 F (36.6 C)  97.8 F (36.6 C)   TempSrc: Oral  Oral   SpO2: 98% 95% 95% 98%  Weight:      Height:        Current Vital Signs 24h Vital Sign Ranges  T 97.8 F (36.6 C) Temp  Avg: 98.1 F (36.7 C)  Min: 97.8 F (36.6 C)  Max: 98.4 F (36.9 C)  BP 115/79 BP  Min: 110/69  Max: 142/90  HR (!) 59 Pulse  Avg: 55.2  Min: 44  Max: 73  RR 16 Resp  Avg: 18.3  Min: 16  Max: 20  SaO2 98 % Room Air SpO2  Avg: 96.7 %  Min: 93 %  Max: 100 %       24 Hour I/O Current Shift I/O  Time Ins Outs 11/21 0701 - 11/22 0700 In: -  Out: 950 [Urine:950] No intake/output data recorded.  General: NAD Pulmonary: no increased work of breathing Abdomen: non-distended, non-tender, fundus firm at level  of umbilicus Inc: Honeycomb dressing is Clean/dry/intact, On Q pump with serous drainage Extremities: no edema, no erythema, no tenderness  Labs:  Recent Labs  Lab 02/04/20 0907 02/05/20 0538 02/06/20 0544  WBC 5.4 10.7* 8.1  HGB 12.5 10.4* 10.5*  HCT 38.4 32.0* 33.4*  PLT 187 180 144*     Assessment:   34 y.o. G2P2002 postoperative day # 2 status post primary cesarean section  Plan:  1) Acute blood loss anemia - hemodynamically stable and asymptomatic - po ferrous sulfate  2) Symptomatic bradycardia: Echocardiogram today. Plan per cardiology to wear heart monitor for 2 weeks  3) O NEG / Rubella 1.89 (04/05 1019)/ Varicella Immune  4) TDAP status given antepartum  5) Formula feeding and wants to continue pumping for breastfeeding  6) Contraception = IUD   7) Follow up with ENT/Neurology for previously scheduled appointments  8) Disposition: continue current care   Tresea Mall, CNM 02/06/2020 10:36 AM

## 2020-02-06 NOTE — Progress Notes (Addendum)
PROGRESS NOTE    Anna GreeningSarah Manders   ZOX:[id number]  DOB: 1985/06/15  PCP: Alba CorySowles, Krichna, MD    DOA: 02/04/2020 LOS: 2   Brief Narrative   Anna GreeningSarah Whitehead is a 34 y.o. 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    TRH is consulted by OB/GYN service for evaluation of bradycardia and pre-syncope.  Cardiology consulted.  Echocardiogram is normal with EF 60-65%, normal diastolic parameters, and no significant valvular findings.       Assessment & Plan   Active Problems:   Supervision of high risk pregnancy, antepartum   Hx of preeclampsia, prior pregnancy, currently pregnant   Obesity affecting pregnancy   Rh negative state in antepartum period   Labor and delivery, indication for care   Abnormal MRI of head   [redacted] weeks gestation of pregnancy   Bradycardia following surgery   Presyncopal episodes secondary to symptomatic bradycardia - Cardiology following. Echo is normal.   --Monitor on telemetry --Ambulatory heart monitor at d/c per cardiology --Outpatient follow up with cardiology    Acute blood loss anemia / history of iron deficiency - Followed by Dr. Cathie HoopsYu, pt gets iron infusions, was scheduled for 3rd of 4 which she missed due to being admitted.  Will give IV Venofer and have follow up with Dr. Cathie HoopsYu.   Leukocytosis - likely reactive in post-op setting.  No s/sx's of infection.  Follow CBC.   Hyponatremia - Mild.  Na 133>>130 today.  Suspect hypovolemia post-operatively.  Recheck BMP in AM.     Hx of Right Temporal Lobe Herniation - stable without acute issues.  Follow up with [facility] neurologist as scheduled. Monitor for signs of increasing intracranial pressure  - Cushing's triad (bradycardia, abnormal respirations, widened pulse pressure), vision loss/changes, headache.     Patient BMI: Body mass index is 31.71 kg/m.   DVT prophylaxis: SCD's Start: 02/04/20 2008   Diet:  Diet Orders (From admission, onward)    Start     Ordered   02/05/20 0300  Diet regular Room service appropriate? Yes; Fluid consistency: Thin  Diet effective now       Question Answer Comment  Room service appropriate? Yes   Fluid consistency: Thin      02/05/20 0259            Code Status: Prior    Subjective 02/06/20    Pt seen with her mother at bedside today.  No dizziness or lightheadedness.  Reports episode earlier today while having BM and HR had dropped with straining.  On monitor during encounter, HR ranged from 42 to 69.  No chest pain or SOB.  She is worried about episodes happening while driving.  She'd been having pre-syncopal spells before pregnancy, but seems they got worse and more frequent with pregnancy.  She denies headache, vision changes or any neurologic complaints.    Disposition Plan & Communication   Status is: Inpatient  Remains inpatient appropriate because:Inpatient level of care appropriate due to severity of illness.  Expect medically stable for d/c home by primary service tomorrow.   Dispo: The patient is from: Home              Anticipated d/c is to: Home  Anticipated d/c date is: 1 day              Patient currently is not medically stable to d/c.     Family Communication: mother at bedside during encounter    Consults, Procedures, Significant Events   Consultants:   Cardiology  OB/GYN is primary  Procedures:   Caesarian section on 11/20  Antimicrobials:  Anti-infectives (From admission, onward)   Start     Dose/Rate Route Frequency Ordered Stop   02/04/20 1433  ceFAZolin (ANCEF) IVPB 2g/100 mL premix        2 g 200 mL/hr over 30 Minutes Intravenous 30 min pre-op 02/04/20 1433 02/04/20 1721          Objective   Vitals:   02/06/20 0803 02/06/20 0900 02/06/20 1136 02/06/20 1515  BP: 115/79  140/87 126/89  Pulse: (!) 46 (!) 59 (!) 50   Resp: 16  18 18   Temp: 97.8 F (36.6 C)  98.5 F (36.9 C) 98.2 F (36.8 C)  TempSrc: Oral  Oral Oral  SpO2: 95% 98%    Weight:      Height:       No intake or output data in the 24 hours ending 02/06/20 1758 Filed Weights   02/04/20 1025  Weight: 100.2 kg    Physical Exam:  General exam: awake, alert, no acute distress Respiratory system: CTAB, no wheezes, rales or rhonchi, normal respiratory effort. Cardiovascular system: normal S1/S2, RRR, no JVD, murmurs, rubs, gallops, no pedal edema.   Gastrointestinal system: C-section site with clean dry intact dressings, soft, +bowel sounds. Central nervous system: A&O x4. no gross focal neurologic deficits, normal speech Extremities: moves all, no edema, normal tone Skin: dry, intact, normal temperature, normal color Psychiatry: normal mood, congruent affect, judgement and insight appear normal  Labs   Data Reviewed: I have personally reviewed following labs and imaging studies  CBC: Recent Labs  Lab 02/04/20 0907 02/05/20 0538 02/06/20 0544  WBC 5.4 10.7* 8.1  HGB 12.5 10.4* 10.5*  HCT 38.4 32.0* 33.4*  MCV 94.1 93.6 96.8  PLT 187 180 144*   Basic Metabolic Panel: Recent Labs  Lab 02/04/20 0907 02/05/20 1026  NA 133* 130*  K 3.5 3.9  CL 101 97*  CO2 22 25  GLUCOSE 108* 94  BUN 9 7  CREATININE 0.62 0.81  CALCIUM 8.6* 8.3*   GFR: Estimated Creatinine Clearance: 127.8 mL/min (by C-G formula based on SCr of 0.81 mg/dL). Liver Function Tests: Recent Labs  Lab 02/04/20 0907 02/05/20 1026  AST 29 28  ALT 22 19  ALKPHOS 105 88  BILITOT 0.7 0.6  PROT 6.6 5.3*  ALBUMIN 3.1* 2.6*   No results for input(s): LIPASE, AMYLASE in the last 168 hours. No results for input(s): AMMONIA in the last 168 hours. Coagulation Profile: No results for input(s): INR, PROTIME in the  last 168 hours. Cardiac Enzymes: No results for input(s): CKTOTAL, CKMB, CKMBINDEX, TROPONINI in the last 168 hours. BNP (last 3 results) No results for input(s): PROBNP in the last 8760 hours. HbA1C: No results for input(s): HGBA1C in the last 72 hours. CBG: No results for input(s): GLUCAP in the last 168 hours. Lipid Profile: No results for input(s): CHOL, HDL, LDLCALC, TRIG, CHOLHDL, LDLDIRECT in the last 72 hours. Thyroid Function Tests: Recent Labs    02/05/20 1026  TSH 0.442   Anemia Panel: No results for input(s): VITAMINB12, FOLATE, FERRITIN, TIBC, IRON, RETICCTPCT in the last 72 hours. Sepsis Labs: No results for  input(s): PROCALCITON, LATICACIDVEN in the last 168 hours.  Recent Results (from the past 240 hour(s))  SARS CORONAVIRUS 2 (TAT 6-24 HRS) Nasopharyngeal Nasopharyngeal Swab     Status: None   Collection Time: 02/02/20 10:13 AM   Specimen: Nasopharyngeal Swab  Result Value Ref Range Status   SARS Coronavirus 2 NEGATIVE NEGATIVE Final    Comment: (NOTE) SARS-CoV-2 target nucleic acids are NOT DETECTED.  The SARS-CoV-2 RNA is generally detectable in upper and lower respiratory specimens during the acute phase of infection. Negative results do not preclude SARS-CoV-2 infection, do not rule out co-infections with other pathogens, and should not be used as the sole basis for treatment or other patient management decisions. Negative results must be combined with clinical observations, patient history, and epidemiological information. The expected result is Negative.  Fact Sheet for Patients: HairSlick.no  Fact Sheet for Healthcare Providers: quierodirigir.com  This test is not yet approved or cleared by the [place] FDA and  has been authorized for detection and/or diagnosis of SARS-CoV-2 by FDA under an Emergency Use Authorization (EUA). This EUA will remain  in effect (meaning this test can be  used) for the duration of the COVID-19 declaration under Se ction 564(b)(1) of the Act, 21 U.S.C. section 360bbb-3(b)(1), unless the authorization is terminated or revoked sooner.  Performed at [facility], [address] [place], [place] [address]       Imaging Studies   ECHOCARDIOGRAM COMPLETE  Result Date: 02/06/2020    ECHOCARDIOGRAM REPORT   Patient Name:   [facility] Date of Exam: 02/06/2020 Medical Rec #:  [id number]      Height:       70.0 in Accession #:    [id number]     Weight:       221.0 lb Date of Birth:  Jun 16, 1985      BSA:          2.178 m Patient Age:    34 years       BP:           115/79 mmHg Patient Gender: F              HR:           48 bpm. Exam Location:  [facility] Procedure: 2D Echo, Color Doppler and Cardiac Doppler Indications:     R55 Syncope  History:         Patient has no prior history of Echocardiogram examinations.                  Arrythmias:Bradycardia.  Sonographer:     Humphrey Rolls RDCS (AE) Referring Phys:  [id number] Tresa Endo A Claudio Mondry Diagnosing Phys: Lorine Bears MD  Sonographer Comments: Suboptimal apical window and no subcostal window. IMPRESSIONS  1. Left ventricular ejection fraction, by estimation, is 60 to 65%. The left ventricle has normal function. The left ventricle has no regional wall motion abnormalities. Left ventricular diastolic parameters were normal.  2. Right ventricular systolic function is normal. The right ventricular size is normal. Tricuspid regurgitation signal is inadequate for assessing PA pressure.  3. The mitral valve is normal in structure. Trivial mitral valve regurgitation. No evidence of mitral stenosis.  4. The aortic valve is normal in structure. Aortic valve regurgitation is not visualized. No aortic stenosis is present.  5. The inferior vena cava is normal in size with greater than 50% respiratory variability, suggesting right atrial pressure of 3 mmHg. Conclusion(s)/Recommendation(s): Normal biventricular function without  evidence of hemodynamically  significant valvular heart disease. FINDINGS  Left Ventricle: Left ventricular ejection fraction, by estimation, is 60 to 65%. The left ventricle has normal function. The left ventricle has no regional wall motion abnormalities. The left ventricular internal cavity size was normal in size. There is  no left ventricular hypertrophy. Left ventricular diastolic parameters were normal. Right Ventricle: The right ventricular size is normal. No increase in right ventricular wall thickness. Right ventricular systolic function is normal. Tricuspid regurgitation signal is inadequate for assessing PA pressure. Left Atrium: Left atrial size was normal in size. Right Atrium: Right atrial size was normal in size. Pericardium: There is no evidence of pericardial effusion. Mitral Valve: The mitral valve is normal in structure. Trivial mitral valve regurgitation. No evidence of mitral valve stenosis. MV peak gradient, 7.3 mmHg. The mean mitral valve gradient is 3.0 mmHg. Tricuspid Valve: The tricuspid valve is normal in structure. Tricuspid valve regurgitation is trivial. No evidence of tricuspid stenosis. Aortic Valve: The aortic valve is normal in structure. Aortic valve regurgitation is not visualized. No aortic stenosis is present. Aortic valve mean gradient measures 5.0 mmHg. Aortic valve peak gradient measures 10.9 mmHg. Aortic valve area, by VTI measures 2.94 cm. Pulmonic Valve: The pulmonic valve was normal in structure. Pulmonic valve regurgitation is not visualized. No evidence of pulmonic stenosis. Aorta: The aortic root is normal in size and structure. Venous: The inferior vena cava is normal in size with greater than 50% respiratory variability, suggesting right atrial pressure of 3 mmHg. IAS/Shunts: No atrial level shunt detected by color flow Doppler.  LEFT VENTRICLE PLAX 2D LVIDd:         4.83 cm  Diastology LVIDs:         2.91 cm  LV e' medial:    12.00 cm/s LV PW:         1.07 cm  LV  E/e' medial:  9.8 LV IVS:        0.90 cm  LV e' lateral:   17.50 cm/s LVOT diam:     2.10 cm  LV E/e' lateral: 6.7 LV SV:         98 LV SV Index:   45 LVOT Area:     3.46 cm  RIGHT VENTRICLE RV Basal diam:  3.11 cm LEFT ATRIUM             Index       RIGHT ATRIUM           Index LA diam:        3.40 cm 1.56 cm/m  RA Area:     13.10 cm LA Vol (A2C):   37.6 ml 17.27 ml/m RA Volume:   30.40 ml  13.96 ml/m LA Vol (A4C):   51.6 ml 23.69 ml/m LA Biplane Vol: 47.3 ml 21.72 ml/m  AORTIC VALVE                    PULMONIC VALVE AV Area (Vmax):    2.81 cm     PV Vmax:       1.15 m/s AV Area (Vmean):   2.70 cm     PV Vmean:      83.200 cm/s AV Area (VTI):     2.94 cm     PV VTI:        0.260 m AV Vmax:           165.00 cm/s  PV Peak grad:  5.3 mmHg AV Vmean:  106.000 cm/s PV Mean grad:  3.0 mmHg AV VTI:            0.335 m AV Peak Grad:      10.9 mmHg AV Mean Grad:      5.0 mmHg LVOT Vmax:         134.00 cm/s LVOT Vmean:        82.600 cm/s LVOT VTI:          0.284 m LVOT/AV VTI ratio: 0.85  AORTA Ao Root diam: 3.40 cm MITRAL VALVE MV Area (PHT): 4.01 cm     SHUNTS MV Peak grad:  7.3 mmHg     Systemic VTI:  0.28 m MV Mean grad:  3.0 mmHg     Systemic Diam: 2.10 cm MV Vmax:       1.35 m/s MV Vmean:      72.8 cm/s MV Decel Time: 189 msec MV E velocity: 118.00 cm/s MV A velocity: 71.10 cm/s MV E/A ratio:  1.66 Lorine Bears MD Electronically signed by Lorine Bears MD Signature Date/Time: 02/06/2020/1:30:04 PM    Final      Medications   Scheduled Meds:  escitalopram  10 mg Oral Daily   famotidine  20 mg Oral BID   ferrous sulfate  325 mg Oral BID WC   ibuprofen  600 mg Oral Q6H   prenatal multivitamin  1 tablet Oral Q1200   senna-docusate  2 tablet Oral Q24H   simethicone  80 mg Oral TID PC   Continuous Infusions:  lactated ringers     ondansetron (ZOFRAN) IV 8 mg (02/06/20 0544)       LOS: 2 days    Time spent: 30 minutes with > 50% spent in coordination of care and at bedside  with patient     Pennie Banter, DO [facility]  02/06/2020, 5:58 PM    If 7PM-7AM, please contact night-coverage. How to contact the [facility] Attending or Consulting provider 7A - 7P or covering provider during after hours 7P -7A, for this patient?    1. Check the care team in [facility] and look for a) attending/consulting [facility] provider listed and b) the [facility] team listed 2. Log into [email or web address] and use [facility] universal password to access. If you do not have the password, please contact the hospital operator. 3. Locate the [facility] provider you are looking for under [facility] and page to a number that you can be directly reached. 4. If you still have difficulty reaching the provider, please page the [facility] (Director on Call) for the Hospitalists listed on amion for assistance.

## 2020-02-06 NOTE — Consult Note (Addendum)
Cardiology Consult    Patient ID: Anna Whitehead MRN: [id number], DOB/AGE: Mar 09, 1986   Admit date: 02/04/2020 Date of Consult: 02/06/2020  Primary Physician: Alba Cory, MD Primary Cardiologist: Lorine Bears, MD - new Requesting Provider: Edison Pace, MD  Patient Profile    Carolynne Schuchard is a 34 y.o. female with a history of anxiety, depression, iron deficiency anemia, childhood asthma, pulsatile tinnitus, and recurrent presyncope, who is being seen today for the evaluation of presyncope and bradycardia at the request of Dr. Jean Rosenthal.  Past Medical History   Past Medical History:  Diagnosis Date  . Allergic eczema   . Anxiety   . Depression   . Dysmenorrhea   . Family history of breast cancer    4/21 Cancer genetic testing letter sent  . Iron deficiency anemia 10/03/2019  . Iron deficiency anemia due to chronic blood loss   . Menorrhagia   . Midline low back pain with left-sided sciatica   . Mild asthma    seasonal, not used recently  . Overweight (BMI 25.0-29.9)   . Right foot pain   . Seasonal allergic rhinitis     Past Surgical History:  Procedure Laterality Date  . CESAREAN SECTION  02/04/2020   Procedure: CESAREAN SECTION;  Surgeon: Conard Novak, MD;  Location: [facility] ORS;  Service: Obstetrics;;     Allergies  No Known Allergies  History of Present Illness    34 year old female with the above past medical history including anxiety, depression, iron deficiency anemia, childhood asthma, pulsatile tinnitus, and recurrent presyncope.  She lives locally with her husband and works as an Tourist information centre manager.  She exercises about 2 days a week.  She has no prior history of chest pain or dyspnea.  Patient notes that throughout her adulthood, she would occasionally have episodes of presyncope which are generally associated with diaphoresis, nausea, and frequently vomiting.  These would infrequently while sitting or standing but in the setting of pregnancy,  she has had at least 5 episodes in the past 9 months.  Episodes seem to be more likely to occur while driving, causing her to drop to pull over, where she then vomits with resolution of symptoms in approximately 3 to 5 minutes.  She has never completely lost consciousness.  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  She was evaluated by neurosurgery at [facility] in early October and an MRA/MRV was performed and was negative for thrombosis.  She subsequently followed up with neurology on November 17 who recommended delivery by C-section, as it was felt that the mechanics of vaginal delivery could potentially worsen herniation.  On November 20 (39 weeks), she underwent C-section.  Postoperatively, she was noted to bradycardic with rates frequently in the 40s and 50s.  She was asymptomatic and was noted to have rise in heart rates with ambulation.  Early this morning, patient attempted to have her first postoperative bowel movement.  She noted that she has been constipated since surgery.  While on the commode, and in the setting of bearing down, she became diaphoretic, nauseated, and lightheaded.  She made it back to the bed by herself.  Nursing staff was alerted by her husband, and she was noted to be pale.  Heart rate was 44 with a blood pressure of 140/86 while lying in bed.  Twelve-lead ECG shows sinus bradycardia, 53, with normal intervals, and  no acute ST or T changes.  Symptoms improved after Zofran.  She is remained hemodynamically stable though heart rates at rest are currently trending in the 40s.  She is currently asymptomatic.  Inpatient Medications    . escitalopram  10 mg Oral Daily  . famotidine  20 mg Oral BID  . ferrous sulfate  325 mg Oral BID WC  . ibuprofen  600 mg Oral Q6H  . prenatal multivitamin  1 tablet Oral Q1200  . senna-docusate  2 tablet Oral  Q24H  . simethicone  80 mg Oral TID PC    Family History    Family History  Problem Relation Age of Onset  . Bipolar disorder Mother   . Breast cancer Maternal Aunt 40  . Breast cancer Maternal Aunt 50  . Pancreatic cancer Maternal Aunt 70  . Ovarian cancer Maternal Great-grandmother    She indicated that her mother is alive. She indicated that her father is alive. She indicated that her brother is alive. She indicated that her son is alive. She indicated that only one of her three maternal aunts is alive. She indicated that her maternal great-grandmother is deceased.   Social History    Social History   Socioeconomic History  . Marital status: Married    Spouse name: Para March  . Number of children: 1  . Years of education: Not on file  . Highest education level: Bachelor's degree (e.g., BA, AB, BS)  Occupational History  . Occupation: Runner, broadcasting/film/video  Tobacco Use  . Smoking status: Never Smoker  . Smokeless tobacco: Never Used  Vaping Use  . Vaping Use: Never used  Substance and Sexual Activity  . Alcohol use: Not Currently    Alcohol/week: 0.0 standard drinks  . Drug use: No  . Sexual activity: Yes    Partners: Male    Birth control/protection: None  Other Topics Concern  . Not on file  Social History Narrative   Married, first son was born 11/13/2016      Patient is getting her Master's this summer.      Lives in [place] w/ husband.  Exercises 1-2 days/wk.   Social Determinants of Health   Financial Resource Strain:   . Difficulty of Paying Living Expenses: Not on file  Food Insecurity:   . Worried About Programme researcher, broadcasting/film/video in the Last Year: Not on file  . Ran Out of Food in the Last Year: Not on file  Transportation Needs:   . Lack of Transportation (Medical): Not on file  . Lack of Transportation (Non-Medical): Not on file  Physical Activity:   . Days of Exercise per Week: Not on file  . Minutes of Exercise per Session: Not on file  Stress:   . Feeling of  Stress : Not on file  Social Connections:   . Frequency of Communication with Friends and Family: Not on file  . Frequency of Social Gatherings with Friends and Family: Not on file  . Attends Religious Services: Not on file  . Active Member of Clubs or Organizations: Not on file  . Attends Banker Meetings: Not on file  . Marital Status: Not on file  Intimate Partner Violence:   . Fear of Current or Ex-Partner: Not on file  . Emotionally Abused: Not on file  . Physically Abused: Not on file  . Sexually Abused: Not on file     Review of Systems    General:  No chills, fever, night sweats or weight changes.  Cardiovascular:  +++ presyncope assoc w/ diaphoresis/nausea.  No chest pain, dyspnea on exertion, edema, orthopnea, palpitations, paroxysmal nocturnal dyspnea. Dermatological: No rash, lesions/masses Respiratory: No cough, dyspnea Urologic: No hematuria, dysuria Abdominal:   +++ constipation.  +++ nausea in setting of presyncope this AM.  No vomiting, diarrhea, bright red blood per rectum, melena, or hematemesis Neurologic:  No visual changes, +++ wkns in setting of presyncope this AM, no changes in mental status. All other systems reviewed and are otherwise negative except as noted above.  Physical Exam    Blood pressure 115/79, pulse (!) 59, temperature 97.8 F (36.6 C), temperature source Oral, resp. rate 16, height 5\' 10"  (1.778 m), weight 100.2 kg, last menstrual period 05/07/2019, SpO2 98 %, unknown if currently breastfeeding.  General: Pleasant, NAD Psych: Normal affect. Neuro: Alert and oriented X 3. Moves all extremities spontaneously. HEENT: Normal  Neck: Supple without bruits or JVD. Lungs:  Resp regular and unlabored, CTA. Heart: RRR, bradycardic, no s3, s4, or murmurs. Abdomen: Soft, mildly tender. Lower abd transverse dsg d/i.  Non-distended, BS + x 4.  Extremities: No clubbing, cyanosis or edema. DP/PT/Radials 2+ and equal bilaterally.  Labs         Lab Results  Component Value Date   WBC 8.1 02/06/2020   HGB 10.5 (L) 02/06/2020   HCT 33.4 (L) 02/06/2020   MCV 96.8 02/06/2020   PLT 144 (L) 02/06/2020    Recent Labs  Lab 02/05/20 1026  NA 130*  K 3.9  CL 97*  CO2 25  BUN 7  CREATININE 0.81  CALCIUM 8.3*  PROT 5.3*  BILITOT 0.6  ALKPHOS 88  ALT 19  AST 28  GLUCOSE 94    Radiology Studies    US OB Follow Up  Result Date: 01/18/2020 Patient Name: Annell GreeningSarah Tellado DOB: 05/27/1985 MRN: [id number] ULTRASOUND REPORT Location: [facility] Date of Service: 01/16/2020 Indications:growth/afi Findings: Mason JimSingleton intrauterine pregnancy is visualized with FHR at 131 BPM. Biometrics give an (U/S) Gestational age of 5735w0d and an (U/S) EDD of 02/13/2020; this correlates with the clinically established Estimated Date of Delivery: 02/11/20. Fetal presentation is Cephalic. Placenta: posterior. Grade: 2 AFI: 8.2 cm Growth percentile is 52.7%.  AC percentile is 79.6%. EFW: 2942 g  ( 6 lb 8 oz ) Impression: 1. 4944w2d Viable Singleton Intrauterine pregnancy previously established criteria. 2. Growth is 52.7 %ile.  AFI is 8.2 cm. Recommendations: 1.Clinical correlation with the patient's History and Physical Exam. Deanna ArtisElyse S Fairbanks, RT Review of ULTRASOUND.    I have personally reviewed images and report of [facility].    Plan of management to be discussed with patient. Annamarie MajorPaul Harris, MD, FACOG [facility], [facility] 01/18/2020  10:22 AM   ECG & Cardiac Imaging    Sinus bradycardia, 53, no acute ST/T changes - personally reviewed.  Assessment & Plan    1.  Presyncope: Patient with a history of rare presyncopal episodes occurring while sitting or standing throughout adulthood but in the past 9 months, in the setting of her second pregnancy, she has had 5 episodes of near syncope, usually occurring while sitting/driving, associated with sudden onset of lightheadedness and diaphoresis followed by development  of nausea and subsequently vomiting.  She usually has to pull over to the side of the road where she will vomit, put her head between her legs, and turn on the air conditioner, prior to symptom resolution in approximately 5 minutes.  She is unaware of vital signs during these  episodes.  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  She was then evaluated by neurosurgery at [facility] in early October and an MRA/MRV was performed and was negative for thrombosis.  She subsequently followed up with neurology on November 17 who recommended delivery by C-section, as it was felt that the mechanics of vaginal delivery could potentially worsen herniation.  Postoperatively, she was noted to be bradycardic though rates improved with ambulation.  This morning, in the setting of a bowel movement, her first since her C-section, where she admits to having to strain secondary to constipation, she developed lightheadedness associated diaphoresis and nausea.  She was able to get back to bed and lie down.  By the time nursing staff was alerted, her heart rate was 44 and blood pressure was normal at 140/86.  ECG shows sinus bradycardia at a rate of 53 with normal intervals, and no acute ST or T changes.  Clinical picture appears to be most consistent with high vagal tone and vasovagal presyncope.  Heart rates currently in the 40s though she is asymptomatic and hemodynamically stable.  Exam is otherwise normal.  Lab work notable for normocytic anemia and hyponatremia (130).  2D echocardiogram is pending.  Provided that echo is normal and no evidence of high-grade heart block or other arrhythmia noted on inpatient monitoring, will arrange for outpatient event monitoring x2 weeks.  Encouraged adequate hydration, use of stool softeners/avoidance of bearing down, and consideration for compression socks.  2.  Sinus  bradycardia: See #1.  Currently asymptomatic with rates in the 40s and normal blood pressures.  Suspect secondary to high vagal tone.  As above, await echo.  Ambulate to ensure appropriate chronotropic response.  Plan on 2-week ZIO monitor, which we can place prior to discharge.  Follow-up TSH.  Consider atropine to bedside for recurrent vagal episodes.  3.  Right temporal herniation:  Eval by NSU/neuro @ [facility].  Unclear if this is contributing in any way to above.  Signed, Nicolasa Ducking, NP 02/06/2020, 9:40 AM  For questions or updates, please contact   Please consult [email or web address] for contact info under Cardiology/STEMI.

## 2020-02-07 ENCOUNTER — Telehealth: Payer: Self-pay

## 2020-02-07 ENCOUNTER — Ambulatory Visit (INDEPENDENT_AMBULATORY_CARE_PROVIDER_SITE_OTHER): Payer: BC Managed Care – PPO

## 2020-02-07 DIAGNOSIS — I9789 Other postprocedural complications and disorders of the circulatory system, not elsewhere classified: Secondary | ICD-10-CM | POA: Diagnosis not present

## 2020-02-07 DIAGNOSIS — R001 Bradycardia, unspecified: Secondary | ICD-10-CM | POA: Diagnosis not present

## 2020-02-07 LAB — CBC
HCT: 33.1 % — ABNORMAL LOW (ref 36.0–46.0)
Hemoglobin: 10.6 g/dL — ABNORMAL LOW (ref 12.0–15.0)
MCH: 30.7 pg (ref 26.0–34.0)
MCHC: 32 g/dL (ref 30.0–36.0)
MCV: 95.9 fL (ref 80.0–100.0)
Platelets: 155 10*3/uL (ref 150–400)
RBC: 3.45 MIL/uL — ABNORMAL LOW (ref 3.87–5.11)
RDW: 19.2 % — ABNORMAL HIGH (ref 11.5–15.5)
WBC: 8.7 10*3/uL (ref 4.0–10.5)
nRBC: 0 % (ref 0.0–0.2)

## 2020-02-07 LAB — BASIC METABOLIC PANEL
Anion gap: 6 (ref 5–15)
BUN: 6 mg/dL (ref 6–20)
CO2: 27 mmol/L (ref 22–32)
Calcium: 8.1 mg/dL — ABNORMAL LOW (ref 8.9–10.3)
Chloride: 103 mmol/L (ref 98–111)
Creatinine, Ser: 0.8 mg/dL (ref 0.44–1.00)
GFR, Estimated: >60 mL/min (ref >60)
Glucose, Bld: 79 mg/dL (ref 70–99)
Potassium: 4.3 mmol/L (ref 3.5–5.1)
Sodium: 136 mmol/L (ref 135–145)

## 2020-02-07 MED ORDER — ESCITALOPRAM OXALATE 20 MG PO TABS
20.0000 mg | ORAL_TABLET | Freq: Every day | ORAL | 5 refills | Status: DC
Start: 2020-02-07 — End: 2020-05-03

## 2020-02-07 MED ORDER — OXYCODONE-ACETAMINOPHEN 5-325 MG PO TABS: 1.0000 | ORAL_TABLET | ORAL | 0 refills | Status: DC | PRN

## 2020-02-07 MED ORDER — ESCITALOPRAM OXALATE 10 MG PO TABS
20.0000 mg | ORAL_TABLET | Freq: Every day | ORAL | Status: DC
  Administered 2020-02-07: 10 mg via ORAL
  Filled 2020-02-07: qty 2

## 2020-02-07 NOTE — Progress Notes (Signed)
Patient discharged home with husband. Discharge instructions and prescriptions given and reviewed with patient. Follow up appointments reviewed. Patient verbalized understanding. Will be escorted out by auxillary.

## 2020-02-07 NOTE — Progress Notes (Signed)
    Zio patch to be placed prior to lunchtime today by our office staff.

## 2020-02-07 NOTE — Telephone Encounter (Signed)
Zio AT monitor placed on patient at 1020. Patient instructed to wear until 02/21/2020. Patients husband listed as emergency contact.  Patient verbalized understanding of instructions given.

## 2020-02-07 NOTE — Discharge Instructions (Signed)

## 2020-02-07 NOTE — Progress Notes (Signed)
Admit Date: 02/04/2020 Today's Date: 02/07/2020  Subjective: Postpartum Day 3: Cesarean Delivery Patient reports tolerating PO, + flatus and no problems voiding.    Objective: Vital signs in last 24 hours: Temp:  [97.9 F (36.6 C)-98.5 F (36.9 C)] 97.9 F (36.6 C) (11/23 0800) Pulse Rate:  [41-75] 75 (11/23 1000) Resp:  [11-22] 11 (11/23 1000) BP: (111-140)/(63-89) 113/81 (11/23 0800) SpO2:  [95 %-100 %] 96 % (11/23 1000)  Physical Exam:  General: alert, cooperative and no distress Lochia: appropriate Uterine Fundus: firm Incision: healing well DVT Evaluation: No evidence of DVT seen on physical exam.  Recent Labs    02/06/20 0544 02/07/20 0526  HGB 10.5* 10.6*  HCT 33.4* 33.1*    Assessment/Plan: Status post Cesarean section. Doing well postoperatively.  Discharge home with standard precautions and return to clinic in 1 weeks.  Letitia Libra 02/07/2020, 11:08 AM

## 2020-02-07 NOTE — Progress Notes (Signed)
   Zio patch has been applied to the patient. She can follow up with our office in ~ 4 weeks to review these results. I will arrange her follow up.

## 2020-02-07 NOTE — Lactation Note (Signed)
Lactation Consultation Note  Patient Name: Anna Whitehead MOLMB'E Date: 02/07/2020    Patient rented DEBP from [facility] department.  Filled out all appropriate paperwork. LC reviewed set-up, use, cleaning, and milk storage, warming and thawing guidelines. Info sheet from [facility] printed off. Patient verbalizes understanding.  Anna Whitehead 02/07/2020, 12:48 PM

## 2020-02-07 NOTE — Progress Notes (Signed)
Follow up appointments made for OB, Cardiology, and Neurology.

## 2020-02-13 NOTE — Telephone Encounter (Signed)
Patient is scheduled at [facility] for 03/05/20 to arrive at 9:30 Am. Patient is aware of time and day.

## 2020-02-13 NOTE — Telephone Encounter (Signed)
x2 left vmail for patient to call back.

## 2020-02-15 ENCOUNTER — Encounter: Payer: Self-pay | Admitting: Obstetrics and Gynecology

## 2020-02-15 ENCOUNTER — Ambulatory Visit (INDEPENDENT_AMBULATORY_CARE_PROVIDER_SITE_OTHER): Payer: BC Managed Care – PPO | Admitting: Obstetrics and Gynecology

## 2020-02-15 ENCOUNTER — Other Ambulatory Visit: Payer: Self-pay

## 2020-02-15 DIAGNOSIS — O99345 Other mental disorders complicating the puerperium: Secondary | ICD-10-CM

## 2020-02-15 DIAGNOSIS — F53 Postpartum depression: Secondary | ICD-10-CM

## 2020-02-15 NOTE — Patient Instructions (Addendum)
Therapists/Counselors/Psychologists   Karen Canada, LPC, Michelle Van Horton, Cindy and Susan Gary Bailey, CSW [phone]       [phone] [address]      [address] [place], [place] [address]      [place], [place] [address]   Julia Tabor, LPC       [place], [place] [phone]       [phone] [address]     [address] [place], [place] [address]      [place], [place] [address]   Joanna Warren, LMFT      Tina Thompson [phone]       [phone] [address]      [address] [place], [place] [address]      [place], [place] [address]   Amanda Miller       Bart McCormick [phone]       [phone] [address]      [address] [place], [place] [address]      [place], [place] [address]   Cristin Saffo, PsyD      Cheryl Lawson, LPC [phone]       [phone] [address]      [address]  [place], [place] [address]      [place], [place] [address]   Courtney Jones       Laura Ellington [phone]       [facility] [address]     [phone] [place], [place] [address]      [email or web address]     Sation Konchella      Carmen Bork [phone]       [facility] [address]      [phone] [place],  [address]      [email or web address]      

## 2020-02-15 NOTE — Progress Notes (Signed)
   Postoperative Follow-up Patient presents post op from cesarean section  11 days ago.  Subjective: She denies fever, chills, nausea and vomiting. Eating a regular diet without difficulty. She has some intermittent pain for which she is taking ibuprofen and occasionally percocet.  Activity: increasing slowly. She denies issues with her incision.  She is wearing a cardiac monitor and has a follow up with the cardiologist and neurologist/neurosurgery. She is still struggling with postpartum depression. She had one anxiety attack after returning home and has had no issues since then from that standpoint.  She had an increase in her dose of lexapro at discharge from the hospital. She has had a few episodes of dizzyness, none recently.    EPDS today: 20 (was 17 in hospital prior to discharge), denies self-harm concerns.   Objective: BP 128/78   Ht 5\' 8"  (1.727 m)   Wt 197 lb (89.4 kg)   Breastfeeding No   BMI 29.95 kg/m   Constitutional: Well nourished, well developed female in no acute distress.  HEENT: normal Skin: Warm and dry.  Abdomen: s/nt/nd/uterine fundus at U-3 clean, dry, intact and without erythema, induration, warmth, and tenderness Extremity: no edema   Assessment: 34 y.o. s/p cesarean section progressing well, postpartum depression.  Plan: Patient has done well after surgery with no apparent complications.  I have discussed the post-operative course to date, and the expected progress moving forward.  The patient understands what complications to be concerned about.    Activity plan: increase slowly. Continue current dose of lexapro, as she is managing well overall. We certainly do not want her to get worse. She will follow up in 2 weeks to re-assess overall mood. She is to call earlier, if needed. She was also provided with a list of counselors in the region for therapy.    Return in about 2 weeks (around 02/29/2020) for Follow up mood and medication.  03/02/2020,  MD 02/15/2020 12:37 PM

## 2020-02-27 ENCOUNTER — Ambulatory Visit: Payer: BC Managed Care – PPO | Admitting: Neurology

## 2020-02-27 ENCOUNTER — Encounter: Payer: Self-pay | Admitting: Obstetrics and Gynecology

## 2020-02-27 ENCOUNTER — Ambulatory Visit (INDEPENDENT_AMBULATORY_CARE_PROVIDER_SITE_OTHER): Payer: BC Managed Care – PPO | Admitting: Obstetrics and Gynecology

## 2020-02-27 ENCOUNTER — Telehealth: Payer: Self-pay | Admitting: Obstetrics and Gynecology

## 2020-02-27 ENCOUNTER — Other Ambulatory Visit: Payer: Self-pay

## 2020-02-27 DIAGNOSIS — R930 Abnormal findings on diagnostic imaging of skull and head, not elsewhere classified: Secondary | ICD-10-CM

## 2020-02-27 DIAGNOSIS — F53 Postpartum depression: Secondary | ICD-10-CM | POA: Diagnosis not present

## 2020-02-27 DIAGNOSIS — O09299 Supervision of pregnancy with other poor reproductive or obstetric history, unspecified trimester: Secondary | ICD-10-CM

## 2020-02-27 DIAGNOSIS — R55 Syncope and collapse: Secondary | ICD-10-CM

## 2020-02-27 DIAGNOSIS — O0990 Supervision of high risk pregnancy, unspecified, unspecified trimester: Secondary | ICD-10-CM

## 2020-02-27 DIAGNOSIS — Z3A39 39 weeks gestation of pregnancy: Secondary | ICD-10-CM

## 2020-02-27 MED ORDER — BUPROPION HCL ER (XL) 150 MG PO TB24: 150.0000 mg | ORAL_TABLET | Freq: Every day | ORAL | 0 refills | Status: DC

## 2020-02-27 NOTE — Telephone Encounter (Signed)
Patient is schedule for 03/20/20 with SDJ for postpartum visit and mirena placement

## 2020-02-27 NOTE — Progress Notes (Signed)
Virtual Visit via Telephone Note  I connected with Anna Whitehead on 02/27/20 at  1:50 PM EST by telephone and verified that I am speaking with the correct person using two identifiers.   I discussed the limitations, risks, security and privacy concerns of performing an evaluation and management service by telephone and the availability of in person appointments. I also discussed with the patient that there may be a patient responsible charge related to this service. The patient expressed understanding and agreed to proceed.  The patient was at home I spoke with the patient from my  office The names of people involved in this encounter were: Lianne Bushy and [name], MD.   History of Present Illness: 34 y.o. [phone] female who is 3 weeks post-op from a primary c-section under general anesthesia. She is following up today for mood symptoms.  She has been taking lexapro. At her last appointment she scored 20 on the EPDS. Today her EPDS was 17.   She states that she has been about the same. She has had good days and bad days.  She has had no further anxiety attacks. She has had moments where she would need to do some mindfulness and breathing exercises.  She has been having some GI issues. She has a loss of appetite and is not having regular bowel movements. When she does eat, it hurts pretty quickly in her upper abdomen, centrally in her abdomen, like a stomach cramp. She has had either painful constipation or diarrhea.  She will go several days in between. She was taking a stool softener but this made things worse with intense stomach cramps.   Observations/Objective: Physical Exam could not be performed. Because of the COVID-19 outbreak this visit was performed over the phone and not in person.   Assessment and Plan:   ICD-10-CM   1. Postpartum depression  O99.345 buPROPion (WELLBUTRIN XL) 150 MG 24 hr tablet   F53.0      Follow Up Instructions: Follow up in 3 weeks.  If worsening GI sx, will switch medications.    I discussed the assessment and treatment plan with the patient. The patient was provided an opportunity to ask questions and all were answered. The patient agreed with the plan and demonstrated an understanding of the instructions.   The patient was advised to call back or seek an in-person evaluation if the symptoms worsen or if the condition fails to improve as anticipated.  I provided 25 minutes of non-face-to-face time during this encounter.  [name], MD  [facility] 02/27/2020 2:36 PM

## 2020-03-01 ENCOUNTER — Ambulatory Visit (INDEPENDENT_AMBULATORY_CARE_PROVIDER_SITE_OTHER): Payer: BC Managed Care – PPO | Admitting: Gastroenterology

## 2020-03-01 ENCOUNTER — Other Ambulatory Visit: Payer: Self-pay

## 2020-03-01 ENCOUNTER — Encounter: Payer: Self-pay | Admitting: Nurse Practitioner

## 2020-03-01 ENCOUNTER — Encounter: Payer: Self-pay | Admitting: Gastroenterology

## 2020-03-01 ENCOUNTER — Other Ambulatory Visit: Payer: Self-pay | Admitting: Internal Medicine

## 2020-03-01 ENCOUNTER — Ambulatory Visit (INDEPENDENT_AMBULATORY_CARE_PROVIDER_SITE_OTHER): Payer: BC Managed Care – PPO | Admitting: Nurse Practitioner

## 2020-03-01 ENCOUNTER — Encounter: Payer: Self-pay | Admitting: Cardiovascular Disease

## 2020-03-01 VITALS — BP 101/65 | HR 76 | Ht 70.0 in | Wt 202.8 lb

## 2020-03-01 VITALS — BP 120/70 | HR 52 | Ht 70.0 in | Wt 202.0 lb

## 2020-03-01 DIAGNOSIS — R55 Syncope and collapse: Secondary | ICD-10-CM

## 2020-03-01 DIAGNOSIS — I9789 Other postprocedural complications and disorders of the circulatory system, not elsewhere classified: Secondary | ICD-10-CM

## 2020-03-01 DIAGNOSIS — R001 Bradycardia, unspecified: Secondary | ICD-10-CM | POA: Diagnosis not present

## 2020-03-01 DIAGNOSIS — T17208A Unspecified foreign body in pharynx causing other injury, initial encounter: Secondary | ICD-10-CM

## 2020-03-01 DIAGNOSIS — R12 Heartburn: Secondary | ICD-10-CM

## 2020-03-01 MED ORDER — OMEPRAZOLE 40 MG PO CPDR: 40.0000 mg | DELAYED_RELEASE_CAPSULE | Freq: Every day | ORAL | 1 refills | Status: DC

## 2020-03-01 NOTE — Patient Instructions (Signed)
Medication Instructions:  Your physician recommends that you continue on your current medications as directed. Please refer to the Current Medication list given to you today.  *If you need a refill on your cardiac medications before your next appointment, please call your pharmacy*   Lab Work: None ordered    Testing/Procedures: None ordered   Follow-Up: At [facility], you and your health needs are our priority.  As part of our continuing mission to provide you with exceptional heart care, we have created designated Provider Care Teams.  These Care Teams include your primary Cardiologist (physician) and Advanced Practice Providers (APPs -  Physician Assistants and Nurse Practitioners) who all work together to provide you with the care you need, when you need it.  We recommend signing up for the patient portal called "MyChart".  Sign up information is provided on this After Visit Summary.  MyChart is used to connect with patients for Virtual Visits (Telemedicine).  Patients are able to view lab/test results, encounter notes, upcoming appointments, etc.  Non-urgent messages can be sent to your provider as well.   To learn more about what you can do with MyChart, go to [email or web address].    Your next appointment:   6 month(s)  The format for your next appointment:   In Person  Provider:   Lorine Bears, MD   Other Instructions You have been referred to Pulmonology for sleep study eval. They will call you to schedule appointment.

## 2020-03-01 NOTE — Progress Notes (Signed)
Wyline Mood MD, MRCP(U.K) [address]  [address]  [place], [place] [address]  Main: [phone]  Fax: [phone]   Gastroenterology Consultation  Referring Provider:     Bud Face, MD Primary Care Physician:  Alba Cory, MD Primary Gastroenterologist:  Dr. Wyline Mood  Reason for Consultation:    Dysphagia        HPI:   Anna Whitehead is a 35 y.o. y/o female referred for consultation & management  by Dr. Carlynn Purl, Danna Hefty, MD.    She has been referred for dysphagia.  She underwent a C-section on 02/04/2020.  Recently been seen and evaluated for bradycardia following C-section.  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  She complains of coughing while eating.  History of heartburn.  Significantly improved after delivery.  Has tried Pepcid and Prilosec on and off in the past.  Results to these medications have been inconsistent.  No family history of esophageal cancer.  She is not a smoker.  Does not drink excess alcohol.  Presently not on any medication.  Does not complain of dysphagia but predominant symptoms is coughing while eating.  Past Medical History:  Diagnosis Date  . Allergic eczema   . Anxiety   . Depression   . Dysmenorrhea   . Family history of breast cancer    4/21 Cancer genetic testing letter sent  . Iron deficiency anemia 10/03/2019  . Iron deficiency anemia due to chronic blood loss   . Menorrhagia   . Midline low back pain with left-sided sciatica   . Mild asthma    seasonal, not used recently  . Overweight (BMI 25.0-29.9)   . Right foot pain   . Seasonal allergic rhinitis     Past Surgical History:  Procedure Laterality Date  . CESAREAN SECTION  02/04/2020   Procedure: CESAREAN SECTION;  Surgeon: Conard Novak, MD;  Location: [facility] ORS;  Service: Obstetrics;;    Prior to Admission medications   Medication Sig Start Date End Date Taking? Authorizing Provider  buPROPion  (WELLBUTRIN XL) 150 MG 24 hr tablet Take 1 tablet (150 mg total) by mouth daily. 02/27/20   Conard Novak, MD  escitalopram (LEXAPRO) 20 MG tablet Take 1 tablet (20 mg total) by mouth daily. 02/07/20   Nadara Mustard, MD    Family History  Problem Relation Age of Onset  . Bipolar disorder Mother   . Breast cancer Maternal Aunt 40  . Breast cancer Maternal Aunt 50  . Pancreatic cancer Maternal Aunt 70  . Ovarian cancer Maternal Great-grandmother      Social History   Tobacco Use  . Smoking status: Never Smoker  . Smokeless tobacco: Never Used  Vaping Use  . Vaping Use: Never used  Substance Use Topics  . Alcohol use: Not Currently    Alcohol/week: 0.0 standard drinks  . Drug use: No    Allergies as of 03/01/2020  . (No Known Allergies)    Review of Systems:    All systems reviewed and negative except where noted in HPI.   Physical Exam:  BP 101/65 (BP Location: Left Arm, Patient Position: Sitting, Cuff Size: Normal)   Pulse 76   Ht 5\' 10"  (1.778 m)   Wt 202 lb 12.8 oz (92 kg)   BMI 29.10 kg/m  No LMP recorded. Psych:  Alert and cooperative. Normal mood and affect. General:   Alert,  Well-developed, well-nourished, pleasant and cooperative in  NAD Head:  Normocephalic and atraumatic. Neurologic:  Alert and oriented x3;  grossly normal neurologically. Psych:  Alert and cooperative. Normal mood and affect.  Imaging Studies: ECHOCARDIOGRAM COMPLETE  Result Date: 02/06/2020    ECHOCARDIOGRAM REPORT   Patient Name:   Anna Whitehead Date of Exam: 02/06/2020 Medical Rec #:  [id number]      Height:       70.0 in Accession #:    [id number]     Weight:       221.0 lb Date of Birth:  Jan 27, 1986      BSA:          2.178 m Patient Age:    34 years       BP:           115/79 mmHg Patient Gender: F              HR:           48 bpm. Exam Location:  [facility] Procedure: 2D Echo, Color Doppler and Cardiac Doppler Indications:     R55 Syncope  History:         Patient has no prior  history of Echocardiogram examinations.                  Arrythmias:Bradycardia.  Sonographer:     Humphrey Rolls RDCS (AE) Referring Phys:  [id number] Tresa Endo A GRIFFITH Diagnosing Phys: Lorine Bears MD  Sonographer Comments: Suboptimal apical window and no subcostal window. IMPRESSIONS  1. Left ventricular ejection fraction, by estimation, is 60 to 65%. The left ventricle has normal function. The left ventricle has no regional wall motion abnormalities. Left ventricular diastolic parameters were normal.  2. Right ventricular systolic function is normal. The right ventricular size is normal. Tricuspid regurgitation signal is inadequate for assessing PA pressure.  3. The mitral valve is normal in structure. Trivial mitral valve regurgitation. No evidence of mitral stenosis.  4. The aortic valve is normal in structure. Aortic valve regurgitation is not visualized. No aortic stenosis is present.  5. The inferior vena cava is normal in size with greater than 50% respiratory variability, suggesting right atrial pressure of 3 mmHg. Conclusion(s)/Recommendation(s): Normal biventricular function without evidence of hemodynamically significant valvular heart disease. FINDINGS  Left Ventricle: Left ventricular ejection fraction, by estimation, is 60 to 65%. The left ventricle has normal function. The left ventricle has no regional wall motion abnormalities. The left ventricular internal cavity size was normal in size. There is  no left ventricular hypertrophy. Left ventricular diastolic parameters were normal. Right Ventricle: The right ventricular size is normal. No increase in right ventricular wall thickness. Right ventricular systolic function is normal. Tricuspid regurgitation signal is inadequate for assessing PA pressure. Left Atrium: Left atrial size was normal in size. Right Atrium: Right atrial size was normal in size. Pericardium: There is no evidence of pericardial effusion. Mitral Valve: The mitral valve is normal in  structure. Trivial mitral valve regurgitation. No evidence of mitral valve stenosis. MV peak gradient, 7.3 mmHg. The mean mitral valve gradient is 3.0 mmHg. Tricuspid Valve: The tricuspid valve is normal in structure. Tricuspid valve regurgitation is trivial. No evidence of tricuspid stenosis. Aortic Valve: The aortic valve is normal in structure. Aortic valve regurgitation is not visualized. No aortic stenosis is present. Aortic valve mean gradient measures 5.0 mmHg. Aortic valve peak gradient measures 10.9 mmHg. Aortic valve area, by VTI measures 2.94 cm. Pulmonic Valve: The pulmonic valve was normal in structure. Pulmonic valve regurgitation is not  visualized. No evidence of pulmonic stenosis. Aorta: The aortic root is normal in size and structure. Venous: The inferior vena cava is normal in size with greater than 50% respiratory variability, suggesting right atrial pressure of 3 mmHg. IAS/Shunts: No atrial level shunt detected by color flow Doppler.  LEFT VENTRICLE PLAX 2D LVIDd:         4.83 cm  Diastology LVIDs:         2.91 cm  LV e' medial:    12.00 cm/s LV PW:         1.07 cm  LV E/e' medial:  9.8 LV IVS:        0.90 cm  LV e' lateral:   17.50 cm/s LVOT diam:     2.10 cm  LV E/e' lateral: 6.7 LV SV:         98 LV SV Index:   45 LVOT Area:     3.46 cm  RIGHT VENTRICLE RV Basal diam:  3.11 cm LEFT ATRIUM             Index       RIGHT ATRIUM           Index LA diam:        3.40 cm 1.56 cm/m  RA Area:     13.10 cm LA Vol (A2C):   37.6 ml 17.27 ml/m RA Volume:   30.40 ml  13.96 ml/m LA Vol (A4C):   51.6 ml 23.69 ml/m LA Biplane Vol: 47.3 ml 21.72 ml/m  AORTIC VALVE                    PULMONIC VALVE AV Area (Vmax):    2.81 cm     PV Vmax:       1.15 m/s AV Area (Vmean):   2.70 cm     PV Vmean:      83.200 cm/s AV Area (VTI):     2.94 cm     PV VTI:        0.260 m AV Vmax:           165.00 cm/s  PV Peak grad:  5.3 mmHg AV Vmean:          106.000 cm/s PV Mean grad:  3.0 mmHg AV VTI:            0.335 m AV  Peak Grad:      10.9 mmHg AV Mean Grad:      5.0 mmHg LVOT Vmax:         134.00 cm/s LVOT Vmean:        82.600 cm/s LVOT VTI:          0.284 m LVOT/AV VTI ratio: 0.85  AORTA Ao Root diam: 3.40 cm MITRAL VALVE MV Area (PHT): 4.01 cm     SHUNTS MV Peak grad:  7.3 mmHg     Systemic VTI:  0.28 m MV Mean grad:  3.0 mmHg     Systemic Diam: 2.10 cm MV Vmax:       1.35 m/s MV Vmean:      72.8 cm/s MV Decel Time: 189 msec MV E velocity: 118.00 cm/s MV A velocity: 71.10 cm/s MV E/A ratio:  1.66 Lorine Bears MD Electronically signed by Lorine Bears MD Signature Date/Time: 02/06/2020/1:30:04 PM    Final     Assessment and Plan:   Charlestine Rookstool is a 34 y.o. y/o female has been referred for dysphagia.  Her predominant symptoms is coughing while  eating.  Does not really have symptoms suggestive of dysphagia.  History of heartburn which is very suggestive of acid reflux which was worse during pregnancy and significantly better after delivery.  Has taken PPIs on and off in the past.  Plan  1.  Modified barium swallow and barium swallow with tablet to rule out any transfer dysphagia and any Zenker's diverticulum. 2.  Trial of Prilosec 40 mg once a day.  She is not breast-feeding and does not intend to breast-feed. 3.  Counseled on lifestyle changes including use of a wedge pillow and avoiding food intake for 2 hours before bedtime. 4.  If no better will discuss about endoscopic evaluation.  She has been seen by cardiologist and says no issues of bradycardia at this point of time.  Follow up in 5 to 6 weeks telephone visit  Dr Wyline MoodKiran Cortavius Montesinos MD,MRCP(U.K)

## 2020-03-01 NOTE — Progress Notes (Signed)
Office Visit    Patient Name: Anna Whitehead Date of Encounter: 03/01/2020  Primary Care Provider:  Alba Cory, MD Primary Cardiologist:  Lorine Bears, MD  Chief Complaint    34 year old female with a history of anxiety, depression, iron deficiency anemia, childhood asthma, pulsatile tinnitus, and recurrent presyncope, who presents for follow-up after recent hospitalization during which she experienced bradycardia and presyncope after childbirth.  Past Medical History    Past Medical History:  Diagnosis Date  . Allergic eczema   . Anxiety   . Depression   . Dysmenorrhea   . Family history of breast cancer    4/21 Cancer genetic testing letter sent  . Iron deficiency anemia 10/03/2019  . Iron deficiency anemia due to chronic blood loss   . Menorrhagia   . Midline low back pain with left-sided sciatica   . Mild asthma    seasonal, not used recently  . Overweight (BMI 25.0-29.9)   . Right foot pain   . Seasonal allergic rhinitis    Past Surgical History:  Procedure Laterality Date  . CESAREAN SECTION  02/04/2020   Procedure: CESAREAN SECTION;  Surgeon: Conard Novak, MD;  Location: [facility] ORS;  Service: Obstetrics;;    Allergies  No Known Allergies  History of Present Illness    34 year old female with above past medical history including anxiety, depression, iron deficiency anemia, childhood asthma, pulsatile tinnitus, and recurrent presyncope.  Throughout her adulthood, she has had episodic presyncope associated diaphoresis, nausea, and vomiting.  In the setting of recent pregnancy, she had at least 5 episodes over a span of 9 months, often occurring while sitting and/or driving, causing her to pull over, associated with vomiting, and lasting 3 to 5 minutes.  She had never completely lost consciousness.  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  She was seen by neurosurgery at [facility] in early October and an MRA/MRV was negative for thrombosis.  She followed up with neurology November 17 who recommended delivery by C-section it was felt that the mechanics of vaginal delivery could potentially worsen herniation.  On November 20, she underwent C-section.  Postoperatively, she was noted to be bradycardic with rates frequently in the 40s and 50s.  She was generally symptomatic and was noted to have a rise in heart rates in ambulation.  On the morning of November 22, she was attempting to have her first postoperative bowel movement and while on the commode and in the setting of bearing down, she became diaphoretic, nauseated, and lightheaded.  She made it back to the bed by herself.  Nursing staff was alerted by her husband and she was noted to be pale.  Heart rate was 44 the blood pressure 140/86.  Twelve-lead ECG shows sinus bradycardia rate of 53 and no acute ST or T changes.  Symptoms improved after Zofran.  I consulted in the hospital.  She was bradycardic with rates trending in the 40s though good chronotropic competence noted with ambulation.  Echocardiogram showed normal LV function without significant valvular abnormalities.  At discharge a Zio AT was placed and this showed an average heart rate of 50 bpm with a minimum heart rate of 28 bpm occurring at approximately 5:30 in the morning and a maximum heart rate of 139.  Triggered events with complaints of lightheadedness, dizziness, anxiety, and blurred vision, were associated with sinus rhythm, sinus  tachycardia, sinus arrhythmia, and PVC.  Overall, she has felt well since discharge with the exception of triggered events on monitoring as outlined above.  She has not had any severe presyncope, nausea, or vomiting, which was what she was experiencing during pregnancy.  She denies chest pain, palpitations, PND, orthopnea, syncope, edema, or early satiety.  Her husband is  present with her today.  All questions answered and reassurance offered.  Home Medications    Prior to Admission medications   Medication Sig Start Date End Date Taking? Authorizing Provider  buPROPion (WELLBUTRIN XL) 150 MG 24 hr tablet Take 1 tablet (150 mg total) by mouth daily. 02/27/20   Conard Novak, MD  doxylamine, Sleep, (UNISOM) 25 MG tablet Take 25 mg by mouth at bedtime as needed. Patient not taking: Reported on 02/27/2020    [provider]  escitalopram (LEXAPRO) 20 MG tablet Take 1 tablet (20 mg total) by mouth daily. 02/07/20   Nadara Mustard, MD  ondansetron (ZOFRAN ODT) 4 MG disintegrating tablet Take 1 tablet (4 mg total) by mouth every 6 (six) hours as needed for nausea. 09/21/19   Nadara Mustard, MD  oxyCODONE-acetaminophen (PERCOCET/ROXICET) 5-325 MG tablet Take 1 tablet by mouth every 4 (four) hours as needed for moderate pain (pain score 4-7/10). Patient not taking: Reported on 02/27/2020 02/07/20   Nadara Mustard, MD  Prenatal Multivit-Min-Fe-FA (PRENATAL, W/IRON & FA,) 27-0.8 MG TABS Take 1 tablet by mouth daily.    [provider]  Pyridoxine HCl (VITAMIN B-6) 250 MG tablet Take 250 mg by mouth daily.    [provider]    Review of Systems    She did have episodes of anxiety, lightheadedness, blurred vision, anxiety, and dizziness during monitoring period.  She denies chest pain, dyspnea, palpitations, PND, orthopnea, syncope, edema, or early satiety.  All other systems reviewed and are otherwise negative except as noted above.  Physical Exam    VS:  BP 120/70 (BP Location: Left Arm, Patient Position: Sitting, Cuff Size: Normal)   Pulse (!) 52   Ht 5\' 10"  (1.778 m)   Wt 202 lb (91.6 kg)   SpO2 97%   BMI 28.98 kg/m  , BMI Body mass index is 28.98 kg/m. GEN: Well nourished, well developed, in no acute distress. HEENT: normal. Neck: Supple, no JVD, carotid bruits, or masses. Cardiac: RRR, no murmurs, rubs, or gallops. No  clubbing, cyanosis, edema.  Radials/PT 2+ and equal bilaterally.  Respiratory:  Respirations regular and unlabored, clear to auscultation bilaterally. GI: Soft, nontender, nondistended, BS + x 4. MS: no deformity or atrophy. Skin: warm and dry, no rash. Neuro:  Strength and sensation are intact. Psych: Normal affect.  Accessory Clinical Findings    ECG personally reviewed by me today -sinus bradycardia, 52, normal intervals, no acute ST or T changes - no acute changes.  Lab Results  Component Value Date   WBC 8.7 02/07/2020   HGB 10.6 (L) 02/07/2020   HCT 33.1 (L) 02/07/2020   MCV 95.9 02/07/2020   PLT 155 02/07/2020   Lab Results  Component Value Date   CREATININE 0.80 02/07/2020   BUN 6 02/07/2020   NA 136 02/07/2020   K 4.3 02/07/2020   CL 103 02/07/2020   CO2 27 02/07/2020   Lab Results  Component Value Date   ALT 19 02/05/2020   AST 28 02/05/2020   ALKPHOS 88 02/05/2020   BILITOT 0.6 02/05/2020    Lab Results  Component Value Date  HGBA1C 5.1 07/18/2016    Assessment & Plan    1.  Presyncope/vasovagal response: Patient with multiple episodes of presyncope during pregnancy followed by a recurrent episode in the setting of sinus bradycardia following C-section and specifically occurring during her first postoperative bowel movement.  During hospitalization, heart rates were in the 40s with normal chronotropic response with ambulation.  Lab work during hospitalization unremarkable.  Echo showed normal LV function without any significant valvular abnormalities.  Follow-up outpatient monitoring notable for an average heart rate 50 bpm with triggered events described as anxiety, lightheadedness, dizziness, and blurred vision being associated with sinus rhythm, sinus tachycardia, sinus arrhythmia, and PVC.  She was noted to have sinus bradycardia down to 28 bpm at approximately 5:30 in the morning and will be referred for sleep evaluation.  Reassurance offered.  We discussed  high vagal tone and prophylactic measures to be taken including adequate hydration, salt liberalization, potentially compression stockings, and avoidance of bearing down/straining.  2.  Nocturnal bradycardia: Rate down to 28 during hours of sleep.  No evidence of high-grade heart block.  Referring sleep study.  3.  Disposition: Referring for sleep study.  Follow-up in 6 months or sooner if necessary.  Nicolasa Ducking, NP 03/01/2020, 1:05 PM

## 2020-03-01 NOTE — Patient Instructions (Signed)

## 2020-03-02 ENCOUNTER — Other Ambulatory Visit: Payer: Self-pay

## 2020-03-02 NOTE — Progress Notes (Signed)
  No epileptiform activity results.  The patient was able to enter drowsiness non-REM sleep stage I toward the end of this recording at about 17 minutes 50 seconds.   ECG remained in NSR. Conclusion: this is a normal EEG for the patient's age and conscious state.     Melvyn Novas, MD Diplomat, American Board of Psychiatry and Neurology  Diplomat, Biomedical engineer of Sleep Medicine Wellsite geologist, [employer] [facility] [employer]

## 2020-03-02 NOTE — Procedures (Signed)
EEG study from 27 February 2020 was a complete morning time of 25 minutes 5 seconds.  This EEG is including hyperventilation and photic stimulation maneuvers.  The patient's level of alertness is awake and drowsy.  Technical aspects this EEG study was performed under the international electrode placement agreement by the 10-20 system an EKG electrode is also included in this 18 channel recording.  Posterior background activity consists of 9 Hz with moderate high-voltage seen predominantly in the posterior head regions and promptly attenuating with eye opening.   Hyperventilation leads to amplitude buildup without a change in frequency.   After hyperventilation the EEG remained symmetric and without any epileptiform discharges for another 2 minutes.   Photic stimulation is then performed with noticeable entrainment of all frequencies.  No epileptiform activity results.  The patient was able to enter drowsiness non-REM sleep stage I toward the end of this recording at about 17 minutes 50 seconds.   ECG remained in NSR. Conclusion: this is a normal EEG for the patient's age and conscious state.

## 2020-03-05 ENCOUNTER — Other Ambulatory Visit: Payer: Self-pay

## 2020-03-05 ENCOUNTER — Encounter: Payer: Self-pay | Admitting: Neurology

## 2020-03-05 ENCOUNTER — Ambulatory Visit
Admission: RE | Admit: 2020-03-05 | Discharge: 2020-03-05 | Disposition: A | Discharge status: Home / Self Care | Payer: BC Managed Care – PPO | Source: Ambulatory Visit | Attending: Neurology | Admitting: Neurology

## 2020-03-05 DIAGNOSIS — Z3A39 39 weeks gestation of pregnancy: Secondary | ICD-10-CM

## 2020-03-05 DIAGNOSIS — O09299 Supervision of pregnancy with other poor reproductive or obstetric history, unspecified trimester: Secondary | ICD-10-CM

## 2020-03-05 DIAGNOSIS — R930 Abnormal findings on diagnostic imaging of skull and head, not elsewhere classified: Secondary | ICD-10-CM

## 2020-03-05 DIAGNOSIS — O0990 Supervision of high risk pregnancy, unspecified, unspecified trimester: Secondary | ICD-10-CM

## 2020-03-05 IMAGING — CT CT HEAD W/O CM
3 series · 15 of 46 positions shown, 18 images · non-contrast
Comparison: MR head and MR venogram [DATE] and [DATE].

CLINICAL DATA: Abnormal MRI.

EXAM:
CT HEAD WITHOUT CONTRAST
TECHNIQUE: Contiguous axial images were obtained from the base of the skull
through the vertex without intravenous contrast.

[Series 2: head wo · axial · 0.41mm/px · z∈[-53,+67]mm · 9 of 29 slices shown, 12 images]
[im 3/29  brain]
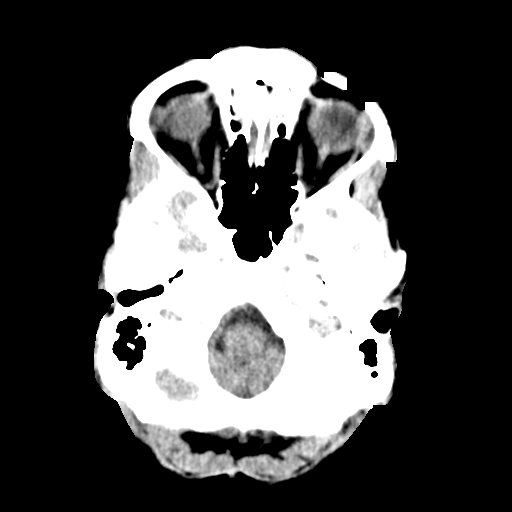
[im 3/29  bone]
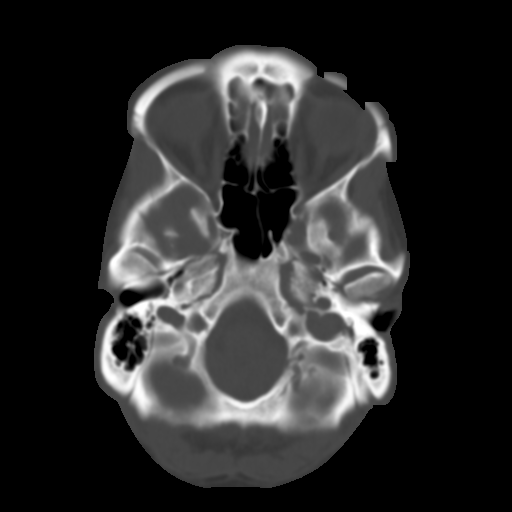
[im 6/29  brain]
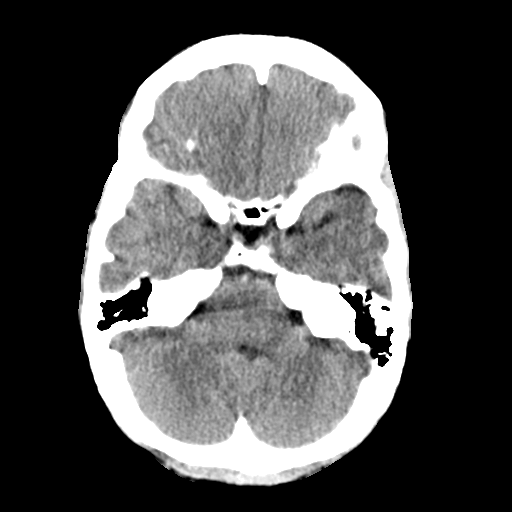
[im 9/29  brain]
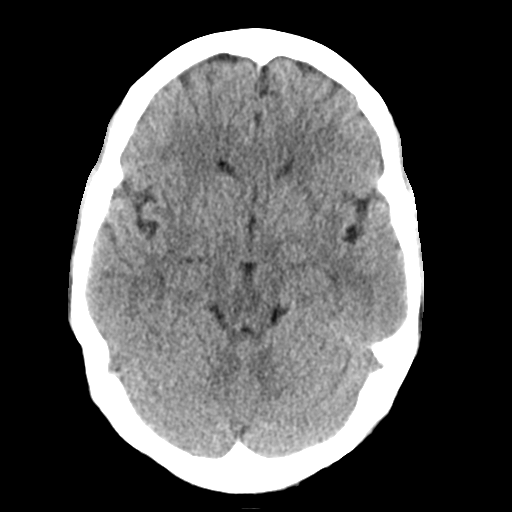
[im 12/29  brain]
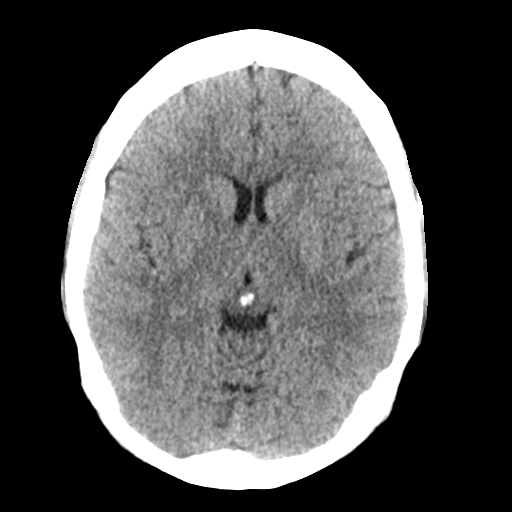
[im 15/29  brain]
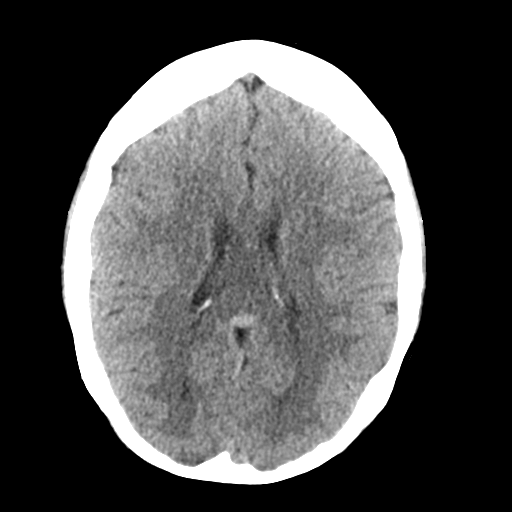
[im 15/29  bone]
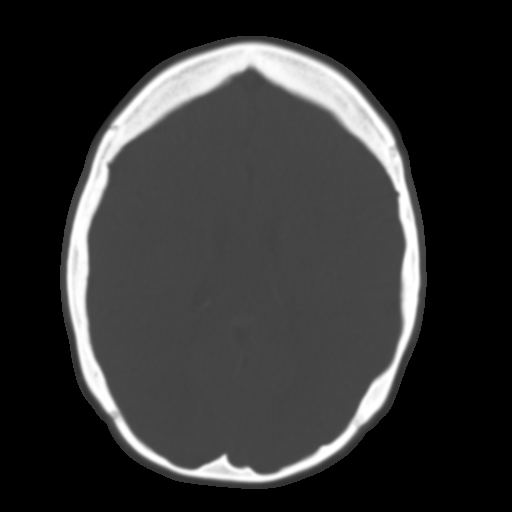
[im 18/29  brain]
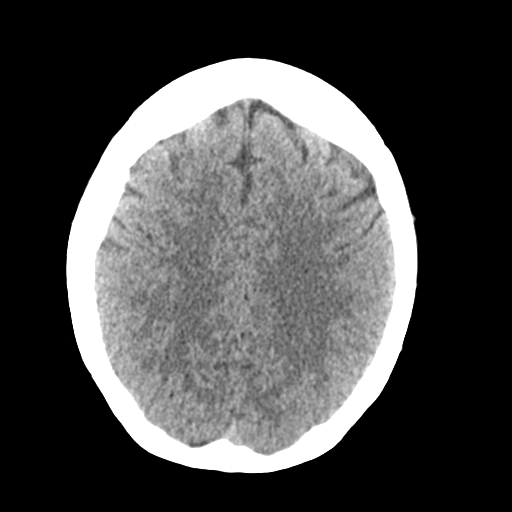
[im 21/29  brain]
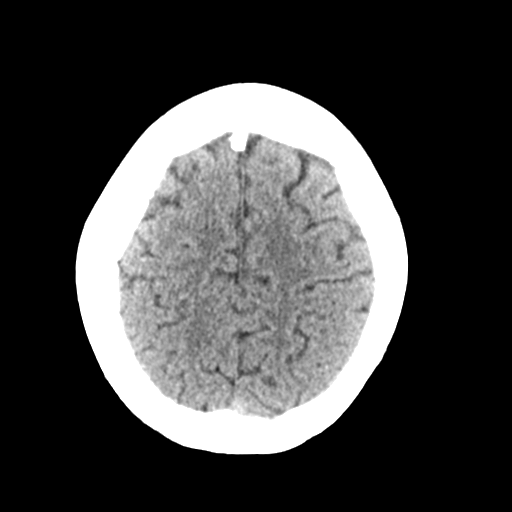
[im 24/29  brain]
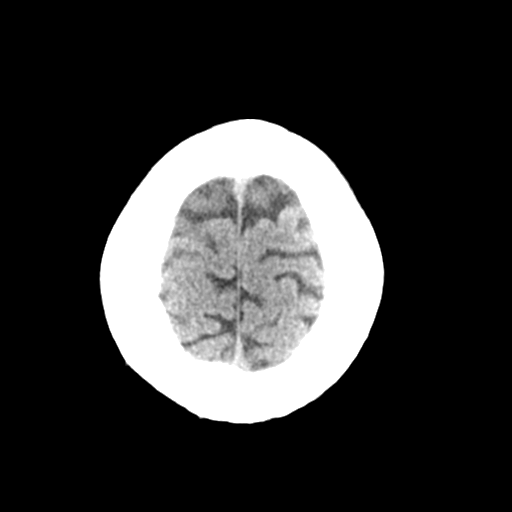
[im 27/29  brain]
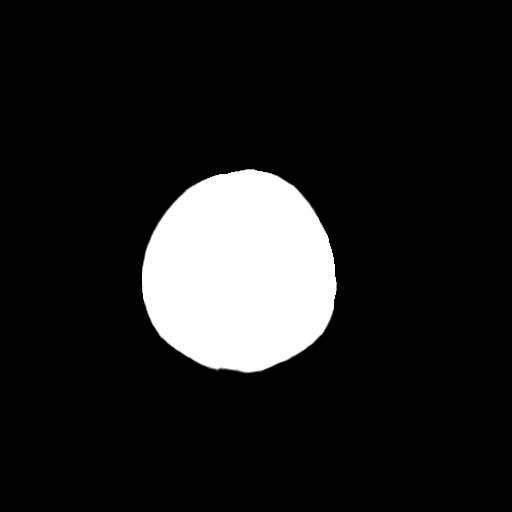
[im 27/29  bone]
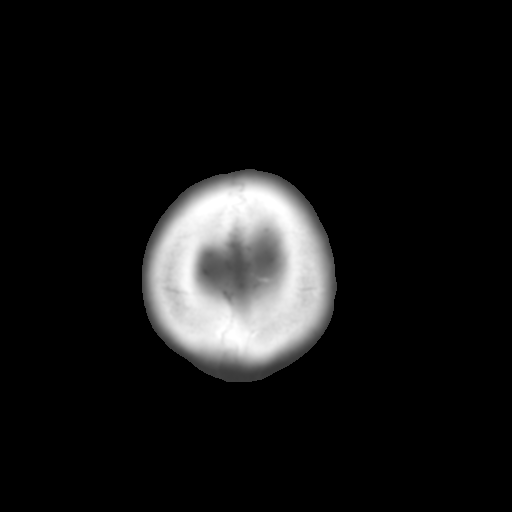

[Series 4: coronal soft tissue · coronal · 0.31mm/px · 3 of 67 slices shown]
[im 23/67  brain]
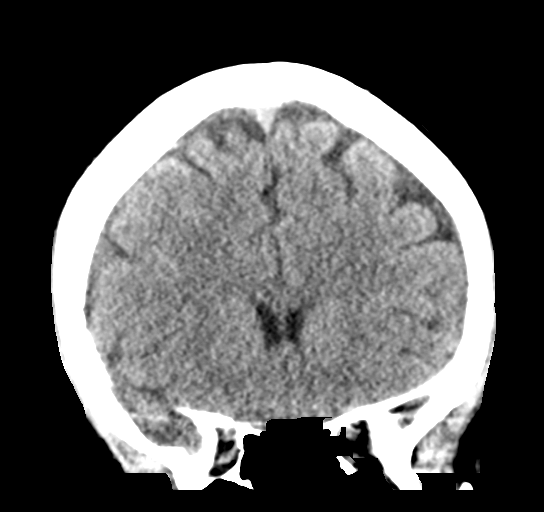
[im 30/67  brain]
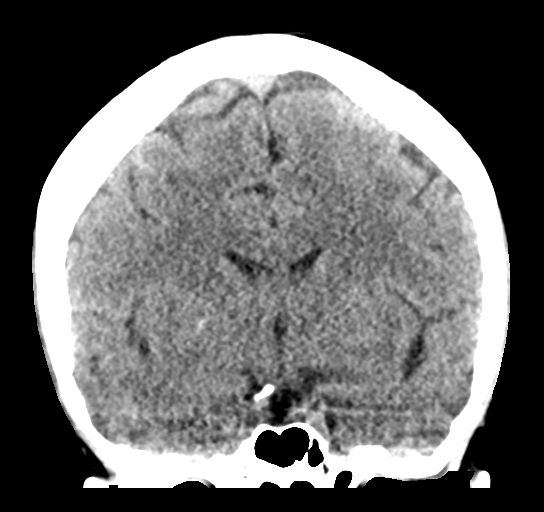
[im 37/67  brain]
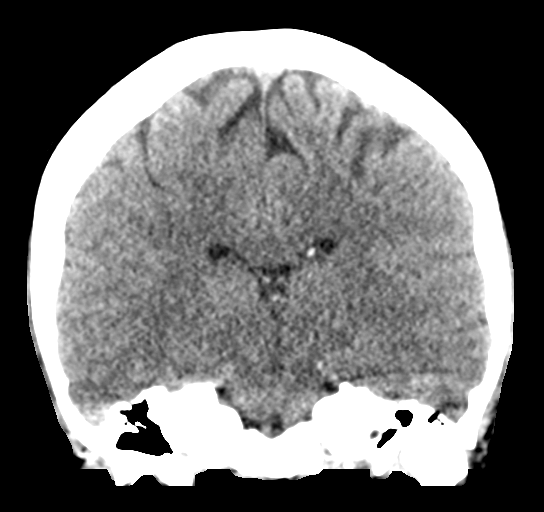

[Series 5: sagittal soft tissue · sagittal · 0.31mm/px · 3 of 53 slices shown]
[im 18/53  brain]
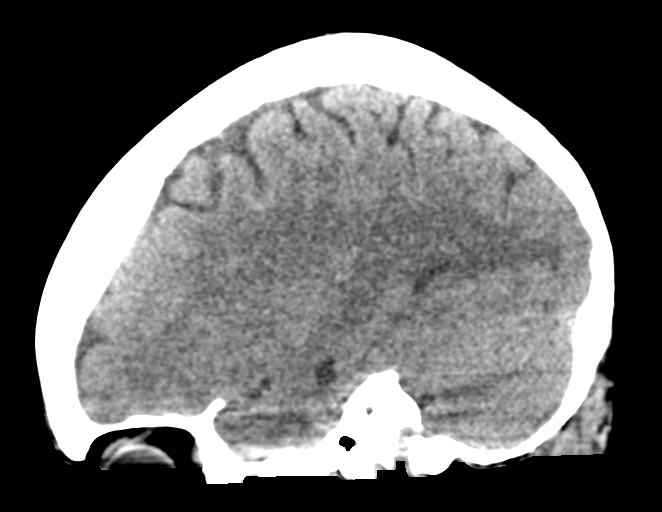
[im 27/53  brain]
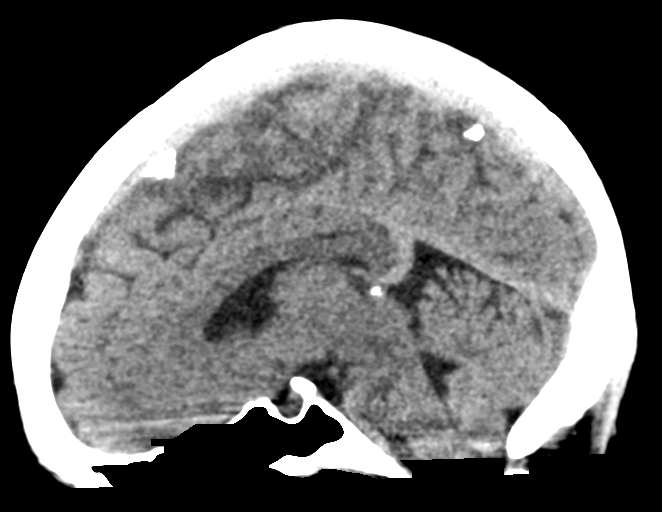
[im 35/53  brain]
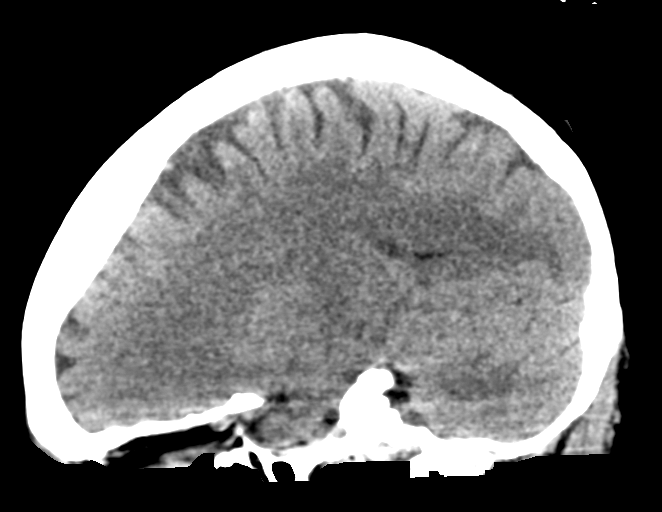

[15 of 46 positions shown; findings below may reference images not displayed]

FINDINGS: Brain: Encroachment of the right sphenoid sinus is previously
described is less well appreciated by CT. No acute infarct,
hemorrhage, or mass lesion is present. No significant white matter
lesions are present. The ventricles are of normal size. No
significant extraaxial fluid collection is present.

Vascular: No hyperdense vessel or unexpected calcification.

Skull: Calvarium is intact. No focal lytic or blastic lesions are
present. No significant extracranial soft tissue lesion is present.

Sinuses/Orbits: The paranasal sinuses and mastoid air cells are
clear. The globes and orbits are within normal limits.
IMPRESSION: 1. Normal CT appearance of the brain.
2. Focal area of temporal lobe extending into the right transverse
sinus is not well appreciated by CT. No focal osseous abnormality.

## 2020-03-07 ENCOUNTER — Ambulatory Visit
Admission: RE | Admit: 2020-03-07 | Discharge: 2020-03-07 | Disposition: A | Discharge status: Home / Self Care | Payer: BC Managed Care – PPO | Source: Ambulatory Visit | Attending: Gastroenterology | Admitting: Gastroenterology

## 2020-03-07 ENCOUNTER — Other Ambulatory Visit: Payer: Self-pay

## 2020-03-07 DIAGNOSIS — T17208A Unspecified foreign body in pharynx causing other injury, initial encounter: Secondary | ICD-10-CM | POA: Diagnosis present

## 2020-03-07 IMAGING — RF DG ESOPHAGUS
10 of 14 series · 14 of 24 positions shown · non-contrast
Comparison: None.

CLINICAL DATA: Dysphagia

EXAM:
ESOPHOGRAM / BARIUM SWALLOW / BARIUM TABLET STUDY
TECHNIQUE: Combined double contrast and single contrast examination performed
using effervescent crystals, thick barium liquid, and thin barium
liquid. The patient was observed with fluoroscopy swallowing a 13 mm
barium sulphate tablet.
FLUOROSCOPY TIME:  Fluoroscopy Time:  36 seconds
Radiation Exposure Index (if provided by the fluoroscopic device):
3.6 mGy
Number of Acquired Spot Images: 0

[Series 1: cp_standard · 0.25mm/px · 2 of 21 frames shown (1 of 10)]
[frame 4/21]
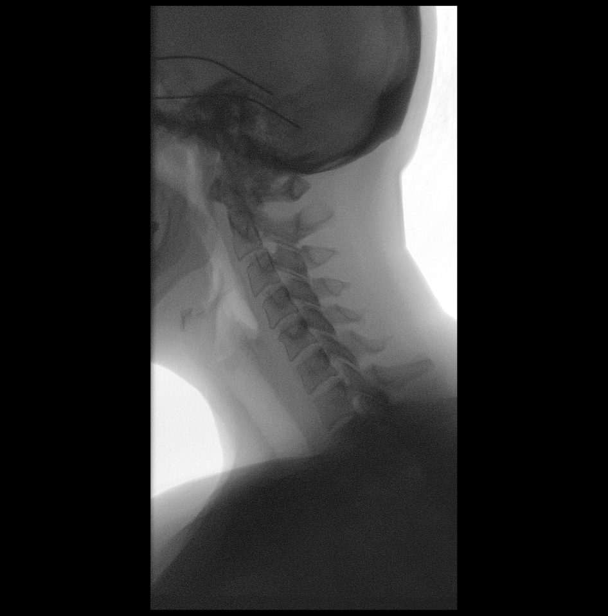
[frame 18/21]
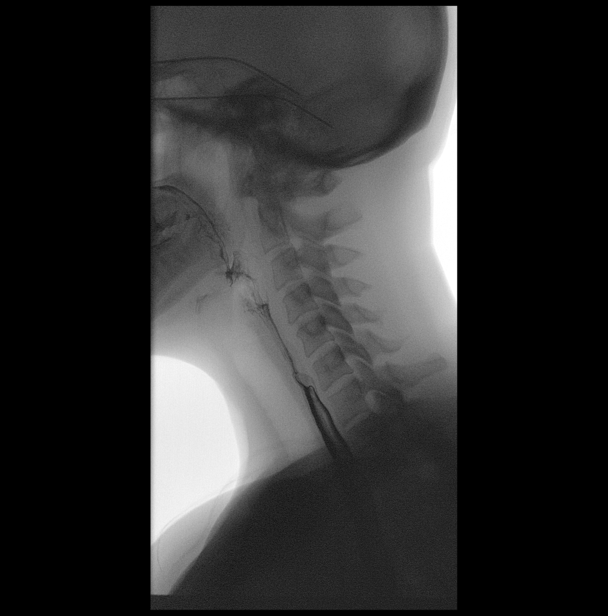

[Series 2: cp_standard · 0.25mm/px · 2 of 15 frames shown (2 of 10)]
[frame 3/15]
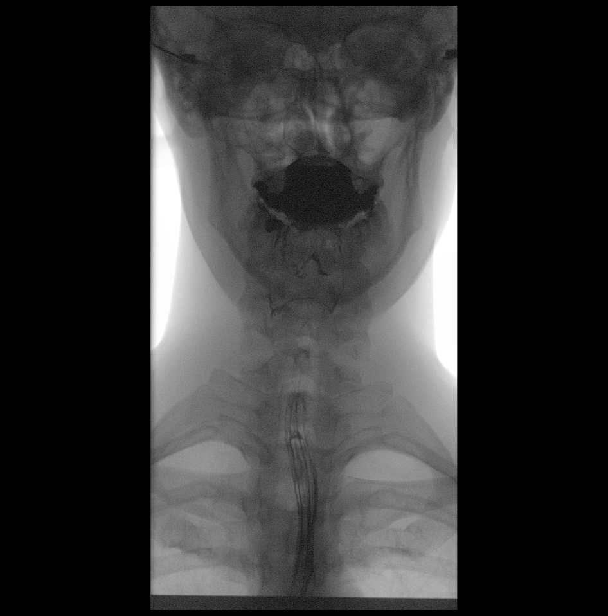
[frame 13/15]
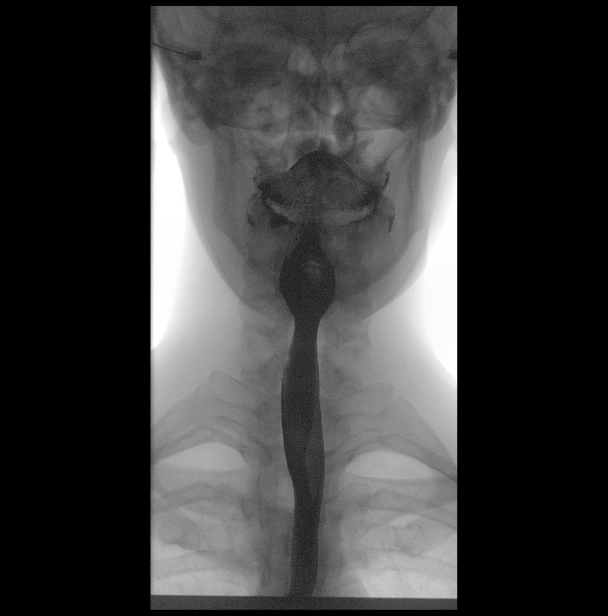

[Series 3: cp_standard · 0.25mm/px · 1 of 1 slices shown (3 of 10)]
[im 1/1]
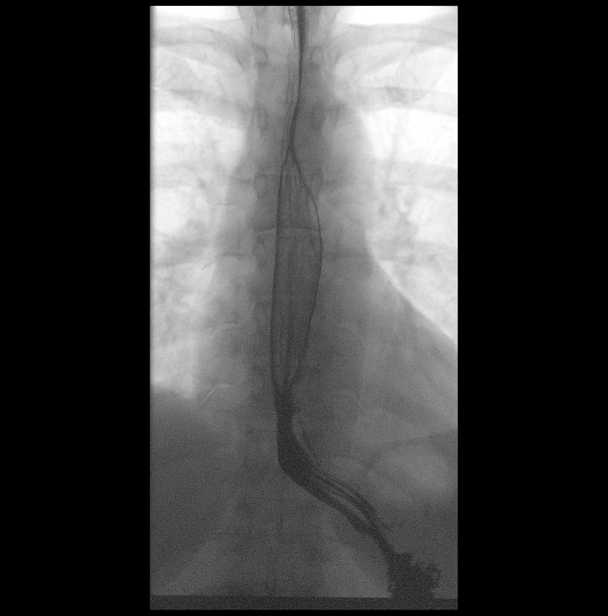

[Series 5: cp_standard · 0.25mm/px · 1 of 1 slices shown (4 of 10)]
[im 1/1]
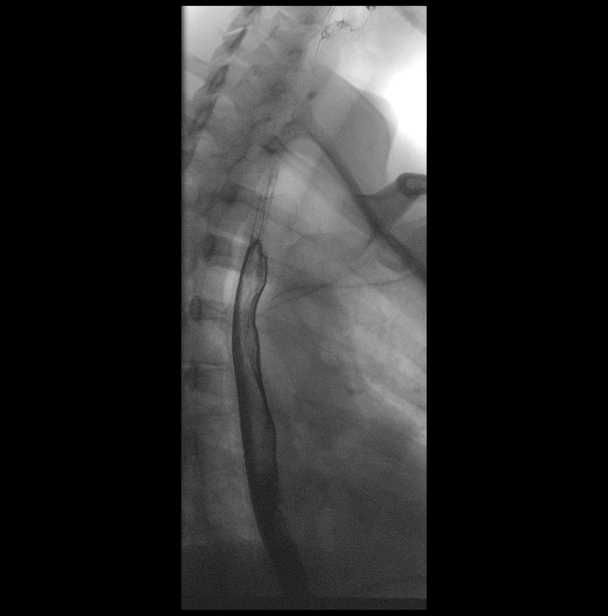

[Series 6: cp_standard · 0.25mm/px · 2 of 31 frames shown (5 of 10)]
[frame 16/31]
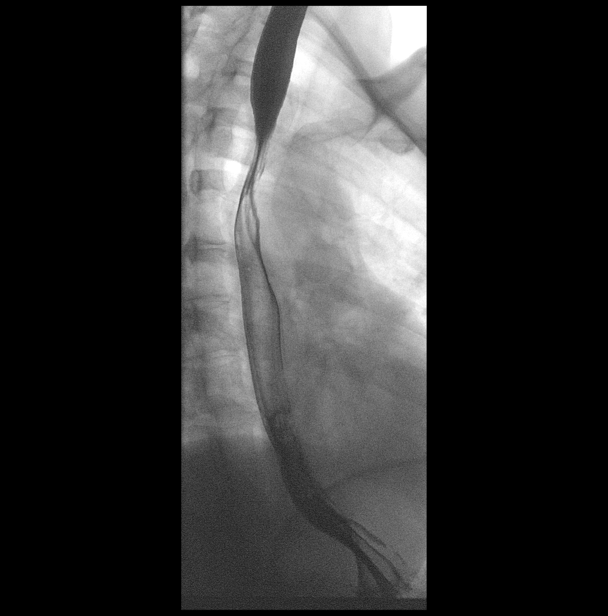
[frame 21/31]
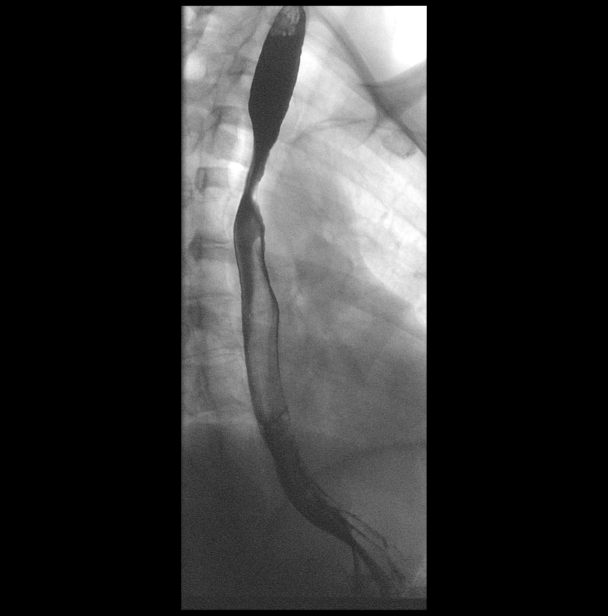

[Series 7: cp_standard · 0.25mm/px · 1 of 1 slices shown (6 of 10)]
[im 1/1]
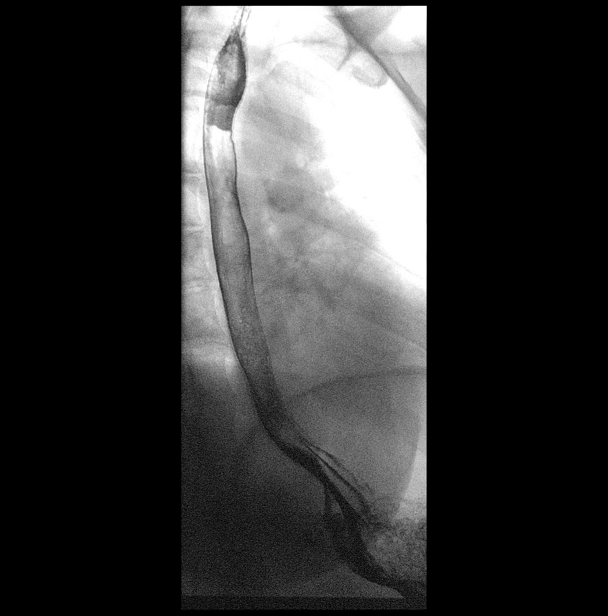

[Series 9: cp_standard · 0.17mm/px · 1 of 1 slices shown (7 of 10)]
[im 1/1]
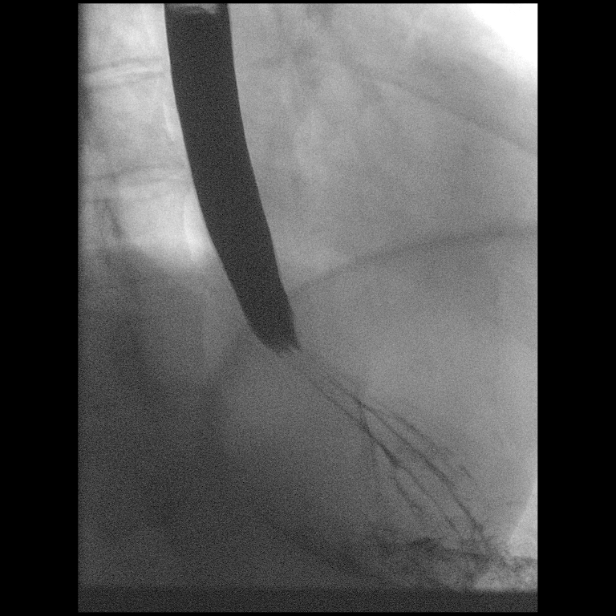

[Series 10: cp_standard · 0.17mm/px · 2 of 17 frames shown (8 of 10)]
[frame 12/17]
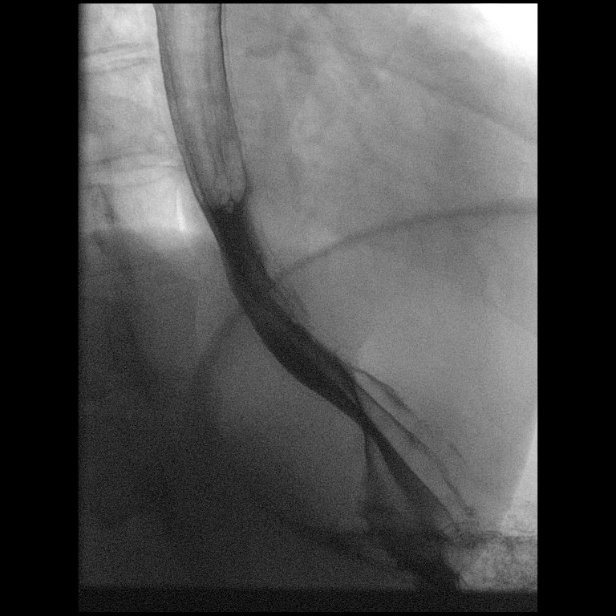
[frame 15/17]
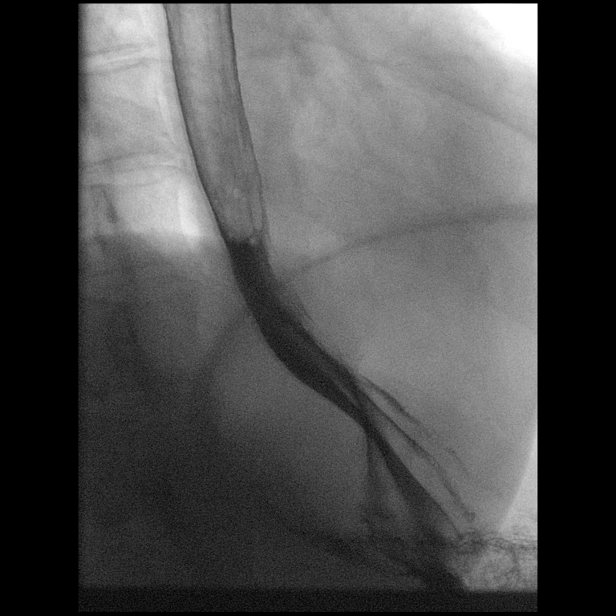

[Series 12: cp_standard · 0.28mm/px · 1 of 1 slices shown (9 of 10)]
[im 1/1]
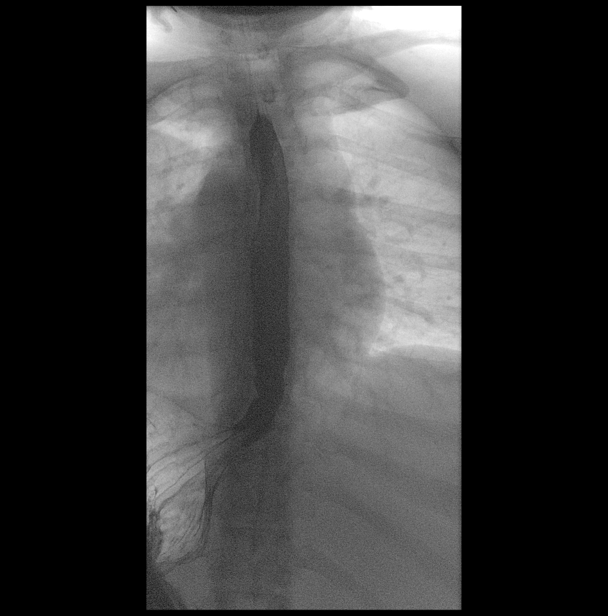

[Series 14: cp_standard · 0.28mm/px · 1 of 1 slices shown (10 of 10)]
[im 1/1]
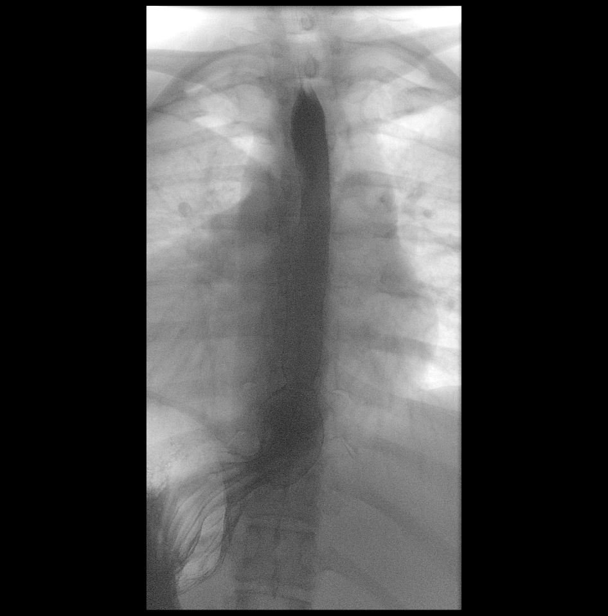

[14 of 24 positions shown; findings below may reference images not displayed]

FINDINGS: Normal pharyngeal anatomy and motility. Contrast flowed freely
through the esophagus without evidence of a stricture or mass.
Normal esophageal mucosa without evidence of irregularity or
ulceration. Esophageal motility was normal. Mild gastroesophageal
reflux. No definite hiatal hernia was demonstrated.

At the end of the examination a 13 mm barium tablet was administered
which transited through the esophagus and esophagogastric junction
without delay.
IMPRESSION: 1. Mild gastroesophageal reflux.
2. Otherwise no barium swallow.

## 2020-03-08 ENCOUNTER — Inpatient Hospital Stay: Payer: BC Managed Care – PPO

## 2020-03-08 ENCOUNTER — Telehealth: Payer: Self-pay | Admitting: *Deleted

## 2020-03-08 NOTE — Telephone Encounter (Signed)
-----   Message from Melvyn Novas, MD sent at 03/02/2020  4:42 PM EST -----  No epileptiform activity results.  The patient was able to enter drowsiness non-REM sleep stage I toward the end of this recording at about 17 minutes 50 seconds.   ECG remained in NSR. Conclusion: this is a normal EEG for the patient's age and conscious state.     Melvyn Novas, MD Diplomat, American Board of Psychiatry and Neurology  Diplomat, Biomedical engineer of Sleep Medicine Wellsite geologist, [employer] [facility] [employer]

## 2020-03-12 ENCOUNTER — Ambulatory Visit
Admission: RE | Admit: 2020-03-12 | Discharge: 2020-03-12 | Disposition: A | Discharge status: Home / Self Care | Payer: BC Managed Care – PPO | Source: Ambulatory Visit | Attending: Gastroenterology | Admitting: Gastroenterology

## 2020-03-12 ENCOUNTER — Inpatient Hospital Stay: Payer: BC Managed Care – PPO | Admitting: Oncology

## 2020-03-12 ENCOUNTER — Other Ambulatory Visit: Payer: Self-pay

## 2020-03-12 DIAGNOSIS — T17208A Unspecified foreign body in pharynx causing other injury, initial encounter: Secondary | ICD-10-CM | POA: Diagnosis present

## 2020-03-12 NOTE — Therapy (Signed)
[facility] DIAGNOSTIC RADIOLOGY [address] [place], [place], [address] Phone: [phone]   Fax:     Modified Barium Swallow  Patient Details  Name: Anna Whitehead MRN: [id number] Date of Birth: 02-19-1986 No data recorded  Encounter Date: 03/12/2020   End of Session - 03/12/20 1331    Visit Number 1    Number of Visits 1    Date for SLP Re-Evaluation 03/12/20    SLP Start Time 1302    SLP Stop Time  1320    SLP Time Calculation (min) 18 min    Activity Tolerance Patient tolerated treatment well           Past Medical History:  Diagnosis Date  . Allergic eczema   . Anxiety   . Depression   . Dysmenorrhea   . Family history of breast cancer    4/21 Cancer genetic testing letter sent  . Iron deficiency anemia 10/03/2019  . Iron deficiency anemia due to chronic blood loss   . Menorrhagia   . Midline low back pain with left-sided sciatica   . Mild asthma    seasonal, not used recently  . Overweight (BMI 25.0-29.9)   . Right foot pain   . Seasonal allergic rhinitis     Past Surgical History:  Procedure Laterality Date  . CESAREAN SECTION  02/04/2020   Procedure: CESAREAN SECTION;  Surgeon: Conard Novak, MD;  Location: [facility] ORS;  Service: Obstetrics;;      Objective Swallowing Evaluation: Type of Study: MBS-Modified Barium Swallow Study   Patient Details  Name: Breindy Meadow MRN: [id number] Date of Birth: 07-28-1985  Today's Date: 03/13/2020 Time: No data recorded-No data recorded No data recorded   HPI: Jaclin Finks is a 34 y.o. female referred for MBS by Dr. Wyline Mood due to complaints of dysphagia. Past medical history is noted for anxiety, depression, acid reflux. Recently gave birth via c-section in November 2021. Had barium swallow on 03/07/20 with findings of mild gastroesophageal reflux. She reports coughing at meals with solids only; typically fibrous fruits with peel or hulled  vegetables.   Subjective: reports coughing while eating, ongoing since childhood, was exacerbated during pregnancy    Assessment / Plan / Recommendation  CHL IP CLINICAL IMPRESSIONS 03/12/2020  Clinical Impression Patient presents with normal oropharyngeal swallow, with no penetration or aspiration observed, however there is appearance of prominent tissue superior right pharyngeal wall which appears to divert bolus flow to the left. MD may wish to consider repeat ENT consult with direct laryngoscopy or CT soft tissue neck for further imaging. Oral stage is characterized by adequate oral manipulation, rotary mastication, and swift anterior to posterior transfer. Pharyngeal swallow initiation is timely at the level of the base of tongue. Pharyngeal stage is characterized by strong base of tongue retraction, hyolaryngeal excursion, and pharyngeal constriction. Epiglottic deflection is complete; there is good airway protection with no observed laryngeal penetration or aspiration of any consistency. Pharyngeal stripping is complete, with no residue remaining post-swallow. Amplitude and duration of cricopharyngeal opening is within normal limits. All boluses, including 13 mm barium tablet,  passed through the pharynx and cervical esophagus without difficulty. Study results reviewed with patient after completion, and handout regarding management of reflux symptoms was provided.  SLP Visit Diagnosis Dysphagia, unspecified (R13.10)  Attention and concentration deficit following --  Frontal lobe and executive function deficit following --  Impact on safety and function No limitations      CHL IP TREATMENT  RECOMMENDATION 03/12/2020  Treatment Recommendations No treatment recommended at this time     No flowsheet data found.  CHL IP DIET RECOMMENDATION 03/12/2020  SLP Diet Recommendations Regular solids;Thin liquid  Liquid Administration via Straw;Cup  Medication Administration Whole meds with liquid   Compensations Small sips/bites;Slow rate;Follow solids with liquid  Postural Changes Remain semi-upright after after feeds/meals (Comment);Seated upright at 90 degrees      CHL IP OTHER RECOMMENDATIONS 03/12/2020  Recommended Consults --  Oral Care Recommendations Oral care BID  Other Recommendations --      CHL IP FOLLOW UP RECOMMENDATIONS 03/12/2020  Follow up Recommendations None      No flowsheet data found.         CHL IP ORAL PHASE 03/12/2020  Oral Phase WFL  Oral - Pudding Teaspoon --  Oral - Pudding Cup --  Oral - Honey Teaspoon --  Oral - Honey Cup --  Oral - Nectar Teaspoon --  Oral - Nectar Cup --  Oral - Nectar Straw --  Oral - Thin Teaspoon --  Oral - Thin Cup --  Oral - Thin Straw --  Oral - Puree --  Oral - Mech Soft --  Oral - Regular --  Oral - Multi-Consistency --  Oral - Pill --  Oral Phase - Comment --    CHL IP PHARYNGEAL PHASE 03/12/2020  Pharyngeal Phase WFL  Pharyngeal- Pudding Teaspoon --  Pharyngeal --  Pharyngeal- Pudding Cup --  Pharyngeal --  Pharyngeal- Honey Teaspoon --  Pharyngeal --  Pharyngeal- Honey Cup --  Pharyngeal --  Pharyngeal- Nectar Teaspoon --  Pharyngeal --  Pharyngeal- Nectar Cup [facility]  Pharyngeal Material does not enter airway  Pharyngeal- Nectar Straw --  Pharyngeal --  Pharyngeal- Thin Teaspoon --  Pharyngeal --  Pharyngeal- Thin Cup [facility]  Pharyngeal Material does not enter airway  Pharyngeal- Thin Straw WFL  Pharyngeal Material does not enter airway  Pharyngeal- Puree WFL  Pharyngeal Material does not enter airway  Pharyngeal- Mechanical Soft WFL  Pharyngeal Material does not enter airway  Pharyngeal- Regular --  Pharyngeal --  Pharyngeal- Multi-consistency --  Pharyngeal --  Pharyngeal- Pill WFL  Pharyngeal Material does not enter airway  Pharyngeal Comment --     CHL IP CERVICAL ESOPHAGEAL PHASE 03/12/2020  Cervical Esophageal Phase WFL  Pudding Teaspoon --  Pudding Cup --  Honey Teaspoon --   Honey Cup --  Nectar Teaspoon --  Nectar Cup --  Nectar Straw --  Thin Teaspoon --  Thin Cup --  Thin Straw --  Puree --  Mechanical Soft --  Regular --  Multi-consistency --  Pill --  Cervical Esophageal Comment --     Arlana Lindau 03/13/2020, 8:22 AM       Rondel Baton, MS, CCC-SLP Speech-Language Pathologist       Oropharyngeal aspiration, initial encounter - Plan: DG SWALLOW FUNC SPEECH PATH, DG SWALLOW FUNC SPEECH PATH        Problem List Patient Active Problem List   Diagnosis Date Noted  . Bradycardia following surgery 02/05/2020  . Labor and delivery, indication for care 02/04/2020  . Abnormal MRI of head 02/04/2020  . [redacted] weeks gestation of pregnancy   . Anemia during pregnancy in second trimester 10/03/2019  . Obesity affecting pregnancy 08/29/2019  . Rh negative state in antepartum period 08/29/2019  . Family history of breast cancer 07/18/2019  . Family history of pancreatic cancer 07/18/2019  . Supervision of high risk pregnancy, antepartum 06/16/2019  .  Hx of preeclampsia, prior pregnancy, currently pregnant 06/16/2019  . Depression, major, recurrent, moderate (HCC) 07/18/2016  . B12 deficiency 07/18/2016  . Vitamin D insufficiency 07/18/2016  . History of gestational hypertension 11/14/2015  . Asthma, mild 10/13/2014  . H/O high risk medication treatment 10/13/2014  . Overweight 10/13/2014  . Anxiety and depression 10/13/2014    Arlana Lindau 03/13/2020, 8:22 AM  [place] [facility] DIAGNOSTIC RADIOLOGY [address] [place], [place], [address] Phone: [phone]   Fax:     Name: Jenicka Coxe MRN: [id number] Date of Birth: 18-Apr-1985

## 2020-03-13 ENCOUNTER — Inpatient Hospital Stay: Payer: BC Managed Care – PPO

## 2020-03-13 ENCOUNTER — Telehealth: Payer: Self-pay

## 2020-03-13 NOTE — Telephone Encounter (Signed)
-----   Message from Wyline Mood, MD sent at 03/13/2020  9:42 AM EST ----- Manuela Neptune inform patient that the swallow study noted an abnormality in the back of the throat and suggest we get a CT soft tissue neck, after the scan will neeed either office visit or virtual visit

## 2020-03-13 NOTE — Telephone Encounter (Signed)
Called pt to inform of results and Dr. Anna's recommendations.  Unable to contact, LVM to return call 

## 2020-03-14 ENCOUNTER — Other Ambulatory Visit: Payer: Self-pay

## 2020-03-14 DIAGNOSIS — T17208A Unspecified foreign body in pharynx causing other injury, initial encounter: Secondary | ICD-10-CM

## 2020-03-14 NOTE — Addendum Note (Signed)
Addended by: Daisy Blossom on: 03/14/2020 03:11 PM   Modules accepted: Orders

## 2020-03-14 NOTE — Telephone Encounter (Signed)
-----   Message from Wyline Mood, MD sent at 03/14/2020 10:00 AM EST ----- Anna Whitehead   The speech and language therapist notes from 03/12/2020 suggested there was an abnormality at the back of the throat which she noted.  I believe we have ordered the CT scan of the neck.  Which is pending.  I also was thinking about it and feel we should refer her back to see her ENT physician to evaluate the abnormal area seen by the speech and language therapist for direct visualization with optical endoscopy.  Please make a referral  Hali Marry, MD   Dr Wyline Mood MD,MRCP [facility]) Gastroenterology/Hepatology Pager: [phone]

## 2020-03-15 ENCOUNTER — Other Ambulatory Visit: Payer: Self-pay | Admitting: Family Medicine

## 2020-03-15 DIAGNOSIS — R933 Abnormal findings on diagnostic imaging of other parts of digestive tract: Secondary | ICD-10-CM

## 2020-03-20 ENCOUNTER — Other Ambulatory Visit: Payer: Self-pay

## 2020-03-20 ENCOUNTER — Encounter: Payer: Self-pay | Admitting: Obstetrics and Gynecology

## 2020-03-20 ENCOUNTER — Ambulatory Visit (INDEPENDENT_AMBULATORY_CARE_PROVIDER_SITE_OTHER): Payer: BC Managed Care – PPO | Admitting: Obstetrics and Gynecology

## 2020-03-20 DIAGNOSIS — F419 Anxiety disorder, unspecified: Secondary | ICD-10-CM

## 2020-03-20 DIAGNOSIS — F53 Postpartum depression: Secondary | ICD-10-CM

## 2020-03-20 DIAGNOSIS — O99345 Other mental disorders complicating the puerperium: Secondary | ICD-10-CM

## 2020-03-20 DIAGNOSIS — Z30011 Encounter for initial prescription of contraceptive pills: Secondary | ICD-10-CM

## 2020-03-20 DIAGNOSIS — F32A Depression, unspecified: Secondary | ICD-10-CM

## 2020-03-20 MED ORDER — NORETHINDRONE 0.35 MG PO TABS: 1.0000 | ORAL_TABLET | Freq: Every day | ORAL | 4 refills | Status: DC

## 2020-03-20 NOTE — Progress Notes (Signed)
Postpartum Visit  Chief Complaint:  Chief Complaint  Patient presents with  . Postpartum Care    History of Present Illness: Patient is a 35 y.o. [id number] presents for postpartum visit.  Date of delivery: 02/04/2020 Type of delivery: C-Section under general anesthesia Episiotomy No.  Laceration: no Pregnancy or labor problems:  Abnormal MRI of head, unplanned C-Section, severe anxiety and depression Any problems since the delivery:  Constipation   Newborn Details:  SINGLETON :  1. Baby's name: Sherrie Mustache. Birth weight: 7.14lb Maternal Details:  Breast Feeding: bottle Post partum depression/anxiety noted:  Both depression and anxiety, on medication  Edinburgh Post-Partum Depression Score:  17  She states that she notes not much difference with both medications.  She is going to be enrolling in a home Zulresso study at the end of January.    Date of last PAP: 05/11/2019 - normal, HPV neg  Past Medical History:  Diagnosis Date  . Allergic eczema   . Anxiety   . Depression   . Dysmenorrhea   . Family history of breast cancer    4/21 Cancer genetic testing letter sent  . Iron deficiency anemia 10/03/2019  . Iron deficiency anemia due to chronic blood loss   . Menorrhagia   . Midline low back pain with left-sided sciatica   . Mild asthma    seasonal, not used recently  . Overweight (BMI 25.0-29.9)   . Right foot pain   . Seasonal allergic rhinitis     Past Surgical History:  Procedure Laterality Date  . CESAREAN SECTION  02/04/2020   Procedure: CESAREAN SECTION;  Surgeon: Conard Novak, MD;  Location: [facility] ORS;  Service: Obstetrics;;    Prior to Admission medications   Medication Sig Start Date End Date Taking? Authorizing Provider  buPROPion (WELLBUTRIN XL) 150 MG 24 hr tablet Take 1 tablet (150 mg total) by mouth daily. 02/27/20  Yes Conard Novak, MD  escitalopram (LEXAPRO) 20 MG tablet Take 1 tablet (20 mg total) by mouth daily. 02/07/20  Yes Nadara Mustard, MD  omeprazole (PRILOSEC) 40 MG capsule Take 1 capsule (40 mg total) by mouth daily. 03/01/20 08/28/20 Yes Wyline Mood, MD    No Known Allergies   Social History   Socioeconomic History  . Marital status: Married    Spouse name: Para March  . Number of children: 1  . Years of education: Not on file  . Highest education level: Bachelor's degree (e.g., BA, AB, BS)  Occupational History  . Occupation: Runner, broadcasting/film/video  Tobacco Use  . Smoking status: Never Smoker  . Smokeless tobacco: Never Used  Vaping Use  . Vaping Use: Never used  Substance and Sexual Activity  . Alcohol use: Not Currently    Alcohol/week: 0.0 standard drinks  . Drug use: No  . Sexual activity: Yes    Partners: Male    Birth control/protection: None  Other Topics Concern  . Not on file  Social History Narrative   Married, first son was born 11/13/2016      Patient is getting her Master's this summer.      Lives in [place] w/ husband.  Exercises 1-2 days/wk.   Social Determinants of Health   Financial Resource Strain: Not on file  Food Insecurity: Not on file  Transportation Needs: Not on file  Physical Activity: Not on file  Stress: Not on file  Social Connections: Not on file  Intimate Partner Violence: Not on file    Family History  Problem Relation Age of  Onset  . Bipolar disorder Mother   . Breast cancer Maternal Aunt 40  . Breast cancer Maternal Aunt 50  . Pancreatic cancer Maternal Aunt 70  . Ovarian cancer Maternal Great-grandmother     Review of Systems  Constitutional: Negative.   HENT: Negative.   Eyes: Negative.   Respiratory: Negative.   Cardiovascular: Negative.   Gastrointestinal: Negative.   Genitourinary: Negative.   Musculoskeletal: Negative.   Skin: Negative.   Neurological: Negative.   Psychiatric/Behavioral: Negative.      Physical Exam BP 122/74   Ht 5\' 10"  (1.778 m)   Wt 206 lb (93.4 kg)   LMP 03/19/2020   BMI 29.56 kg/m   Physical Exam Constitutional:       General: She is not in acute distress.    Appearance: Normal appearance. She is well-developed.  HENT:     Head: Normocephalic and atraumatic.  Eyes:     General: No scleral icterus.    Conjunctiva/sclera: Conjunctivae normal.  Cardiovascular:     Rate and Rhythm: Normal rate and regular rhythm.     Heart sounds: No murmur heard. No friction rub. No gallop.   Pulmonary:     Effort: Pulmonary effort is normal. No respiratory distress.     Breath sounds: Normal breath sounds. No wheezing or rales.  Abdominal:     General: Bowel sounds are normal. There is no distension.     Palpations: Abdomen is soft. There is no mass.     Tenderness: There is no abdominal tenderness. There is no guarding or rebound.     Comments: without erythema, induration, warmth, and tenderness. It is clean, dry, and intact.    Musculoskeletal:        General: Normal range of motion.     Cervical back: Normal range of motion and neck supple.  Neurological:     General: No focal deficit present.     Mental Status: She is alert and oriented to person, place, and time.     Cranial Nerves: No cranial nerve deficit.  Skin:    General: Skin is warm and dry.     Findings: No erythema.  Psychiatric:        Mood and Affect: Mood normal.        Behavior: Behavior normal.        Judgment: Judgment normal.      Female Chaperone present during breast and/or pelvic exam.  Assessment: 35 y.o. 36 presenting for 6 week postpartum visit  Plan: Problem List Items Addressed This Visit      Other   Anxiety and depression    Other Visit Diagnoses    Postpartum care and examination    -  Primary   Relevant Medications   norethindrone (MICRONOR) 0.35 MG tablet   Postpartum depression       Encounter for initial prescription of contraceptive pills       Relevant Medications   norethindrone (MICRONOR) 0.35 MG tablet       1) Contraception Education given regarding options for contraception, including oral  contraceptives. Plans for IUD after next period  2)  Pap - ASCCP guidelines and rational discussed.  Patient opts for routine screening interval  3) Patient underwent screening for postpartum depression with some concerns noted. She has enrolled in a study and will likely receive outpatient Zulresso.     4) follow up for IUD placement with next menses.   [id number], MD 03/20/2020 3:17 PM

## 2020-03-22 ENCOUNTER — Inpatient Hospital Stay: Payer: BC Managed Care – PPO | Attending: Oncology

## 2020-03-26 ENCOUNTER — Telehealth: Payer: Self-pay | Admitting: *Deleted

## 2020-03-26 NOTE — Telephone Encounter (Signed)
Pt called to cx her 03/27/20 MyChart Visit and her 03/28/20 Venofer appts.Pt stated that she  doesn't feel like she no longer needs to be seen and that she would contact the office to R/S if needed.Anna KitchenMarland Whitehead

## 2020-03-26 NOTE — Telephone Encounter (Signed)
Thanks for the update

## 2020-03-27 ENCOUNTER — Inpatient Hospital Stay: Payer: BC Managed Care – PPO | Admitting: Oncology

## 2020-03-28 ENCOUNTER — Inpatient Hospital Stay: Payer: BC Managed Care – PPO

## 2020-03-29 ENCOUNTER — Ambulatory Visit: Payer: BC Managed Care – PPO

## 2020-03-30 ENCOUNTER — Other Ambulatory Visit: Payer: Self-pay | Admitting: Obstetrics and Gynecology

## 2020-03-30 DIAGNOSIS — F53 Postpartum depression: Secondary | ICD-10-CM

## 2020-03-30 DIAGNOSIS — O99345 Other mental disorders complicating the puerperium: Secondary | ICD-10-CM

## 2020-04-13 ENCOUNTER — Other Ambulatory Visit: Payer: Self-pay

## 2020-04-13 ENCOUNTER — Ambulatory Visit
Admission: RE | Admit: 2020-04-13 | Discharge: 2020-04-13 | Disposition: A | Discharge status: Home / Self Care | Payer: BC Managed Care – PPO | Source: Ambulatory Visit | Attending: Gastroenterology | Admitting: Gastroenterology

## 2020-04-13 ENCOUNTER — Ambulatory Visit (INDEPENDENT_AMBULATORY_CARE_PROVIDER_SITE_OTHER): Payer: BC Managed Care – PPO | Admitting: Pulmonary Disease

## 2020-04-13 ENCOUNTER — Other Ambulatory Visit: Payer: BC Managed Care – PPO

## 2020-04-13 ENCOUNTER — Encounter: Payer: Self-pay | Admitting: Pulmonary Disease

## 2020-04-13 VITALS — BP 110/70 | HR 69 | Temp 97.1°F | Ht 70.0 in | Wt 214.0 lb

## 2020-04-13 DIAGNOSIS — I9789 Other postprocedural complications and disorders of the circulatory system, not elsewhere classified: Secondary | ICD-10-CM

## 2020-04-13 DIAGNOSIS — Z9189 Other specified personal risk factors, not elsewhere classified: Secondary | ICD-10-CM

## 2020-04-13 DIAGNOSIS — T17208A Unspecified foreign body in pharynx causing other injury, initial encounter: Secondary | ICD-10-CM | POA: Diagnosis present

## 2020-04-13 IMAGING — CT CT NECK W/ CM
3 of 4 series · 12 of 33 positions shown, 14 images · IV contrast (omnipaque)
Comparison: Esophagram [DATE]

CLINICAL DATA: Oropharyngeal aspiration. Dysphagia. Coughing after
eating.

EXAM:
CT NECK WITH CONTRAST
TECHNIQUE: Multidetector CT imaging of the neck was performed using the
standard protocol following the bolus administration of intravenous
contrast.
CONTRAST:  75mL OMNIPAQUE IOHEXOL 300 MG/ML  SOLN

[Series 2: axial neck · axial · 0.45mm/px · z∈[-288,-128]mm · 4 of 122 slices shown, 5 images]
[im 21/122  soft-tissue]
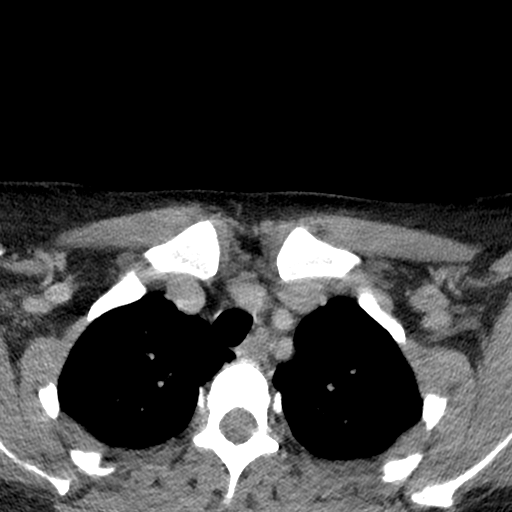
[im 21/122  bone]
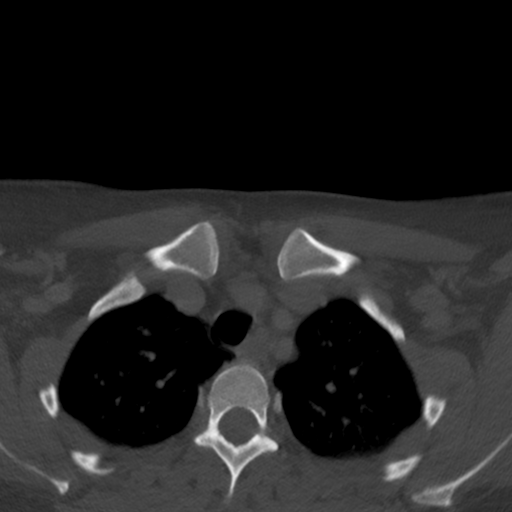
[im 41/122  bone]
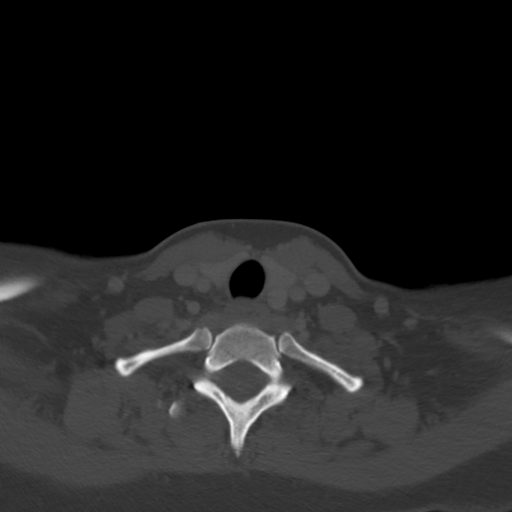
[im 81/122  bone]
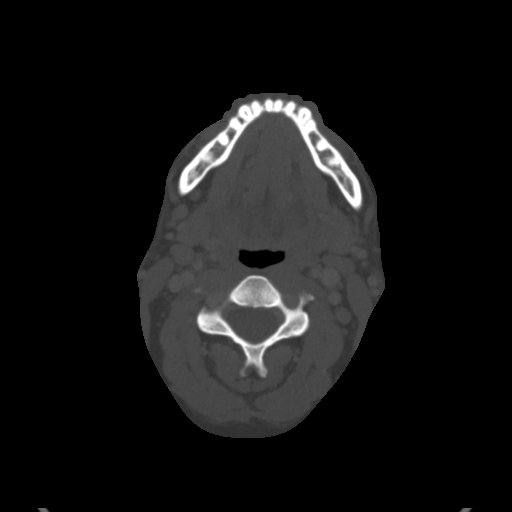
[im 101/122  bone]
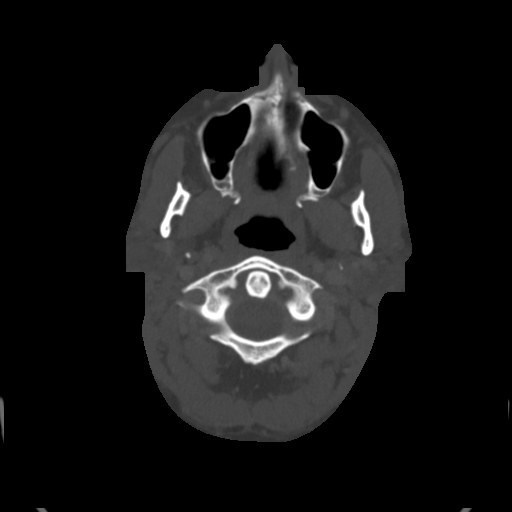

[Series 5: sag neck · sagittal · 0.41mm/px · 5 of 70 slices shown, 6 images]
[im 24/70  bone]
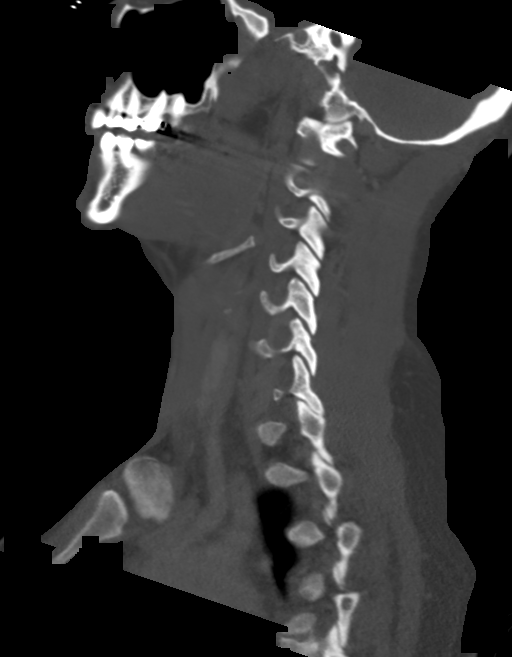
[im 29/70  bone]
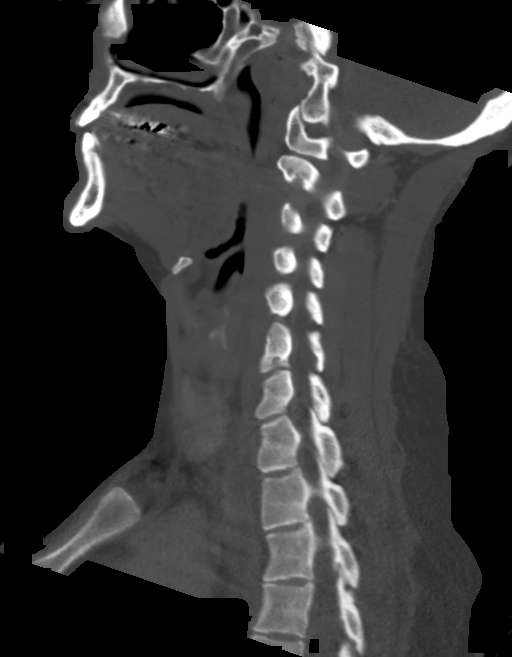
[im 35/70  soft-tissue]
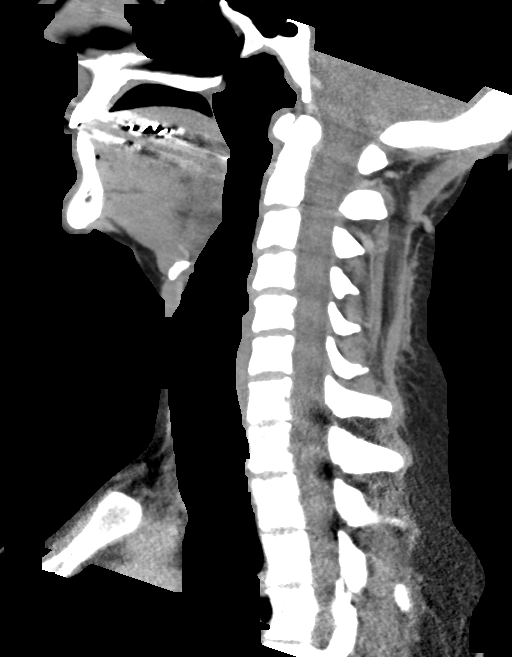
[im 35/70  bone]
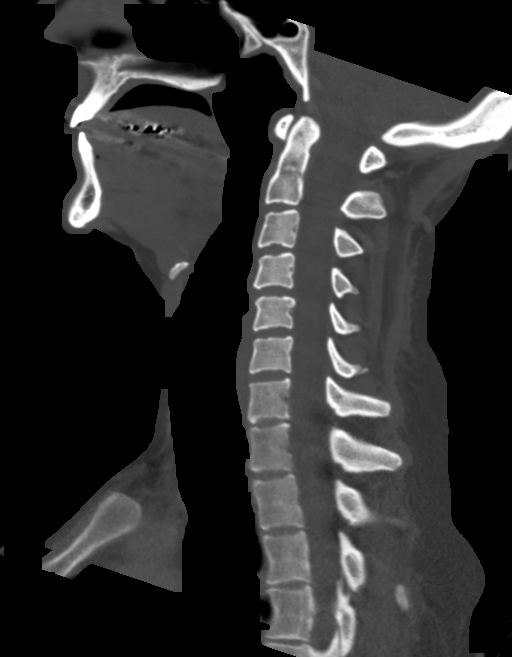
[im 41/70  bone]
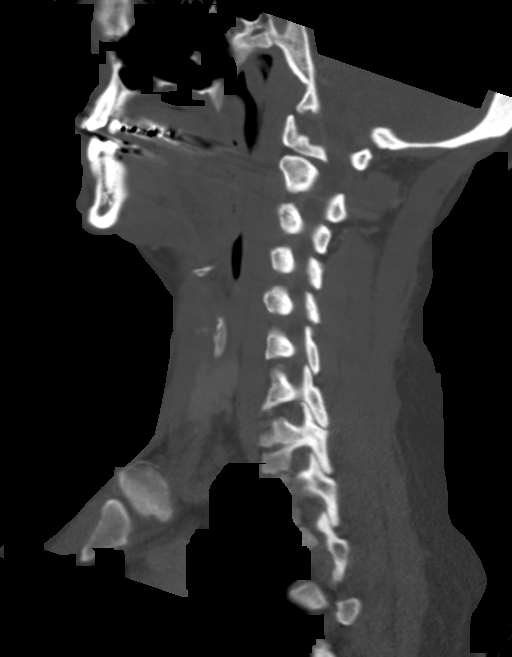
[im 47/70  bone]
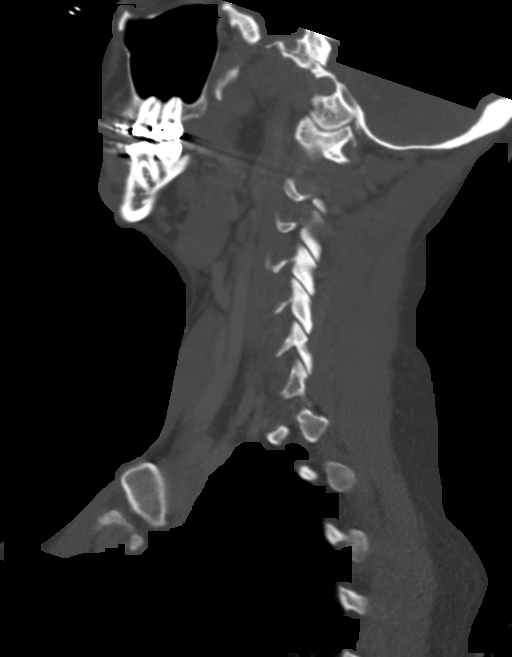

[Series 6: cor neck · coronal · 0.27mm/px · 3 of 105 slices shown]
[im 30/105  bone]
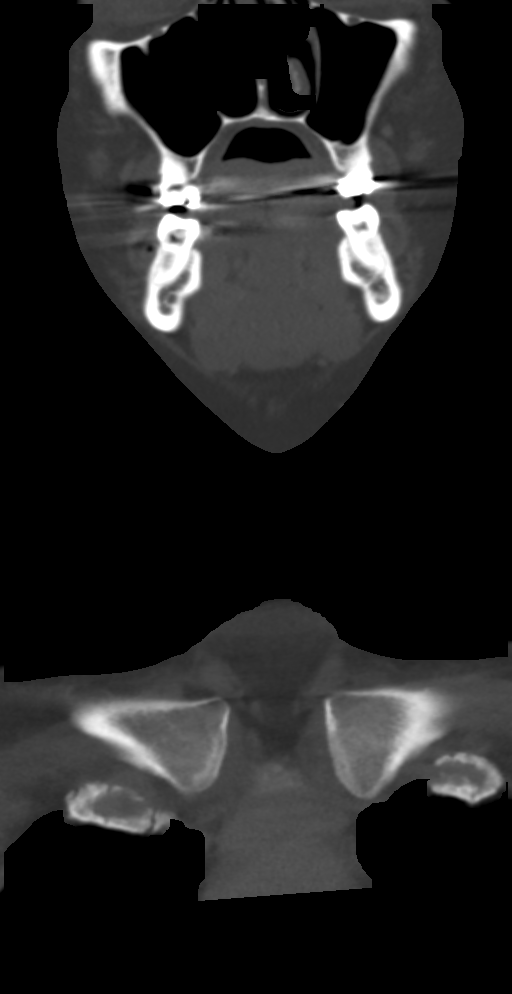
[im 45/105  bone]
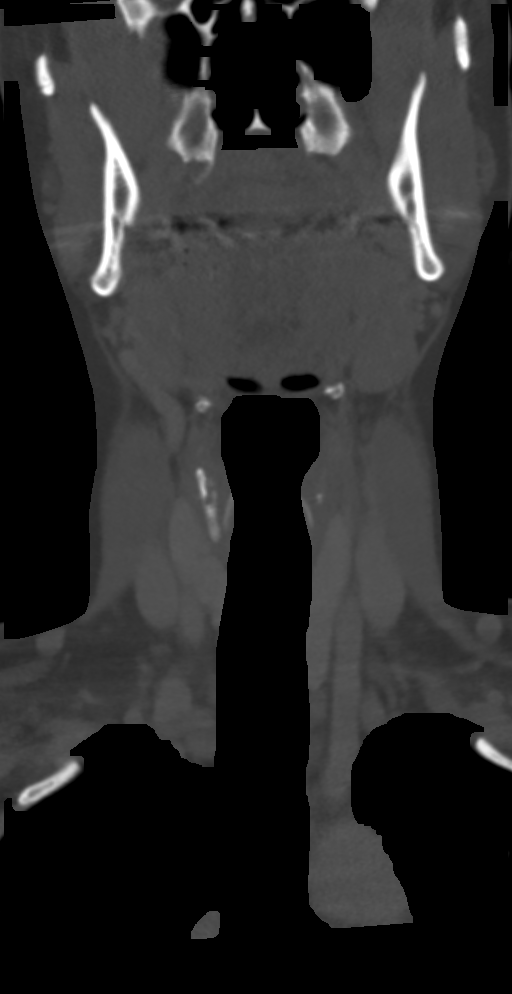
[im 60/105  bone]
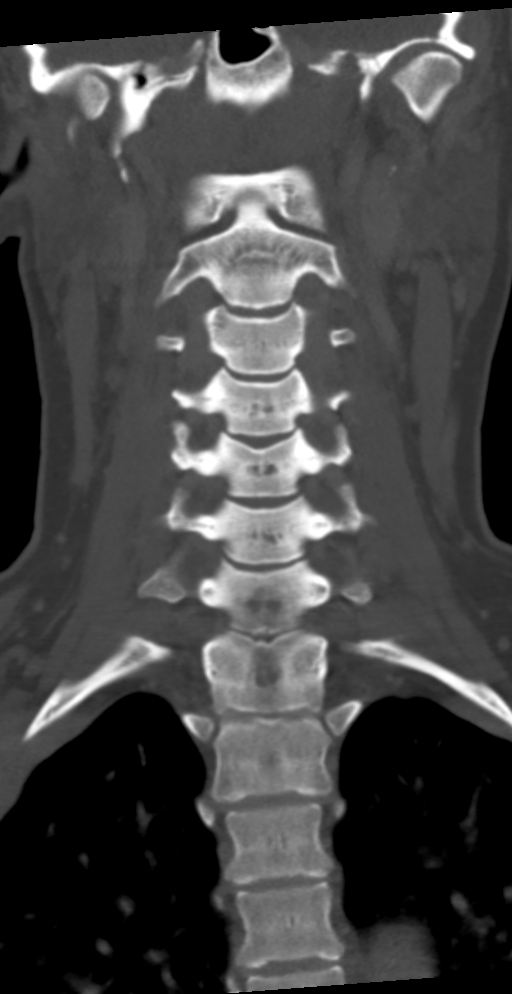

[12 of 33 positions shown; findings below may reference images not displayed]

FINDINGS: Pharynx and larynx: No evidence of mass or swelling. No fluid
collection or inflammatory changes in the parapharyngeal or
retropharyngeal spaces. Widely patent airway.

Salivary glands: No inflammation, mass, or stone.

Thyroid: Unremarkable.

Lymph nodes: No enlarged or suspicious lymph nodes in the neck.

Vascular: Major vascular structures of the neck appear patent.

Limited intracranial: Unremarkable.

Visualized orbits: Unremarkable.

Mastoids and visualized paranasal sinuses: Clear.

Skeleton: No acute osseous abnormality or suspicious osseous lesion.

Upper chest: Clear lung apices.

Other: None.
IMPRESSION: Negative neck CT.

## 2020-04-13 MED ORDER — IOHEXOL 300 MG/ML  SOLN
75.0000 mL | Freq: Once | INTRAMUSCULAR | Status: AC | PRN
  Administered 2020-04-13: 75 mL via INTRAVENOUS

## 2020-04-13 NOTE — Assessment & Plan Note (Signed)
Patient being evaluated by cardiology Patient with nocturnal bradycardia  Plan: Split-night sleep study ordered

## 2020-04-13 NOTE — Progress Notes (Signed)
@Patient  ID: , female    DOB: 1985-11-12, 35 y.o.   MRN: [id number]  Chief Complaint  Patient presents with  . sleep consult    Per [id number], NP--no prior sleep study. C/o restless sleep and daytime sleepiness.     Referring provider: Nicolasa Ducking*  HPI:  35 year old female never smoker referred to our office on 04/13/2020 for sleep consult  PMH: Bradycardia, B12 deficiency, anxiety, depression Smoker/ Smoking History: Never smoker Maintenance: None Pt of: Needs outpatient pulmonary provider  04/13/2020  - Visit   35 year old female never smoker referred to our office today for sleep consult.  Patient was referred by cardiology.  Patient was last seen by cardiology on 03/01/2020.  Assessment and plan from that office visit is listed below:  1.  Presyncope/vasovagal response: Patient with multiple episodes of presyncope during pregnancy followed by a recurrent episode in the setting of sinus bradycardia following C-section and specifically occurring during her first postoperative bowel movement.  During hospitalization, heart rates were in the 40s with normal chronotropic response with ambulation.  Lab work during hospitalization unremarkable.  Echo showed normal LV function without any significant valvular abnormalities.  Follow-up outpatient monitoring notable for an average heart rate 50 bpm with triggered events described as anxiety, lightheadedness, dizziness, and blurred vision being associated with sinus rhythm, sinus tachycardia, sinus arrhythmia, and PVC.  She was noted to have sinus bradycardia down to 28 bpm at approximately 5:30 in the morning and will be referred for sleep evaluation.  Reassurance offered.  We discussed high vagal tone and prophylactic measures to be taken including adequate hydration, salt liberalization, potentially compression stockings, and avoidance of bearing down/straining.  2.  Nocturnal bradycardia: Rate down to  28 during hours of sleep.  No evidence of high-grade heart block.  Referring sleep study.  3.  Disposition: Referring for sleep study.  Follow-up in 6 months or sooner if necessary.  03/03/2020, NP 03/01/2020, 1:05 PM  Patient reports that overall she is doing well.  She is currently having poor quality sleep but she has a newborn at home.  She attributes her fatigue to this.  Epworth score today is 4.  See sleep ROS listed below:  SLEEP ROS   Epworth score today: 4  Do you feel you have non-refreshing sleep?: no  Daytime sleepiness?: no Witnessed apneas?: no  Loud snoring?: no  Choking or gasping episodes that wake pt up from sleep?: no  Does patient experienced sleepiness passenger in a car, lying down to rest in the afternoons, sitting and reading, watching TV or other social situations?: no  Bedtime is typically around: 930pm  Sleep latency?: Which position does the patient sleep in: Nocturnal awakenings?: Nightmares?: no  Sleep talking?: yes Restless Legs?: yes When this patient out of bed in the morning?: 6am When wake up to you feel tired, treated any dryness in the mouth, morning headaches?: yes to both     Questionaires / Pulmonary Flowsheets:   Epworth:  Results of the Epworth flowsheet 04/13/2020  Sitting and reading 0  Watching TV 1  Sitting, inactive in a public place (e.g. a theatre or a meeting) 0  As a passenger in a car for an hour without a break 1  Lying down to rest in the afternoon when circumstances permit 2  Sitting and talking to someone 0  Sitting quietly after a lunch without alcohol 0  In a car, while stopped for a few minutes in traffic 0  Total score 4    Tests:   FENO:  No results found for: NITRICOXIDE  PFT: No flowsheet data found.  WALK:  No flowsheet data found.  Imaging: CT SOFT TISSUE NECK W CONTRAST  Result Date: 04/13/2020 CLINICAL DATA:  Oropharyngeal aspiration. Dysphagia. Coughing after eating. EXAM: CT NECK  WITH CONTRAST TECHNIQUE: Multidetector CT imaging of the neck was performed using the standard protocol following the bolus administration of intravenous contrast. CONTRAST:  12mL OMNIPAQUE IOHEXOL 300 MG/ML  SOLN COMPARISON:  Esophagram 03/07/2020 FINDINGS: Pharynx and larynx: No evidence of mass or swelling. No fluid collection or inflammatory changes in the parapharyngeal or retropharyngeal spaces. Widely patent airway. Salivary glands: No inflammation, mass, or stone. Thyroid: Unremarkable. Lymph nodes: No enlarged or suspicious lymph nodes in the neck. Vascular: Major vascular structures of the neck appear patent. Limited intracranial: Unremarkable. Visualized orbits: Unremarkable. Mastoids and visualized paranasal sinuses: Clear. Skeleton: No acute osseous abnormality or suspicious osseous lesion. Upper chest: Clear lung apices. Other: None. IMPRESSION: Negative neck CT. Electronically Signed   By: Sebastian Ache M.D.   On: 04/13/2020 09:42    Lab Results:  CBC    Component Value Date/Time   WBC 8.7 02/07/2020 0526   RBC 3.45 (L) 02/07/2020 0526   HGB 10.6 (L) 02/07/2020 0526   HGB 12.1 11/25/2019 1611   HCT 33.1 (L) 02/07/2020 0526   HCT 37.2 11/25/2019 1611   PLT 155 02/07/2020 0526   PLT 248 11/25/2019 1611   MCV 95.9 02/07/2020 0526   MCV 92 11/25/2019 1611   MCH 30.7 02/07/2020 0526   MCHC 32.0 02/07/2020 0526   RDW 19.2 (H) 02/07/2020 0526   RDW 20.9 (H) 11/25/2019 1611   LYMPHSABS 1.5 01/16/2020 1406   LYMPHSABS 1.4 11/25/2019 1611   MONOABS 0.4 01/16/2020 1406   EOSABS 0.2 01/16/2020 1406   EOSABS 0.3 11/25/2019 1611   BASOSABS 0.0 01/16/2020 1406   BASOSABS 0.0 11/25/2019 1611    BMET    Component Value Date/Time   NA 136 02/07/2020 0526   NA 139 09/13/2019 1631   K 4.3 02/07/2020 0526   CL 103 02/07/2020 0526   CO2 27 02/07/2020 0526   GLUCOSE 79 02/07/2020 0526   BUN 6 02/07/2020 0526   BUN 7 09/13/2019 1631   CREATININE 0.80 02/07/2020 0526   CREATININE 0.73  04/13/2019 1638   CALCIUM 8.1 (L) 02/07/2020 0526   GFRNONAA >60 02/07/2020 0526   GFRNONAA 110 04/13/2019 1638   GFRAA 129 09/13/2019 1631   GFRAA 127 04/13/2019 1638    BNP No results found for: BNP  ProBNP No results found for: PROBNP  Specialty Problems      Pulmonary Problems   Asthma, mild      No Known Allergies  Immunization History  Administered Date(s) Administered  . Hepatitis A, Adult 12/21/2013  . Influenza, Seasonal, Injecte, Preservative Fre 01/08/2011  . Influenza,inj,Quad PF,6+ Mos 02/16/2019, 11/25/2019  . Influenza-Unspecified 11/15/2013  . PFIZER(Purple Top)SARS-COV-2 Vaccination 05/14/2019, 06/11/2019  . Tdap 12/21/2013, 12/05/2019  . Varicella 12/21/2013    Past Medical History:  Diagnosis Date  . Allergic eczema   . Anxiety   . Depression   . Dysmenorrhea   . Family history of breast cancer    4/21 Cancer genetic testing letter sent  . Iron deficiency anemia 10/03/2019  . Iron deficiency anemia due to chronic blood loss   . Menorrhagia   . Midline low back pain with left-sided sciatica   . Mild asthma  seasonal, not used recently  . Overweight (BMI 25.0-29.9)   . Right foot pain   . Seasonal allergic rhinitis     Tobacco History: Social History   Tobacco Use  Smoking Status Never Smoker  Smokeless Tobacco Never Used   Counseling given: Not Answered   Continue to not smoke  Outpatient Encounter Medications as of 04/13/2020  Medication Sig  . buPROPion (WELLBUTRIN XL) 150 MG 24 hr tablet TAKE 1 TABLET BY MOUTH DAILY  . escitalopram (LEXAPRO) 20 MG tablet Take 1 tablet (20 mg total) by mouth daily.  . norethindrone (MICRONOR) 0.35 MG tablet Take 1 tablet (0.35 mg total) by mouth daily.  Marland Kitchen omeprazole (PRILOSEC) 40 MG capsule Take 1 capsule (40 mg total) by mouth daily.   No facility-administered encounter medications on file as of 04/13/2020.     Review of Systems  Review of Systems  Constitutional: Negative for activity  change, fatigue and fever.  HENT: Negative for sinus pressure, sinus pain and sore throat.   Respiratory: Negative for cough, shortness of breath and wheezing.   Cardiovascular: Negative for chest pain and palpitations.  Gastrointestinal: Negative for diarrhea, nausea and vomiting.  Musculoskeletal: Negative for arthralgias.  Neurological: Negative for dizziness.  Psychiatric/Behavioral: Negative for sleep disturbance. The patient is not nervous/anxious.      Physical Exam  BP 110/70 (BP Location: Left Arm, Cuff Size: Normal)   Pulse 69   Temp (!) 97.1 F (36.2 C) (Temporal)   Ht 5\' 10"  (1.778 m)   Wt 214 lb (97.1 kg)   LMP 04/05/2020 (Approximate)   SpO2 97%   BMI 30.71 kg/m   Wt Readings from Last 5 Encounters:  04/13/20 214 lb (97.1 kg)  03/20/20 206 lb (93.4 kg)  03/01/20 202 lb 12.8 oz (92 kg)  03/01/20 202 lb (91.6 kg)  02/15/20 197 lb (89.4 kg)    BMI Readings from Last 5 Encounters:  04/13/20 30.71 kg/m  03/20/20 29.56 kg/m  03/01/20 29.10 kg/m  03/01/20 28.98 kg/m  02/15/20 29.95 kg/m     Physical Exam Vitals and nursing note reviewed.  Constitutional:      General: She is not in acute distress.    Appearance: Normal appearance.  HENT:     Head: Normocephalic and atraumatic.     Right Ear: External ear normal.     Left Ear: External ear normal.     Nose: Nose normal. No congestion.     Mouth/Throat:     Mouth: Mucous membranes are moist.     Pharynx: Oropharynx is clear.  Eyes:     Pupils: Pupils are equal, round, and reactive to light.  Cardiovascular:     Rate and Rhythm: Normal rate and regular rhythm.     Pulses: Normal pulses.     Heart sounds: No murmur heard.   Pulmonary:     Effort: Pulmonary effort is normal. No respiratory distress.     Breath sounds: Normal breath sounds. No decreased air movement. No decreased breath sounds, wheezing or rales.  Musculoskeletal:     Cervical back: Normal range of motion.  Skin:    General:  Skin is warm and dry.     Capillary Refill: Capillary refill takes less than 2 seconds.  Neurological:     General: No focal deficit present.     Mental Status: She is alert and oriented to person, place, and time. Mental status is at baseline.     Gait: Gait normal.  Psychiatric:  Mood and Affect: Mood normal.        Behavior: Behavior normal.        Thought Content: Thought content normal.        Judgment: Judgment normal.       Assessment & Plan:   At risk for obstructive sleep apnea Nocturnal bradycardia Epworth score today 4 Potential restless leg syndrome  Plan: We will order split-night sleep study  Bradycardia following surgery Patient being evaluated by cardiology Patient with nocturnal bradycardia  Plan: Split-night sleep study ordered    Return in about 2 months (around 06/11/2020), or if symptoms worsen or fail to improve, for [facility].   Coral Ceo, NP 04/13/2020   This appointment required 25 minutes of patient care (this includes precharting, chart review, review of results, face-to-face care, etc.).

## 2020-04-13 NOTE — Assessment & Plan Note (Signed)
Nocturnal bradycardia Epworth score today 4 Potential restless leg syndrome  Plan: We will order split-night sleep study

## 2020-04-13 NOTE — Progress Notes (Signed)
Reviewed and agree with assessment/plan.   Coralyn Helling, MD [facility] 04/13/2020, 12:10 PM Pager:  [phone]

## 2020-04-13 NOTE — Patient Instructions (Addendum)
You were seen today by Coral Ceo, NP  for:   1. At risk for obstructive sleep apnea  - Split night study; Future  2. Bradycardia following surgery  Continue follow-up with cardiology   We recommend today:  Orders Placed This Encounter  Procedures  . Split night study    Standing Status:   Future    Standing Expiration Date:   10/11/2020    Order Specific Question:   Where should this test be performed:    Answer:   [place]   Orders Placed This Encounter  Procedures  . Split night study   No orders of the defined types were placed in this encounter.   Follow Up:    Return in about 2 months (around 06/11/2020), or if symptoms worsen or fail to improve, for [facility].   Notification of test results are managed in the following manner: If there are  any recommendations or changes to the  plan of care discussed in office today,  we will contact you and let you know what they are. If you do not hear from [place], then your results are normal and you can view them through your  MyChart account , or a letter will be sent to you. Thank you again for trusting [place] with your care  - Thank you, [facility]    It is flu season:   >>> Best ways to protect herself from the flu: Receive the yearly flu vaccine, practice good hand hygiene washing with soap and also using hand sanitizer when available, eat a nutritious meals, get adequate rest, hydrate appropriately       Please contact the office if your symptoms worsen or you have concerns that you are not improving.   Thank you for choosing  [facility] for your healthcare, and for allowing [place] to partner with you on your healthcare journey. I am thankful to be able to provide care to you today.   Elisha Headland FNP-C

## 2020-04-17 ENCOUNTER — Telehealth: Payer: Self-pay

## 2020-04-17 NOTE — Telephone Encounter (Signed)
Please schedule in an open slot.

## 2020-04-17 NOTE — Telephone Encounter (Signed)
New message    Patient wants an early appt due to menstrual l cycle and placement of IUD.   MD next available is 2.21.22

## 2020-04-18 ENCOUNTER — Encounter: Payer: Self-pay | Admitting: Neurology

## 2020-04-18 ENCOUNTER — Telehealth (INDEPENDENT_AMBULATORY_CARE_PROVIDER_SITE_OTHER): Payer: BC Managed Care – PPO | Admitting: Neurology

## 2020-04-18 DIAGNOSIS — R930 Abnormal findings on diagnostic imaging of skull and head, not elsewhere classified: Secondary | ICD-10-CM

## 2020-04-18 DIAGNOSIS — I9789 Other postprocedural complications and disorders of the circulatory system, not elsewhere classified: Secondary | ICD-10-CM | POA: Diagnosis not present

## 2020-04-18 NOTE — Patient Instructions (Signed)
Bradycardia, Adult Bradycardia is a slower-than-normal heartbeat. A normal resting heart rate for an adult ranges from 60 to 100 beats per minute. With bradycardia, the resting heart rate is less than 60 beats per minute. Bradycardia can prevent enough oxygen from reaching certain areas of your body when you are active. It can be serious if it keeps enough oxygen from reaching your brain and other parts of your body. Bradycardia is not a problem for everyone. For some healthy adults, a slow resting heart rate is normal. What are the causes? This condition may be caused by:  A problem with the heart, including: ? A problem with the heart's electrical system, such as a heart block. With a heart block, electrical signals between the chambers of the heart are partially or completely blocked, so they are not able to work as they should. ? A problem with the heart's natural pacemaker (sinus node). ? Heart disease. ? A heart attack. ? Heart damage. ? Lyme disease. ? A heart infection. ? A heart condition that is present at birth (congenital heart defect).  Certain medicines that treat heart conditions.  Certain conditions, such as hypothyroidism and obstructive sleep apnea.  Problems with the balance of chemicals and other substances, like potassium, in the blood.  Trauma.  Radiation therapy.   What increases the risk? You are more likely to develop this condition if you:  Are age 65 or older.  Have high blood pressure (hypertension), high cholesterol (hyperlipidemia), or diabetes.  Drink heavily, use tobacco or nicotine products, or use drugs. What are the signs or symptoms? Symptoms of this condition include:  Light-headedness.  Feeling faint or fainting.  Fatigue and weakness.  Trouble with activity or exercise.  Shortness of breath.  Chest pain (angina).  Drowsiness.  Confusion.  Dizziness. How is this diagnosed? This condition may be diagnosed based on:  Your  symptoms.  Your medical history.  A physical exam. During the exam, your health care provider will listen to your heartbeat and check your pulse. To confirm the diagnosis, your health care provider may order tests, such as:  Blood tests.  An electrocardiogram (ECG). This test records the heart's electrical activity. The test can show how fast your heart is beating and whether the heartbeat is steady.  A test in which you wear a portable device (event recorder or Holter monitor) to record your heart's electrical activity while you go about your day.  Anexercise test. How is this treated? Treatment for this condition depends on the cause of the condition and how severe your symptoms are. Treatment may involve:  Treatment of the underlying condition.  Changing your medicines or how much medicine you take.  Having a small, battery-operated device called a pacemaker implanted under the skin. When bradycardia occurs, this device can be used to increase your heart rate and help your heart beat in a regular rhythm. Follow these instructions at home: Lifestyle  Manage any health conditions that contribute to bradycardia as told by your health care provider.  Follow a heart-healthy diet. A nutrition specialist (dietitian) can help educate you about healthy food options and changes.  Follow an exercise program that is approved by your health care provider.  Maintain a healthy weight.  Try to reduce or manage your stress, such as with yoga or meditation. If you need help reducing stress, ask your health care provider.  Do not use any products that contain nicotine or tobacco, such as cigarettes, e-cigarettes, and chewing tobacco. If you need   help quitting, ask your health care provider.  Do not use illegal drugs.  Limit alcohol intake to no more than 1 drink a day for nonpregnant women and 2 drinks a day for men. Be aware of how much alcohol is in your drink. In the [place] one drink equals  one 12 oz bottle of beer (355 mL), one 5 oz glass of wine (148 mL), or one 1 oz glass of hard liquor (44 mL).   General instructions  Take over-the-counter and prescription medicines only as told by your health care provider.  Keep all follow-up visits as told by your health care provider. This is important. How is this prevented? In some cases, bradycardia may be prevented by:  Treating underlying medical problems.  Stopping behaviors or medicines that can trigger the condition. Contact a health care provider if you:  Feel light-headed or dizzy.  Almost faint.  Feel weak or are easily fatigued during physical activity.  Experience confusion or have memory problems. Get help right away if:  You faint.  You have: ? An irregular heartbeat (palpitations). ? Chest pain. ? Trouble breathing. Summary  Bradycardia is a slower-than-normal heartbeat. With bradycardia, the resting heart rate is less than 60 beats per minute.  Treatment for this condition depends on the cause.  Manage any health conditions that contribute to bradycardia as told by your health care provider.  Do not use any products that contain nicotine or tobacco, such as cigarettes, e-cigarettes, and chewing tobacco, and limit alcohol intake.  Keep all follow-up visits as told by your health care provider. This is important. This information is not intended to replace advice given to you by your health care provider. Make sure you discuss any questions you have with your health care provider. Document Revised: 09/14/2017 Document Reviewed: 08/12/2017 Elsevier Patient Education  2021 [employer]  

## 2020-04-18 NOTE — Progress Notes (Addendum)
SLEEP MED[facility]INE CL[facility]IC    Provider:  Melvyn Novas, MD  Primary Care Physician:  Alba Cory, MD [address] [place] [address]     Referring Provider: Gynecologist -         Chief Complaint according to patient   Patient presents with:    . New Patient (Initial Visit)          Virtual Visit via Video Note  I connected with Anna Whitehead on 04/18/20 at  9:00 AM EST by a video enabled telemedicine application and verified that I am speaking with the correct person using two identifiers.  Location: Patient: at home Provider: at her office, [facility].    I discussed the limitations of evaluation and management by telemedicine and the availability of in person appointments. The patient expressed understanding and agreed to proceed.  History of Present Illness: see below.        INTERVAL HISTORY of 04-18-20: Anna Whitehead is a 35 y.o. year old White or Caucasian female patient seen here in a virtual RV on 04/18/2020 from [place]. Anna Whitehead delivered a healthy baby 2 months ago she is here for a follow-up.  She had a history of preeclampsia prior pregnancy and was last seen in November 17 when she was in the late stages of her second pregnancy.  She was deemed a high risk pregnancy antepartum because of an abnormal vascular lesion on MRI.  An EEG study was performed here on 13 December including hyperventilation and photic stimulation and the patient did not produce any epileptiform discharges, asymmetry of electric activity, rhythmicity of periodicity of abnormal discharges.  She became drowsy towards the end of the recording and to eat electrocardiogram remained in normal sinus rhythm.  It was not entirely normal EEG.  She does reports that she delivered her baby by general anesthesia out of an abundance of caution.   In the hospital her obstetrician as well as the anesthesiologist had multiple times commented on her sinus bradycardia for  which there has been no explanation found.  She is not an athlete, her heart rate trended to towards the forties and at times even the thirties.  She did not experience presyncopal symptoms however a low heart rate like this could cause fainting spells.  There has been no repeat event of the tunnel vision and I do wonder now if that was a relation to a bradycardic spell with presyncope.  I would not deem the patient at very high risk for bleeding during her delivery and should she have any more children I would consider her to be safe to undergo a C-section with an epidural anesthesia.  An epidural anesthesia does not increase her risk of bleeding.  We also addressed that they should do routinely every 2 to 3 years an image study of the brain vessels and since our epic system can only broke out about 12 months from today I would like to see her in another virtual visit with [place] 12 months from now and then we can schedule 12 months after that another MRV MRA and a physical life visit in the office to follow-up on that.   Initial CONSULTATION: 02-01-2020    I have the pleasure of seeing Anna Whitehead today, a right -handed White or Caucasian female with pulsatile tinnitus, several near syncopes with vomiting while driving in pregnancy.  She is gravida 2, para 1- has a  has a past medical history of Allergic  eczema, Anxiety, Depression, Dysmenorrhea, Family history of breast cancer, Iron deficiency anemia (10/03/2019), Iron deficiency anemia due to chronic blood loss, Menorrhagia, Midline low back pain with left-sided sciatica, Mild asthma, Overweight (BMI 25.0-29.9), Right foot pain, and Seasonal allergic rhinitis.     Review of Systems: Out of a complete 14 system review, the patient complains of only the following symptoms, and all other reviewed systems are negative.:   No further spells, no headaches, had her baby ( now 38 month old ) by general anesthesia and OB GYN was concerned about her  bradycardia. She is not an athlete- and hr heart rates approached the 30ties range at times, sinus brady- not on calcium channel blockers nor beta blockers.      Past visit : CD  Have the pleasure of meeting Anna Whitehead today, a 35 year old [redacted] weeks pregnant Caucasian female who had presented to her primary care and ear nose and throat after a tinnitus, pulsatile in the right ear since June- later had several syncope fainting spells , last one 2 weeks ago. No vertigo. Tunnel vision, no sounds- vomiting-this happened 4 times during this pregnancy thus far,  while driving.  ENT had referred to neurosurgeon who then referred for MRI brain the patient had an MRI angio of the head all major intracranial vascular structures were flow-void and therefore normal and preserved unremarkable orbits sinuses and mastoid, she had slightly decreased bone marrow signal which is consistent with a history of anemia.  Normal cranial nerve structures, the MRA finding visualized normal arteries widely patent.  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  Otherwise unremarkable appearance of the brain and negative MRI.  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 MRV was ordered-     Social History   Socioeconomic History  . Marital status: Married    Spouse name: Para March  . Number of children: 1  . Years of education: Not on file  . Highest education level: Bachelor's degree (e.g., BA, AB, BS)  Occupational History  . Occupation: Runner, broadcasting/film/video  Tobacco Use  . Smoking status: Never Smoker  . Smokeless tobacco: Never Used  Vaping Use  . Vaping Use: Never used  Substance and Sexual Activity  . Alcohol use: Not Currently    Alcohol/week: 0.0 standard drinks  . Drug use: No  . Sexual activity: Yes    Partners: Male    Birth control/protection:  None  Other Topics Concern  . Not on file  Social History Narrative   Married, first son was born 11/13/2016      Patient is getting her Master's this summer.      Lives in [place] w/ husband.  Exercises 1-2 days/wk.   Social Determinants of Health   Financial Resource Strain: Not on file  Food Insecurity: Not on file  Transportation Needs: Not on file  Physical Activity: Not on file  Stress: Not on file  Social Connections: Not on file    Family History  Problem Relation Age of Onset  . Bipolar disorder Mother   . Breast cancer Maternal Aunt 40  . Breast cancer Maternal Aunt 50  . Pancreatic cancer Maternal Aunt 70  . Ovarian cancer Maternal Great-grandmother     Past Medical History:  Diagnosis Date  . Allergic eczema   . Anxiety   . Depression   .  Dysmenorrhea   . Family history of breast cancer    4/21 Cancer genetic testing letter sent  . Iron deficiency anemia 10/03/2019  . Iron deficiency anemia due to chronic blood loss   . Menorrhagia   . Midline low back pain with left-sided sciatica   . Mild asthma    seasonal, not used recently  . Overweight (BMI 25.0-29.9)   . Right foot pain   . Seasonal allergic rhinitis     Past Surgical History:  Procedure Laterality Date  . CESAREAN SECTION  02/04/2020   Procedure: CESAREAN SECTION;  Surgeon: Conard Novak, MD;  Location: [facility] ORS;  Service: Obstetrics;;     Current Outpatient Medications on File Prior to Visit  Medication Sig Dispense Refill  . buPROPion (WELLBUTRIN XL) 150 MG 24 hr tablet TAKE 1 TABLET BY MOUTH DAILY 30 tablet 0  . escitalopram (LEXAPRO) 20 MG tablet Take 1 tablet (20 mg total) by mouth daily. 30 tablet 5  . norethindrone (MICRONOR) 0.35 MG tablet Take 1 tablet (0.35 mg total) by mouth daily. 84 tablet 4  . omeprazole (PRILOSEC) 40 MG capsule Take 1 capsule (40 mg total) by mouth daily. 90 capsule 1   No current facility-administered medications on file prior to visit.    OBSERVATION:  Virtual visit.   There were no vitals filed for this visit. There is no height or weight on file to calculate BMI.   Wt Readings from Last 3 Encounters:  04/13/20 214 lb (97.1 kg)  03/20/20 206 lb (93.4 kg)  03/01/20 202 lb 12.8 oz (92 kg)     Ht Readings from Last 3 Encounters:  04/13/20 5\' 10"  (1.778 m)  03/20/20 5\' 10"  (1.778 m)  03/01/20 5\' 10"  (1.778 m)      General: The patient is awake, alert and appears not in acute distress. The patient is well groomed.  1) Anna Whitehead delivered 4 years ago healthy baby without complications by vaginal delivery.  The pulsatile tinnitus however only occurred in this pregnancy and there were no comparable symptoms present in her last pregnancy.  It is possible that the pulsatile tinnitus is related to a small vascular abnormality yet there is no evidence of aneurysm or blood clotting neither of a arteriovenous malformation. This last  pregnancy was associated with hyperemesis gravidarum through 3 trimesters.  Sometimes she sees little stars flickering in front of her eyes.  No headaches were associated with the spells.  Her four spells that culminated in vomiting and presyncopal symptoms were unrelated to rapid head movements or any kind of positional change, there have certainly not benign positional vertigo as no vertigo sensation was there.  The question remains if this could have been seizure-like spells yet the patient did not lose consciousness.  She was able to continue her driving to stop at the side of the road-her visual field was impaired and it was not safe for her to continue but she was able to direct the car and again she never lost consciousness or awareness. EEG was normal.   Assessment and Plan:    Follow Up Instructions:  My Plan is to proceed with:  1) patient should be allowed epidural anesthesia for any future surgical assisted delivery.   She had already been seen by [facility] neurosurgery [facility]/03/20 at  [facility] on 12-21-2019, After MRAngio and brain MRI- he ordered the MRV in 2021- I will see her again in 12 month form now and  We then can schedule  a new MRV and MRA in another 12 month, followed by a live visit.    I would like to thank Alba Cory, MD and Alba Cory, Md [address] [place],  [place] [address] for allowing me to meet with and to take care of this pleasant patient.   Epidural anesthesia is permitted-   However, future deliveries should be c- section for reasons of caution, avoiding high intracranial pressures.     I discussed the assessment and treatment plan with the patient. The patient was provided an opportunity to ask questions and all were answered. The patient agreed with the plan and demonstrated an understanding of the instructions.   The patient was advised to call back or seek an in-person evaluation if the symptoms worsen or if the condition fails to improve as anticipated.  A non-face-to-face encounter.   Electronically signed by: Melvyn Novas, MD 04/18/2020 9:08 AM  [facility] Board certified by The [employer] of Sleep Medicine and Diplomate of the [employer] of Sleep Medicine. Board certified In Neurology through the ABPN, Fellow of the [employer] of Neu[employer]. Medical Director of [facility].

## 2020-04-19 ENCOUNTER — Other Ambulatory Visit: Payer: Self-pay

## 2020-04-19 ENCOUNTER — Telehealth (INDEPENDENT_AMBULATORY_CARE_PROVIDER_SITE_OTHER): Payer: BC Managed Care – PPO | Admitting: Gastroenterology

## 2020-04-19 DIAGNOSIS — K219 Gastro-esophageal reflux disease without esophagitis: Secondary | ICD-10-CM

## 2020-04-19 NOTE — Progress Notes (Signed)
Wyline Mood , MD [address]  [address]  [place], [place] [address]  Main: [phone]  Fax: [phone]   Primary Care Physician: Alba Cory, MD  Virtual Visit via Telephone Note  I connected with patient on 04/19/20 at  3:15 PM EST by telephone and verified that I am speaking with the correct person using two identifiers.   I discussed the limitations, risks, security and privacy concerns of performing an evaluation and management service by telephone and the availability of in person appointments. I also discussed with the patient that there may be a patient responsible charge related to this service. The patient expressed understanding and agreed to proceed.  Location of Patient: Home Location of Provider: Home Persons involved: Patient and provider only   History of Present Illness:   Dysphagia follow up   HPI: Anna Whitehead is a 36 y.o. female   Summary of history :  Initially referred and seen in December 2021 for dysphagia. 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  She complains of coughing while eating.  History of heartburn.  Significantly improved after delivery.  Has tried Pepcid and Prilosec on and off in the past.   Interval history   03/01/2020-04/19/2020  03/07/2020 barium swallow showed mild gastroesophageal reflux otherwise normal 03/13/2020: Modified barium swallow demonstrated appearance of prominent tissue in the superior right pharyngeal wall which appears what I would bolus flow to the left and recommended ENT eval with CT scan of the neck. 04/13/2020: CT scan of the neck normal  She states that she still continues to have difficulty with issues of swallowing.  She says that she has previously been evaluated by an ENT physician but will touch base with them to make sure that the back of the throat has been appropriately evaluated.   Current Outpatient Medications  Medication Sig  Dispense Refill  . buPROPion (WELLBUTRIN XL) 150 MG 24 hr tablet TAKE 1 TABLET BY MOUTH DAILY 30 tablet 0  . escitalopram (LEXAPRO) 20 MG tablet Take 1 tablet (20 mg total) by mouth daily. 30 tablet 5  . norethindrone (MICRONOR) 0.35 MG tablet Take 1 tablet (0.35 mg total) by mouth daily. 84 tablet 4  . omeprazole (PRILOSEC) 40 MG capsule Take 1 capsule (40 mg total) by mouth daily. 90 capsule 1   No current facility-administered medications for this visit.    Allergies as of 04/19/2020  . (No Known Allergies)    Review of Systems:    All systems reviewed and negative except where noted in HPI.   Observations/Objective:  Labs: CMP     Component Value Date/Time   NA 136 02/07/2020 0526   NA 139 09/13/2019 1631   K 4.3 02/07/2020 0526   CL 103 02/07/2020 0526   CO2 27 02/07/2020 0526   GLUCOSE 79 02/07/2020 0526   BUN 6 02/07/2020 0526   BUN 7 09/13/2019 1631   CREATININE 0.80 02/07/2020 0526   CREATININE 0.73 04/13/2019 1638   CALCIUM 8.1 (L) 02/07/2020 0526   PROT 5.3 (L) 02/05/2020 1026   PROT 6.5 09/13/2019 1631   ALBUMIN 2.6 (L) 02/05/2020 1026   ALBUMIN 3.9 09/13/2019 1631   AST 28 02/05/2020 1026   ALT 19 02/05/2020 1026   ALKPHOS 88 02/05/2020 1026   BILITOT 0.6 02/05/2020 1026   BILITOT <0.2 09/13/2019 1631   GFRNONAA >60 02/07/2020 0526   GFRNONAA 110 04/13/2019 1638   GFRAA 129 09/13/2019  1631   GFRAA 127 04/13/2019 1638   Lab Results  Component Value Date   WBC 8.7 02/07/2020   HGB 10.6 (L) 02/07/2020   HCT 33.1 (L) 02/07/2020   MCV 95.9 02/07/2020   PLT 155 02/07/2020    Imaging Studies: CT SOFT TISSUE NECK W CONTRAST  Result Date: 04/13/2020 CLINICAL DATA:  Oropharyngeal aspiration. Dysphagia. Coughing after eating. EXAM: CT NECK WITH CONTRAST TECHNIQUE: Multidetector CT imaging of the neck was performed using the standard protocol following the bolus administration of intravenous contrast. CONTRAST:  39mL OMNIPAQUE IOHEXOL 300 MG/ML  SOLN  COMPARISON:  Esophagram 03/07/2020 FINDINGS: Pharynx and larynx: No evidence of mass or swelling. No fluid collection or inflammatory changes in the parapharyngeal or retropharyngeal spaces. Widely patent airway. Salivary glands: No inflammation, mass, or stone. Thyroid: Unremarkable. Lymph nodes: No enlarged or suspicious lymph nodes in the neck. Vascular: Major vascular structures of the neck appear patent. Limited intracranial: Unremarkable. Visualized orbits: Unremarkable. Mastoids and visualized paranasal sinuses: Clear. Skeleton: No acute osseous abnormality or suspicious osseous lesion. Upper chest: Clear lung apices. Other: None. IMPRESSION: Negative neck CT. Electronically Signed   By: Sebastian Ache M.D.   On: 04/13/2020 09:42    Assessment and Plan:   Anna Whitehead is a 35 y.o. y/o female  here to follow-up for dysphagia.  Her predominant symptoms is coughing while eating.  Does not really have symptoms suggestive of dysphagia.  History of heartburn which is very suggestive of acid reflux which was worse during pregnancy and significantly better after delivery.  Has taken PPIs on and off in the past.  Barium swallow showed no obstruction and only showed features of reflux.  Modified barium swallow was concerning for an abnormality in the pharynx and had recommended ENT evaluation with CT scan of the neck.  CT scan of the neck showed no abnormalities.  Plan  1.    Proceed with EGD to rule out any strictures and eosinophilic esophagitis.  We will also rule out any Schatzki's ring. 2.  The patient has said that she will touch base with ENT to ensure that they have reviewed the note from speech pathologistn and their recommendations.  If the upper endoscopy is negative and no other evaluation shows a clear cause then we will proceed with esophageal manometry to rule out any motility issues 3.  Follow-up in 8 weeks  I have discussed alternative options, risks & benefits,  which include, but  are not limited to, bleeding, infection, perforation,respiratory complication & drug reaction.  The patient agrees with this plan & written consent will be obtained.       I discussed the assessment and treatment plan with the patient. The patient was provided an opportunity to ask questions and all were answered. The patient agreed with the plan and demonstrated an understanding of the instructions.   The patient was advised to call back or seek an in-person evaluation if the symptoms worsen or if the condition fails to improve as anticipated.  I provided 20 minutes of non-face-to-face time during this encounter.  Dr Wyline Mood MD,MRCP [facility]) Gastroenterology/Hepatology Pager: [phone]   Speech recognition software was used to dictate this note.

## 2020-04-24 ENCOUNTER — Telehealth: Payer: Self-pay

## 2020-04-24 NOTE — Telephone Encounter (Signed)
Noted. Mirena available in [place] for this patient. 

## 2020-04-24 NOTE — Telephone Encounter (Signed)
Noted. Mirena available in [place] for this patient.

## 2020-04-24 NOTE — Telephone Encounter (Signed)
Patient is scheduled for 05/03/20 with [facility] in [place] for Mirena placement

## 2020-04-24 NOTE — Telephone Encounter (Signed)
Patient is scheduled for 05/03/20 in [place] with [facility] for PP and Mirena placement

## 2020-05-03 ENCOUNTER — Ambulatory Visit (INDEPENDENT_AMBULATORY_CARE_PROVIDER_SITE_OTHER): Payer: BC Managed Care – PPO | Admitting: Obstetrics and Gynecology

## 2020-05-03 ENCOUNTER — Encounter: Payer: Self-pay | Admitting: Obstetrics and Gynecology

## 2020-05-03 VITALS — BP 127/83 | Ht 70.0 in | Wt 217.0 lb

## 2020-05-03 DIAGNOSIS — F53 Postpartum depression: Secondary | ICD-10-CM

## 2020-05-03 DIAGNOSIS — Z30430 Encounter for insertion of intrauterine contraceptive device: Secondary | ICD-10-CM

## 2020-05-03 DIAGNOSIS — F419 Anxiety disorder, unspecified: Secondary | ICD-10-CM

## 2020-05-03 DIAGNOSIS — F32A Depression, unspecified: Secondary | ICD-10-CM | POA: Diagnosis not present

## 2020-05-03 DIAGNOSIS — O99345 Other mental disorders complicating the puerperium: Secondary | ICD-10-CM | POA: Diagnosis not present

## 2020-05-03 LAB — POCT URINE PREGNANCY: Preg Test, Ur: NEGATIVE

## 2020-05-03 MED ORDER — LEVONORGESTREL 20 MCG/24HR IU IUD: 1.0000 | INTRAUTERINE_SYSTEM | Freq: Once | INTRAUTERINE | 0 refills | Status: AC

## 2020-05-03 MED ORDER — BUPROPION HCL ER (XL) 300 MG PO TB24: 300.0000 mg | ORAL_TABLET | Freq: Every day | ORAL | 1 refills | Status: DC

## 2020-05-03 MED ORDER — ESCITALOPRAM OXALATE 20 MG PO TABS: 20.0000 mg | ORAL_TABLET | Freq: Every day | ORAL | 1 refills | Status: DC

## 2020-05-03 NOTE — Progress Notes (Signed)
Obstetrics & Gynecology Office Visit   Chief Complaint:  Chief Complaint  Patient presents with  . Contraception    IUD insertion - RM 3   History of Present Illness: 35 y.o. G42P2002 female who is postpartum from a c-section on 02/04/2020.  She has had issues with anxiety, depression and since her last visit with me has had a couple of panic attacks.  She is scheduled to go back to work on Monday, 05/07/20.  She has had an infusion treatment through a study at [facility].  She states that this worked and helped. However, she does still require the medication.   She is taking Wellbutrin XL 150 mg daily and Lexapro 20 mg po daily.  She has been taking norethindrone and has been bleeding for about a month.  She is not breastfeeding.  She presents mainly today to have an IUD inserted.  She has had Mirena in the past and bled for a couple of months. She had some pain and did have some smaller cysts.   Her Edinburgh score today remains at 20 (scores zero on self-harm).    Past Medical History:  Diagnosis Date  . Allergic eczema   . Anxiety   . Depression   . Dysmenorrhea   . Family history of breast cancer    4/21 Cancer genetic testing letter sent  . Iron deficiency anemia 10/03/2019  . Iron deficiency anemia due to chronic blood loss   . Menorrhagia   . Midline low back pain with left-sided sciatica   . Mild asthma    seasonal, not used recently  . Overweight (BMI 25.0-29.9)   . Right foot pain   . Seasonal allergic rhinitis     Past Surgical History:  Procedure Laterality Date  . CESAREAN SECTION  02/04/2020   Procedure: CESAREAN SECTION;  Surgeon: Conard Novak, MD;  Location: [facility] ORS;  Service: Obstetrics;;    Gynecologic History: Patient's last menstrual period was 04/05/2020 (approximate).  Obstetric History: [id number]  Family History  Problem Relation Age of Onset  . Bipolar disorder Mother   . Breast cancer Maternal Aunt 40  . Breast cancer Maternal Aunt 50  . Pancreatic  cancer Maternal Aunt 70  . Ovarian cancer Maternal Great-grandmother     Social History   Socioeconomic History  . Marital status: Married    Spouse name: Para March  . Number of children: 1  . Years of education: Not on file  . Highest education level: Bachelor's degree (e.g., BA, AB, BS)  Occupational History  . Occupation: Runner, broadcasting/film/video  Tobacco Use  . Smoking status: Never Smoker  . Smokeless tobacco: Never Used  Vaping Use  . Vaping Use: Never used  Substance and Sexual Activity  . Alcohol use: Not Currently    Alcohol/week: 0.0 standard drinks  . Drug use: No  . Sexual activity: Yes    Partners: Male    Birth control/protection: I.U.D.    Comment: Mirena 05/03/20  Other Topics Concern  . Not on file  Social History Narrative   Married, first son was born 11/13/2016      Patient is getting her Master's this summer.      Lives in [place] w/ husband.  Exercises 1-2 days/wk.   Social Determinants of Health   Financial Resource Strain: Not on file  Food Insecurity: Not on file  Transportation Needs: Not on file  Physical Activity: Not on file  Stress: Not on file  Social Connections: Not on file  Intimate Partner Violence:  Not on file    No Known Allergies  Prior to Admission medications   Medication Sig Start Date End Date Taking? Authorizing Provider  buPROPion (WELLBUTRIN XL) 150 MG 24 hr tablet TAKE 1 TABLET BY MOUTH DAILY 03/30/20  Yes Conard Novak, MD  escitalopram (LEXAPRO) 20 MG tablet Take 1 tablet (20 mg total) by mouth daily. 02/07/20  Yes Nadara Mustard, MD  omeprazole (PRILOSEC) 40 MG capsule Take 1 capsule (40 mg total) by mouth daily. 03/01/20 08/28/20 Yes Wyline Mood, MD  norethindrone (MICRONOR) 0.35 MG tablet Take 1 tablet (0.35 mg total) by mouth daily. Patient not taking: Reported on 05/03/2020 03/20/20   Conard Novak, MD    Review of Systems  Constitutional: Negative.   HENT: Negative.   Eyes: Negative.   Respiratory: Negative.    Cardiovascular: Negative.   Gastrointestinal: Negative.   Genitourinary: Negative.   Musculoskeletal: Negative.   Skin: Negative.   Neurological: Negative.   Psychiatric/Behavioral: Positive for depression. Negative for hallucinations, memory loss, substance abuse and suicidal ideas. The patient is nervous/anxious. The patient does not have insomnia.      Physical Exam BP 127/83   Ht 5\' 10"  (1.778 m)   Wt 217 lb (98.4 kg)   LMP 04/05/2020 (Approximate)   BMI 31.14 kg/m  Patient's last menstrual period was 04/05/2020 (approximate). Physical Exam Constitutional:      General: She is not in acute distress.    Appearance: Normal appearance.  Genitourinary:     Bladder and urethral meatus normal.     Right Labia: No rash, tenderness, lesions or skin changes.    Left Labia: No tenderness, lesions, skin changes or rash.    No inguinal adenopathy present in the right or left side.    Pelvic Tanner Score: 5/5.    No vaginal discharge or tenderness.     No vaginal prolapse present.    No vaginal atrophy present.    No cervical motion tenderness, friability, lesion or polyp.     Uterus is not tender or irregular.  HENT:     Head: Normocephalic and atraumatic.  Eyes:     General: No scleral icterus.    Conjunctiva/sclera: Conjunctivae normal.  Abdominal:     Hernia: There is no hernia in the left inguinal area or right inguinal area.  Lymphadenopathy:     Lower Body: No right inguinal adenopathy. No left inguinal adenopathy.  Neurological:     General: No focal deficit present.     Mental Status: She is alert and oriented to person, place, and time.     Cranial Nerves: No cranial nerve deficit.  Psychiatric:        Mood and Affect: Mood normal.        Behavior: Behavior normal.        Judgment: Judgment normal.    IUD Insertion Procedure Note Patient identified, informed consent performed, consent signed.   Discussed risks of irregular bleeding, cramping, infection,  malpositioning, expulsion or uterine perforation of the IUD (1:1000 placements)  which may require further procedure such as laparoscopy.  IUD while effective at preventing pregnancy do not prevent transmission of sexually transmitted diseases and use of barrier methods for this purpose was discussed. Time out was performed.  Urine pregnancy test negative.  Speculum placed in the vagina.  Cervix visualized.  Cleaned with Betadine x 2.  Grasped anteriorly with a single tooth tenaculum.  Uterus sounded to 8.5 cm. IUD placed per manufacturer's recommendations.  Strings trimmed to  3 cm. Tenaculum was removed, good hemostasis noted.  Patient tolerated procedure well.   Patient was given post-procedure instructions.  She was advised to have backup contraception for one week.  Patient was also asked to check IUD strings periodically and follow up in 4 weeks for IUD check.   Female chaperone present for pelvic and breast  portions of the physical exam  Assessment: 35 y.o. G43P2002 female here for  1. Anxiety and depression   2. Postpartum depression   3. Encounter for IUD insertion      Plan: Problem List Items Addressed This Visit      Other   Anxiety and depression - Primary   Relevant Medications   escitalopram (LEXAPRO) 20 MG tablet   buPROPion (WELLBUTRIN XL) 300 MG 24 hr tablet    Other Visit Diagnoses    Postpartum depression       Relevant Medications   escitalopram (LEXAPRO) 20 MG tablet   buPROPion (WELLBUTRIN XL) 300 MG 24 hr tablet   Encounter for IUD insertion       Relevant Medications   levonorgestrel (MIRENA) 20 MCG/24HR IUD     Increased wellbutrin to 300 mg daily.   IUD insertion today.  String check and mood follow up in 4 weeks.  A total of 21 minutes were spent face-to-face with the patient as well as preparation, review, communication, and documentation during this encounter.    [name], MD 05/03/2020 10:47 AM

## 2020-05-03 NOTE — Addendum Note (Signed)
Addended by: Fortunato Curling R on: 05/03/2020 12:30 PM   Modules accepted: Orders

## 2020-05-07 ENCOUNTER — Telehealth: Payer: Self-pay

## 2020-05-07 NOTE — Telephone Encounter (Signed)
Called patient to schedule an EGD procedure. LVM to call office back.

## 2020-05-08 NOTE — Telephone Encounter (Signed)
Mirena rcvd/charged 05/03/20

## 2020-05-15 ENCOUNTER — Telehealth: Payer: Self-pay

## 2020-05-15 NOTE — Telephone Encounter (Signed)
FMLA/DISABILITY form for [employer] filled out, signature obtained and given to [facility] for processing.

## 2020-06-01 ENCOUNTER — Encounter: Payer: Self-pay | Admitting: Obstetrics and Gynecology

## 2020-06-01 ENCOUNTER — Ambulatory Visit (INDEPENDENT_AMBULATORY_CARE_PROVIDER_SITE_OTHER): Payer: BC Managed Care – PPO | Admitting: Obstetrics and Gynecology

## 2020-06-01 ENCOUNTER — Other Ambulatory Visit: Payer: Self-pay

## 2020-06-01 VITALS — BP 118/74 | Ht 70.0 in | Wt 226.0 lb

## 2020-06-01 DIAGNOSIS — F419 Anxiety disorder, unspecified: Secondary | ICD-10-CM | POA: Diagnosis not present

## 2020-06-01 DIAGNOSIS — F32A Depression, unspecified: Secondary | ICD-10-CM

## 2020-06-01 DIAGNOSIS — Z30431 Encounter for routine checking of intrauterine contraceptive device: Secondary | ICD-10-CM | POA: Diagnosis not present

## 2020-06-01 NOTE — Progress Notes (Signed)
IUD String Check  Subjctive: Anna Whitehead presents for IUD string check.  She had a Mirena placed 4 weeks ago.  Since placement of her IUD she had some vaginal bleeding.  She states that she has had steady vaginal bleeding since her delivery.  She denies cramping or discomfort.  She has had intercourse since placement.  She has not checked the strings.  She denies any fever, chills, nausea, vomiting, or other complaints.    Notes continued anxiety/depression, about the same as before. Denies SI/HI.  Score today is 19 (denies self harm) Objective: BP 118/74   Ht 5\' 10"  (1.778 m)   Wt 226 lb (102.5 kg)   BMI 32.43 kg/m  Physical Exam Constitutional:      General: She is not in acute distress.    Appearance: Normal appearance.  Genitourinary:     Bladder and urethral meatus normal.     No lesions in the vagina.     Right Labia: No rash, tenderness, lesions or skin changes.    Left Labia: No tenderness, lesions, skin changes or rash.    No inguinal adenopathy present in the right or left side.    Pelvic Tanner Score: 5/5.    No vaginal discharge, erythema, bleeding or ulceration.     No vaginal prolapse present.     Right Adnexa: not tender, not full and no mass present.    Left Adnexa: not tender, not full and no mass present.    No cervical motion tenderness, discharge, friability, lesion or polyp.     IUD strings visualized.     Uterus is not enlarged, fixed or tender.     Uterus is anteverted.  HENT:     Head: Normocephalic and atraumatic.  Eyes:     General: No scleral icterus.    Conjunctiva/sclera: Conjunctivae normal.  Lymphadenopathy:     Lower Body: No right inguinal adenopathy. No left inguinal adenopathy.  Neurological:     General: No focal deficit present.     Mental Status: She is alert and oriented to person, place, and time.     Cranial Nerves: No cranial nerve deficit.  Psychiatric:        Mood and Affect: Mood normal.        Behavior:  Behavior normal.        Judgment: Judgment normal.    Female chaperone was present for the entirety of the pelvic exam  Assessment: 35 y.o. year old female status post prior Mirena IUD placement 4 week ago, doing well.  Plan: 1.  The patient was given instructions to check her IUD strings monthly and call with any problems or concerns.  She should call for fevers, chills, abnormal vaginal discharge, pelvic pain, or other complaints. 2.  She will return for a annual exam in 1 year.  All questions answered. 3. Referral to psych for need for intense medication and counseling.   20 minutes spent in face to face discussion with > 50% spent in counseling, management, and coordination of care for her newly-placed IUD.  Risks and benefits of IUD discussed including the risks of irregular bleeding, cramping, infection, malpositioning, expulsion, which may require further procedures such as laparoscopy.  IUDs while effective at preventing pregnancy do not prevent transmission of sexually transmitted diseases and use of barrier methods for this purpose was discussed.  Low overall incidence of failure with 99.7% efficacy rate in typical use.    36, MD 06/01/2020 4:25 PM

## 2020-06-26 ENCOUNTER — Other Ambulatory Visit: Payer: Self-pay | Admitting: Gastroenterology

## 2020-08-13 ENCOUNTER — Other Ambulatory Visit: Payer: Self-pay | Admitting: Obstetrics and Gynecology

## 2020-08-16 ENCOUNTER — Encounter: Payer: Self-pay | Admitting: Oncology

## 2020-08-16 ENCOUNTER — Telehealth: Payer: Self-pay

## 2020-08-16 NOTE — Telephone Encounter (Signed)
FMLA/DISABILITY forms (2) filled out; will obtain signature, fax, and process.

## 2020-08-30 ENCOUNTER — Ambulatory Visit: Payer: BC Managed Care – PPO | Admitting: Cardiovascular Disease

## 2020-09-04 ENCOUNTER — Other Ambulatory Visit: Payer: Self-pay | Admitting: Obstetrics and Gynecology

## 2020-09-04 DIAGNOSIS — F53 Postpartum depression: Secondary | ICD-10-CM

## 2020-09-04 DIAGNOSIS — F32A Depression, unspecified: Secondary | ICD-10-CM

## 2020-09-04 DIAGNOSIS — O99345 Other mental disorders complicating the puerperium: Secondary | ICD-10-CM

## 2020-10-18 ENCOUNTER — Telehealth: Payer: BC Managed Care – PPO

## 2020-10-18 NOTE — Telephone Encounter (Signed)
FMLA/DISABILITY forms for [employer] (2) filled out, signature obtained and faxed.

## 2020-11-27 ENCOUNTER — Telehealth: Payer: Self-pay

## 2020-11-27 NOTE — Telephone Encounter (Signed)
Form for [employer] filled out signature obtained and copy given to pt.

## 2020-12-07 ENCOUNTER — Other Ambulatory Visit: Payer: Self-pay | Admitting: Obstetrics & Gynecology

## 2020-12-07 DIAGNOSIS — F419 Anxiety disorder, unspecified: Secondary | ICD-10-CM

## 2020-12-07 DIAGNOSIS — F32A Depression, unspecified: Secondary | ICD-10-CM

## 2020-12-07 NOTE — Telephone Encounter (Signed)
Per [employer] recall patient isn't due until March. Our scheduled for March 2023 isn't ready yet. Reminder letter will be sent to patient.

## 2020-12-07 NOTE — Telephone Encounter (Signed)
Sch Annual

## 2021-01-01 ENCOUNTER — Encounter: Payer: Self-pay | Admitting: Oncology

## 2021-03-11 ENCOUNTER — Other Ambulatory Visit: Payer: Self-pay | Admitting: Obstetrics & Gynecology

## 2021-03-11 DIAGNOSIS — F419 Anxiety disorder, unspecified: Secondary | ICD-10-CM

## 2021-03-16 ENCOUNTER — Other Ambulatory Visit: Payer: Self-pay | Admitting: Obstetrics & Gynecology

## 2021-03-16 DIAGNOSIS — F419 Anxiety disorder, unspecified: Secondary | ICD-10-CM

## 2021-03-21 ENCOUNTER — Ambulatory Visit
Admission: EM | Admit: 2021-03-21 | Discharge: 2021-03-21 | Disposition: A | Discharge status: Home / Self Care | Payer: No Typology Code available for payment source | Attending: Emergency Medicine | Admitting: Emergency Medicine

## 2021-03-21 ENCOUNTER — Other Ambulatory Visit: Payer: Self-pay

## 2021-03-21 ENCOUNTER — Encounter: Payer: Self-pay | Admitting: Oncology

## 2021-03-21 ENCOUNTER — Encounter: Payer: Self-pay | Admitting: Emergency Medicine

## 2021-03-21 ENCOUNTER — Ambulatory Visit: Payer: Self-pay | Admitting: *Deleted

## 2021-03-21 DIAGNOSIS — J069 Acute upper respiratory infection, unspecified: Secondary | ICD-10-CM | POA: Diagnosis not present

## 2021-03-21 DIAGNOSIS — J029 Acute pharyngitis, unspecified: Secondary | ICD-10-CM | POA: Diagnosis not present

## 2021-03-21 LAB — POCT RAPID STREP A (OFFICE): Rapid Strep A Screen: NEGATIVE

## 2021-03-21 NOTE — ED Triage Notes (Signed)
Pt here with painful, red and swollen throat x 3 days. No other sx are present.

## 2021-03-21 NOTE — Discharge Instructions (Addendum)
Your COVID is pending.  Your strep test is negative.   Take Tylenol or ibuprofen as needed for fever or discomfort.  Rest and keep yourself hydrated.    Follow-up with your primary care provider if your symptoms are not improving.

## 2021-03-21 NOTE — ED Provider Notes (Signed)
[facility]    CSN: [id number] Arrival date & time: 03/21/21  0834      History   Chief Complaint Chief Complaint  Patient presents with   Sore Throat    HPI Anna Whitehead is a 36 y.o. female.  Patient presents with 3-day history of sore throat.  She also reports mild cough.  No fever, rash, wheezing, shortness of breath, vomiting, diarrhea, or other symptoms.  OTC treatment at home Tylenol and Mucinex.  Her medical history includes asthma, seasonal allergies, allergic eczema.  The history is provided by the patient and medical records.   Past Medical History:  Diagnosis Date   Allergic eczema    Anxiety    Depression    Dysmenorrhea    Family history of breast cancer    4/21 Cancer genetic testing letter sent   Iron deficiency anemia 10/03/2019   Iron deficiency anemia due to chronic blood loss    Menorrhagia    Midline low back pain with left-sided sciatica    Mild asthma    seasonal, not used recently   Overweight (BMI 25.0-29.9)    Right foot pain    Seasonal allergic rhinitis     Patient Active Problem List   Diagnosis Date Noted   At risk for obstructive sleep apnea 04/13/2020   Bradycardia following surgery 02/05/2020   Labor and delivery, indication for care 02/04/2020   Abnormal MRI of head 02/04/2020   [redacted] weeks gestation of pregnancy    Anemia during pregnancy in second trimester 10/03/2019   Obesity affecting pregnancy 08/29/2019   Rh negative state in antepartum period 08/29/2019   Family history of breast cancer 07/18/2019   Family history of pancreatic cancer 07/18/2019   Supervision of high risk pregnancy, antepartum 06/16/2019   Hx of preeclampsia, prior pregnancy, currently pregnant 06/16/2019   Depression, major, recurrent, moderate ([place]) 07/18/2016   B12 deficiency 07/18/2016   Vitamin D insufficiency 07/18/2016   History of gestational hypertension 11/14/2015   Asthma, mild 10/13/2014   H/O high risk medication treatment  10/13/2014   Overweight 10/13/2014   Anxiety and depression 10/13/2014    Past Surgical History:  Procedure Laterality Date   CESAREAN SECTION  02/04/2020   Procedure: CESAREAN SECTION;  Surgeon: Will Bonnet, MD;  Location: [facility] ORS;  Service: Obstetrics;;    OB History     Gravida  2   Para  2   Term  2   Preterm      AB      Living  2      SAB      IAB      Ectopic      Multiple  0   Live Births  2            Home Medications    Prior to Admission medications   Medication Sig Start Date End Date Taking? Authorizing Provider  buPROPion (WELLBUTRIN XL) 150 MG 24 hr tablet TAKE 1 TABLET BY MOUTH DAILY 08/20/20   Will Bonnet, MD  buPROPion (WELLBUTRIN XL) 300 MG 24 hr tablet TAKE ONE TABLET BY MOUTH EVERY DAY 09/04/20   Will Bonnet, MD  escitalopram (LEXAPRO) 20 MG tablet TAKE ONE TABLET (20 MG) BY MOUTH EVERY DAY 12/07/20   Gae Dry, MD  levonorgestrel (MIRENA) 20 MCG/24HR IUD 1 Intra Uterine Device (1 each total) by Intrauterine route once for 1 dose. 05/03/20 05/03/20  Will Bonnet, MD  norethindrone (MICRONOR) 0.35 MG  tablet Take 1 tablet (0.35 mg total) by mouth daily. Patient not taking: Reported on 05/03/2020 03/20/20   Will Bonnet, MD  omeprazole (PRILOSEC) 40 MG capsule Take 1 capsule (40 mg total) by mouth daily. 03/01/20 08/28/20  Jonathon Bellows, MD    Family History Family History  Problem Relation Age of Onset   Bipolar disorder Mother    Breast cancer Maternal Aunt 40   Breast cancer Maternal Aunt 50   Pancreatic cancer Maternal Aunt 70   Ovarian cancer Maternal Great-grandmother     Social History Social History   Tobacco Use   Smoking status: Never   Smokeless tobacco: Never  Vaping Use   Vaping Use: Never used  Substance Use Topics   Alcohol use: Not Currently    Alcohol/week: 0.0 standard drinks   Drug use: No     Allergies   Patient has no known allergies.   Review of Systems Review of  Systems  Constitutional:  Negative for chills and fever.  HENT:  Positive for sore throat. Negative for ear pain.   Respiratory:  Positive for cough. Negative for shortness of breath.   Cardiovascular:  Negative for chest pain and palpitations.  Skin:  Negative for color change and rash.  All other systems reviewed and are negative.   Physical Exam Triage Vital Signs ED Triage Vitals  Enc Vitals Group     BP 03/21/21 0848 121/89     Pulse Rate 03/21/21 0848 84     Resp 03/21/21 0848 18     Temp 03/21/21 0848 98.3 F (36.8 C)     Temp Source 03/21/21 0848 Oral     SpO2 03/21/21 0848 98 %     Weight --      Height --      Head Circumference --      Peak Flow --      Pain Score 03/21/21 0852 5     Pain Loc --      Pain Edu? --      Excl. in [place]? --    No data found.  Updated Vital Signs BP 121/89 (BP Location: Left Arm)    Pulse 84    Temp 98.3 F (36.8 C) (Oral)    Resp 18    SpO2 98%   Visual Acuity Right Eye Distance:   Left Eye Distance:   Bilateral Distance:    Right Eye Near:   Left Eye Near:    Bilateral Near:     Physical Exam Vitals and nursing note reviewed.  Constitutional:      General: She is not in acute distress.    Appearance: She is well-developed. She is not ill-appearing.  HENT:     Right Ear: Tympanic membrane normal.     Left Ear: Tympanic membrane normal.     Nose: Nose normal.     Mouth/Throat:     Mouth: Mucous membranes are moist.     Pharynx: Posterior oropharyngeal erythema present. No oropharyngeal exudate.  Cardiovascular:     Rate and Rhythm: Normal rate and regular rhythm.     Heart sounds: Normal heart sounds.  Pulmonary:     Effort: Pulmonary effort is normal. No respiratory distress.     Breath sounds: Normal breath sounds.  Musculoskeletal:     Cervical back: Neck supple.  Skin:    General: Skin is warm and dry.  Neurological:     Mental Status: She is alert.  Psychiatric:  Mood and Affect: Mood normal.         Behavior: Behavior normal.     UC Treatments / Results  Labs (all labs ordered are listed, but only abnormal results are displayed) Labs Reviewed  NOVEL CORONAVIRUS, NAA  POCT RAPID STREP A (OFFICE)    EKG   Radiology No results found.  Procedures Procedures (including critical care time)  Medications Ordered in UC Medications - No data to display  Initial Impression / Assessment and Plan / UC Course  I have reviewed the triage vital signs and the nursing notes.  Pertinent labs & imaging results that were available during my care of the patient were reviewed by me and considered in my medical decision making (see chart for details).   Sore throat, Viral URI.  Rapid strep negative.  COVID pending.  Instructed patient to self quarantine per CDC guidelines.  Discussed symptomatic treatment including Tylenol or ibuprofen, rest, hydration.  Instructed patient to follow up with PCP if symptoms are not improving.  Patient agrees to plan of care.    Final Clinical Impressions(s) / UC Diagnoses   Final diagnoses:  Sore throat  Viral URI     Discharge Instructions      Your COVID is pending.  Your strep test is negative.   Take Tylenol or ibuprofen as needed for fever or discomfort.  Rest and keep yourself hydrated.    Follow-up with your primary care provider if your symptoms are not improving.         ED Prescriptions   None    PDMP not reviewed this encounter.   Sharion Balloon, NP 03/21/21 [phone]

## 2021-03-21 NOTE — Telephone Encounter (Signed)
Summary: sore throat a couple of days   Pt has sore throat for a couple of days.  Has not done a covid test.  Wanted appt to see "what is going on", but nothing available.  I advised pt I would have nurse call her back.        Chief Complaint: sore throat. Patient called and has already been seen at [facility].  Symptoms: sore throat  Frequency: na Pertinent Negatives: Patient denies na Disposition: [] ED /[x] Urgent Care (no appt availability in office) / [] Appointment(In office/virtual)/ []  [facility]/ [] Home Care/ [] Refused Recommended Disposition /[] [facility]/ []  Follow-up with PCP Additional Notes:  Seen in UC tested negative for strep and awaiting covid results. No other sx reported       Reason for Disposition  SEVERE (e.g., excruciating) throat pain  Answer Assessment - Initial Assessment Questions 1. ONSET: "When did the throat start hurting?" (Hours or days ago)      na 2. SEVERITY: "How bad is the sore throat?" (Scale 1-10; mild, moderate or severe)   - MILD (1-3):  doesn't interfere with eating or normal activities   - MODERATE (4-7): interferes with eating some solids and normal activities   - SEVERE (8-10):  excruciating pain, interferes with most normal activities   - SEVERE DYSPHAGIA: can't swallow liquids, drooling     Went to UC for evaluation 3. STREP EXPOSURE: "Has there been any exposure to strep within the past week?" If Yes, ask: "What type of contact occurred?"      Tested for strep and negative 4.  VIRAL SYMPTOMS: "Are there any symptoms of a cold, such as a runny nose, cough, hoarse voice or red eyes?"      Cough and sore throat  5. FEVER: "Do you have a fever?" If Yes, ask: "What is your temperature, how was it measured, and when did it start?"     na 6. PUS ON THE TONSILS: "Is there pus on the tonsils in the back of your throat?"     na 7. OTHER SYMPTOMS: "Do you have any other symptoms?" (e.g., difficulty breathing, headache, rash)      na 8. PREGNANCY: "Is there any chance you are pregnant?" "When was your last menstrual period?"     na  Protocols used: Sore Throat-A-AH

## 2021-03-22 LAB — SARS-COV-2, NAA 2 DAY TAT

## 2021-03-22 LAB — NOVEL CORONAVIRUS, NAA: SARS-CoV-2, NAA: NOT DETECTED

## 2021-03-28 ENCOUNTER — Telehealth: Payer: Self-pay

## 2021-03-28 DIAGNOSIS — F32A Depression, unspecified: Secondary | ICD-10-CM

## 2021-03-28 NOTE — Telephone Encounter (Signed)
Pt calling for refill of antidepressant; has been denied; has been without it for two weeks; what needs to be done?  [phone]

## 2021-03-29 NOTE — Telephone Encounter (Signed)
Called and left voicemail for patient to call back to be scheduled. 

## 2021-04-04 ENCOUNTER — Encounter: Payer: Self-pay | Admitting: Oncology

## 2021-04-04 NOTE — Telephone Encounter (Signed)
LMVM to advise unable to refill prior to appointment. Appointment is at 8:35 am tomorrow.

## 2021-04-04 NOTE — Telephone Encounter (Signed)
Patient scheduled apt for tomorrow. Inquiring if she can get a refill before being seen? DH#[phone]

## 2021-04-05 ENCOUNTER — Encounter: Payer: Self-pay | Admitting: Oncology

## 2021-04-05 ENCOUNTER — Ambulatory Visit: Payer: BC Managed Care – PPO | Admitting: Licensed Practical Nurse

## 2021-04-05 MED ORDER — ESCITALOPRAM OXALATE 20 MG PO TABS: ORAL_TABLET | ORAL | 0 refills | Status: DC

## 2021-04-05 NOTE — Addendum Note (Signed)
Addended by: Loran Senters D on: 04/05/2021 11:15 AM   Modules accepted: Orders

## 2021-04-05 NOTE — Telephone Encounter (Signed)
I was able to send in refill; however, I sent #[phone] to keep pharm from questioning and to keep it less steps for you to send in refills at her visit.

## 2021-04-05 NOTE — Telephone Encounter (Signed)
Pt calling; has a family emergency and had to cancel today's appt; has been out of antidepressant for two weeks now; can she possibly get a refill to get her to her appt?  [phone]

## 2021-04-16 ENCOUNTER — Other Ambulatory Visit: Payer: Self-pay

## 2021-04-16 ENCOUNTER — Ambulatory Visit (INDEPENDENT_AMBULATORY_CARE_PROVIDER_SITE_OTHER): Payer: No Typology Code available for payment source | Admitting: Licensed Practical Nurse

## 2021-04-16 VITALS — BP 122/74 | Ht 70.0 in | Wt 251.0 lb

## 2021-04-16 DIAGNOSIS — Z01419 Encounter for gynecological examination (general) (routine) without abnormal findings: Secondary | ICD-10-CM | POA: Diagnosis not present

## 2021-04-16 DIAGNOSIS — Z862 Personal history of diseases of the blood and blood-forming organs and certain disorders involving the immune mechanism: Secondary | ICD-10-CM

## 2021-04-16 DIAGNOSIS — F32A Depression, unspecified: Secondary | ICD-10-CM

## 2021-04-16 DIAGNOSIS — Z131 Encounter for screening for diabetes mellitus: Secondary | ICD-10-CM | POA: Diagnosis not present

## 2021-04-16 DIAGNOSIS — F53 Postpartum depression: Secondary | ICD-10-CM

## 2021-04-16 DIAGNOSIS — Z13220 Encounter for screening for lipoid disorders: Secondary | ICD-10-CM | POA: Diagnosis not present

## 2021-04-16 DIAGNOSIS — F419 Anxiety disorder, unspecified: Secondary | ICD-10-CM

## 2021-04-16 MED ORDER — ESCITALOPRAM OXALATE 20 MG PO TABS: ORAL_TABLET | ORAL | 4 refills | Status: DC

## 2021-04-16 MED ORDER — BUPROPION HCL ER (XL) 300 MG PO TB24: 300.0000 mg | ORAL_TABLET | Freq: Every day | ORAL | 1 refills | Status: DC

## 2021-04-16 NOTE — Progress Notes (Signed)
Gynecology Annual Exam   PCP: Anna Cory, MD  Chief Complaint:  Chief Complaint  Patient presents with   Annual Exam    History of Present Illness: Patient is a 36 y.o. G2P2002 presents for annual exam. The patient has no complaints today. She is here today because she needs a refill on her medications for anxiety and depression. Has run out of medication, reports symptoms are worse now that she has not taken her meds, was unable to come in sooner for an apt. Denies Si/HI.  Declines Pelvic exam today. Pap not due.  Hx of anemia, desires CBC today   LMP: No LMP recorded. (Menstrual status: IUD). 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 She currently uses IUD for contraception. She denies dyspareunia.  The patient does not perform self breast exams.  There is notable family history of breast or ovarian cancer in her family. She has had testing and was told she is high risk for breast CA and should start screening mammograms at age 14.   The patient wears seatbelts: yes.   The patient has regular exercise: yes.   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  Works as a Water engineer  Lives with Husband and 2 sons, feel safe at home.   The patient reports current symptoms of depression.  -Medication helps, is considering therapy.   Review of Systems: Review of Systems  Constitutional: Negative.   Respiratory: Negative.    Cardiovascular: Negative.   Gastrointestinal: Negative.   Genitourinary: Negative.   Musculoskeletal: Negative.   Neurological:  Positive for dizziness.  Endo/Heme/Allergies: Negative.   Psychiatric/Behavioral:  Positive for depression. The patient is nervous/anxious.     Past Medical History:  Patient Active Problem List   Diagnosis Date Noted   At risk for obstructive sleep apnea 04/13/2020   Bradycardia following surgery 02/05/2020   Labor and delivery, indication for care 02/04/2020   Abnormal MRI of head 02/04/2020   [redacted] weeks gestation of pregnancy    Anemia during pregnancy in second trimester 10/03/2019   Obesity affecting pregnancy 08/29/2019   Rh negative state in antepartum period 08/29/2019    Rhogam 9/10    Family history of breast cancer 07/18/2019    Maternal aunts x 2    Family history of pancreatic cancer 07/18/2019    Maternal aunt    Supervision of high risk pregnancy, antepartum 06/16/2019     Nursing Staff Provider  Office Location  [facility] Dating   LMP = 6 wk [place]  Language  English Anatomy US    Flu Vaccine  10/21 Genetic Screen  NIPS:   AFP:   First Screen:  Declined MaternT  TDaP vaccine   10/21 Early GTT135 Early :9/10 Third trimester :   Rhogam  28 weeks   LAB RESULTS   Feeding Plan Breast Blood Type O/Negative/-- (04/05 1019)   Contraception IUD Antibody Negative (04/05 1019)  Circumcision  Rubella 1.89 (04/05 1019)  Pediatrician   RPR Non Reactive (04/05 1019)   Support Person Anna Whitehead FOB HBsAg Negative (04/05 1019)   Prenatal Classes  HIV Non Reactive (04/05 1019)    Varicella  Immune  BTL Consent  GBS  (For PCN  allergy, check sensitivities)        VBAC Consent n/a Pap      Hgb Electro      CF      SMA            Hx of preeclampsia, prior pregnancy, currently pregnant 06/16/2019   Depression, major, recurrent, moderate (HCC) 07/18/2016   B12 deficiency 07/18/2016   Vitamin D insufficiency 07/18/2016   History of gestational hypertension 11/14/2015   Asthma, mild 10/13/2014   H/O high risk medication treatment 10/13/2014   Overweight 10/13/2014   Anxiety and depression 10/13/2014    Past Surgical History:  Past Surgical History:  Procedure Laterality Date   CESAREAN SECTION  02/04/2020    Procedure: CESAREAN SECTION;  Surgeon: Anna Novak, MD;  Location: [facility] ORS;  Service: Obstetrics;;    Gynecologic History:  No LMP recorded. (Menstrual status: IUD). Contraception: IUD Last Pap: Results were: no abnormalities 05/11/2019   Obstetric History: G2P2002  Family History:  Family History  Problem Relation Age of Onset   Bipolar disorder Mother    Breast cancer Maternal Aunt 32   Breast cancer Maternal Aunt 50   Pancreatic cancer Maternal Aunt 37   Ovarian cancer Maternal Great-grandmother     Social History:  Social History   Socioeconomic History   Marital status: Married    Spouse name: Anna Whitehead   Number of children: 1   Years of education: Not on file   Highest education level: Bachelor's degree (e.g., BA, AB, BS)  Occupational History   Occupation: Runner, broadcasting/film/video  Tobacco Use   Smoking status: Never   Smokeless tobacco: Never  Vaping Use   Vaping Use: Never used  Substance and Sexual Activity   Alcohol use: Not Currently    Alcohol/week: 0.0 standard drinks   Drug use: No   Sexual activity: Yes    Partners: Male    Birth control/protection: I.U.D.    Comment: Mirena 05/03/20  Other Topics Concern   Not on file  Social History Narrative   Married, first son was born 11/13/2016      Patient is getting her Master's this summer.      Lives in [place] w/ husband.  Exercises 1-2 days/wk.   Social Determinants of Health   Financial Resource Strain: Not on file  Food Insecurity: Not on file  Transportation Needs: Not on file  Physical Activity: Not on file  Stress: Not on file  Social Connections: Not on file  Intimate Partner Violence: Not on file    Allergies:  No Known Allergies  Medications: Prior to Admission medications   Medication Sig Start Date End Date Taking? Authorizing Provider  buPROPion (WELLBUTRIN XL) 300 MG 24 hr tablet Take 1 tablet (300 mg total) by mouth daily. 04/16/21   Anna Whitehead, Anna Whitehead, Anna Whitehead  escitalopram (LEXAPRO) 20  MG tablet TAKE ONE TABLET (20 MG) BY MOUTH EVERY DAY 04/16/21   Anna Whitehead, Anna Whitehead, Anna Whitehead  levonorgestrel (MIRENA) 20 MCG/24HR IUD 1 Intra Uterine Device (1 each total) by Intrauterine route once for 1 dose. 05/03/20 05/03/20  Anna Novak, MD  norethindrone (MICRONOR) 0.35 MG tablet Take 1 tablet (0.35 mg total) by mouth daily. Patient not taking: Reported on 05/03/2020 03/20/20   Anna Novak, MD  omeprazole (PRILOSEC) 40 MG capsule Take 1 capsule (40 mg total) by mouth daily. 03/01/20 08/28/20  Wyline Mood, MD    Physical Exam Vitals: Blood pressure 122/74, height 5\' 10"  (1.778 m), weight 251 lb (113.9 kg), not  currently breastfeeding.  General: NAD HEENT: normocephalic, anicteric Thyroid: no enlargement, no palpable nodules Pulmonary: No increased work of breathing, CTAB Cardiovascular: RRR, distal pulses 2+ Breast: Breast symmetrical, no tenderness, no palpable nodules or masses, no skin or nipple retraction present, no nipple discharge.  No axillary or supraclavicular lymphadenopathy. Abdomen: NABS, soft, non-tender, non-distended.  Umbilicus without lesions.  No hepatomegaly, splenomegaly or masses palpable. No evidence of hernia  Genitourinary:deferred Extremities: no edema, erythema, or tenderness Neurologic: Grossly intact Psychiatric: mood appropriate, affect full  Assessment: 36 y.o. G2P2002 routine annual exam  Plan: Problem List Items Addressed This Visit       Other   Anxiety and depression   Relevant Medications   escitalopram (LEXAPRO) 20 MG tablet   buPROPion (WELLBUTRIN XL) 300 MG 24 hr tablet   Other Visit Diagnoses     Well woman exam    -  Primary   Relevant Orders   CBC w/Diff/Platelet   Lipid panel   Hemoglobin A1c   History of anemia       Relevant Orders   CBC w/Diff/Platelet   Screening cholesterol level       Relevant Orders   Lipid panel   Screening for diabetes mellitus       Relevant Orders   Hemoglobin A1c   Postpartum depression        Relevant Medications   escitalopram (LEXAPRO) 20 MG tablet   buPROPion (WELLBUTRIN XL) 300 MG 24 hr tablet       2) STI screening  wasoffered and declined  2)  ASCCP guidelines and rational discussed.  Patient opts for every 3 years screening interval  3) Contraception - the patient is currently using  IUD.  She is happy with her current form of contraception and plans to continue  4) Routine healthcare maintenance including cholesterol, diabetes screening discussed Ordered today  5) Pre conception: RTC to clinic for IUD removal when desired, start PNV and Folic acid supplement when ready toTTC, reviewed reaching optimal weigh prior to conception.   6) Lexapro and Wellbutrin reordered.   Carie CaddyLydia Glennis Borger, Anna Whitehead  [facility], [facility] 04/16/2021, 11:55 AM

## 2021-04-17 ENCOUNTER — Encounter: Payer: Self-pay | Admitting: Licensed Practical Nurse

## 2021-04-17 DIAGNOSIS — Z9189 Other specified personal risk factors, not elsewhere classified: Secondary | ICD-10-CM

## 2021-04-17 HISTORY — DX: Other specified personal risk factors, not elsewhere classified: Z91.89

## 2021-04-17 LAB — CBC WITH DIFFERENTIAL/PLATELET
Basophils Absolute: 0.1 10*3/uL (ref 0.0–0.2)
Basos: 1 %
EOS (ABSOLUTE): 0.5 10*3/uL — ABNORMAL HIGH (ref 0.0–0.4)
Eos: 8 %
Hematocrit: 43 % (ref 34.0–46.6)
Hemoglobin: 13.6 g/dL (ref 11.1–15.9)
Immature Grans (Abs): 0 10*3/uL (ref 0.0–0.1)
Immature Granulocytes: 0 %
Lymphocytes Absolute: 1.5 10*3/uL (ref 0.7–3.1)
Lymphs: 25 %
MCH: 28.3 pg (ref 26.6–33.0)
MCHC: 31.6 g/dL (ref 31.5–35.7)
MCV: 90 fL (ref 79–97)
Monocytes Absolute: 0.4 10*3/uL (ref 0.1–0.9)
Monocytes: 6 %
Neutrophils Absolute: 3.6 10*3/uL (ref 1.4–7.0)
Neutrophils: 60 %
Platelets: 241 10*3/uL (ref 150–450)
RBC: 4.8 x10E6/uL (ref 3.77–5.28)
RDW: 13.8 % (ref 11.7–15.4)
WBC: 6.1 10*3/uL (ref 3.4–10.8)

## 2021-04-17 LAB — HEMOGLOBIN A1C
Est. average glucose Bld gHb Est-mCnc: 111 mg/dL
Hgb A1c MFr Bld: 5.5 % (ref 4.8–5.6)

## 2021-04-17 LAB — LIPID PANEL
Chol/HDL Ratio: 4.8 ratio — ABNORMAL HIGH (ref 0.0–4.4)
Cholesterol, Total: 242 mg/dL — ABNORMAL HIGH (ref 100–199)
HDL: 50 mg/dL (ref >39)
LDL Chol Calc (NIH): 146 mg/dL — ABNORMAL HIGH (ref 0–99)
Triglycerides: 251 mg/dL — ABNORMAL HIGH (ref 0–149)
VLDL Cholesterol Cal: 46 mg/dL — ABNORMAL HIGH (ref 5–40)

## 2021-04-24 ENCOUNTER — Encounter: Payer: Self-pay | Admitting: Obstetrics and Gynecology

## 2021-08-26 ENCOUNTER — Other Ambulatory Visit: Payer: Self-pay | Admitting: Licensed Practical Nurse

## 2021-08-26 DIAGNOSIS — F419 Anxiety disorder, unspecified: Secondary | ICD-10-CM

## 2021-08-26 DIAGNOSIS — F53 Postpartum depression: Secondary | ICD-10-CM

## 2021-09-19 ENCOUNTER — Encounter: Payer: Self-pay | Admitting: Obstetrics & Gynecology

## 2021-09-19 ENCOUNTER — Ambulatory Visit (INDEPENDENT_AMBULATORY_CARE_PROVIDER_SITE_OTHER): Payer: Self-pay | Admitting: Obstetrics & Gynecology

## 2021-09-19 VITALS — BP 110/80 | Ht 70.0 in | Wt 258.0 lb

## 2021-09-19 DIAGNOSIS — F329 Major depressive disorder, single episode, unspecified: Secondary | ICD-10-CM

## 2021-09-19 NOTE — Progress Notes (Signed)
   Subjective:    Patient ID: Anna Whitehead, female    DOB: 22-Aug-1985, 36 y.o.   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   She states that Dr. Jean Rosenthal (previously of this office) did this for her when she was 6 weeks post partum. His note from that day does not state this exactly. He wrote that she needed a referral to psychiatry for "intense medication and counseling".   Please note that her youngest child is now 10 1/2 years old.  Review of Systems     Objective:   Physical Exam  Well nourished, well hydrated White female, no apparent distress She is ambulating and conversing normally. She became teary when I said that I would not write a letter recommending remote work for long term. She scored a 20 on depression scale (similar to the 19 she scored at 6 weeks postpartum)      Assessment & Plan:   Depresssion, chronic- no intent to harm self/others  I agreed to write the letter she requested for short term and she will need to get further recommendations/letters from a psychiatrist. I have placed a referral order today.

## 2021-11-19 DIAGNOSIS — M5136 Other intervertebral disc degeneration, lumbar region: Secondary | ICD-10-CM | POA: Diagnosis not present

## 2021-11-19 DIAGNOSIS — M62830 Muscle spasm of back: Secondary | ICD-10-CM | POA: Diagnosis not present

## 2021-11-19 DIAGNOSIS — M9905 Segmental and somatic dysfunction of pelvic region: Secondary | ICD-10-CM | POA: Diagnosis not present

## 2021-11-19 DIAGNOSIS — M9903 Segmental and somatic dysfunction of lumbar region: Secondary | ICD-10-CM | POA: Diagnosis not present

## 2021-12-02 ENCOUNTER — Ambulatory Visit: Payer: Self-pay | Admitting: Psychiatry

## 2021-12-03 ENCOUNTER — Ambulatory Visit: Payer: Self-pay | Admitting: Psychiatry

## 2021-12-12 ENCOUNTER — Encounter: Payer: Self-pay | Admitting: Nurse Practitioner

## 2021-12-12 ENCOUNTER — Ambulatory Visit: Payer: No Typology Code available for payment source

## 2021-12-12 ENCOUNTER — Ambulatory Visit: Payer: Self-pay | Admitting: *Deleted

## 2021-12-12 ENCOUNTER — Encounter: Payer: Self-pay | Admitting: Oncology

## 2021-12-12 ENCOUNTER — Ambulatory Visit: Payer: BC Managed Care – PPO | Admitting: Nurse Practitioner

## 2021-12-12 VITALS — BP 118/76 | HR 98 | Temp 98.4°F | Resp 16 | Ht 70.0 in | Wt 260.0 lb

## 2021-12-12 DIAGNOSIS — M9903 Segmental and somatic dysfunction of lumbar region: Secondary | ICD-10-CM | POA: Diagnosis not present

## 2021-12-12 DIAGNOSIS — M5136 Other intervertebral disc degeneration, lumbar region: Secondary | ICD-10-CM | POA: Diagnosis not present

## 2021-12-12 DIAGNOSIS — M62830 Muscle spasm of back: Secondary | ICD-10-CM | POA: Diagnosis not present

## 2021-12-12 DIAGNOSIS — M546 Pain in thoracic spine: Secondary | ICD-10-CM

## 2021-12-12 DIAGNOSIS — M9905 Segmental and somatic dysfunction of pelvic region: Secondary | ICD-10-CM | POA: Diagnosis not present

## 2021-12-12 DIAGNOSIS — E669 Obesity, unspecified: Secondary | ICD-10-CM

## 2021-12-12 MED ORDER — TIZANIDINE HCL 4 MG PO TABS: 4.0000 mg | ORAL_TABLET | Freq: Three times a day (TID) | ORAL | 0 refills | Status: DC | PRN

## 2021-12-12 NOTE — Progress Notes (Signed)
BP 118/76   Pulse 98   Temp 98.4 F (36.9 C)   Resp 16   Ht 5\' 10"  (1.778 m)   Wt 260 lb (117.9 kg)   SpO2 98%   BMI 37.31 kg/m    Subjective:    Patient ID: Anna Whitehead, female    DOB: Jan 09, 1986, 36 y.o.   MRN: [id number]  HPI: Atiyana Whitehead is a 36 y.o. female  Chief Complaint  Patient presents with   Back Pain    Right upper back- difficulty breathing yesterday   Back pain: Pain started 10 am she says that it would come in waves.  Patient denies any trauma. she says the pain was sharp. She says that it was hard to take a deep breath.  She says the pain lasted all day.  She took some ibuprofen and tylenol she says that she was able to sleep but still had pain.  She woke up this morning feeling much better, went to chiropractor and was adjusted.  Patient states the pain is now a sore pain.  Patient reports that she noticed that her heart rate also dropped to 50 bpm last night. Patient reports this has happened before and she had a full work up with cardiology but they did not determine the cause.  Patient's vital signs are stable today.  Blood pressure 118/76 heart rate 98.  Discussed with patient pain is likely musculoskeletal.  Will treat with muscle relaxer.  Can continue to take ibuprofen and Tylenol for pain.  We will get labs and an EKG to rule out other causes.  EKG showed: Sinus rhythm.   Obesity: Patient's current weight is 260 pounds with BMI of 37.31.  Patient would like to speak to a nutritionist regarding weight loss.  Placed referral for [facility].  Relevant past medical, surgical, family and social history reviewed and updated as indicated. Interim medical history since our last visit reviewed. Allergies and medications reviewed and updated.  Review of Systems  Constitutional: Negative for fever or weight change.  Respiratory: Negative for cough and shortness of breath.   Cardiovascular: Negative for chest pain or palpitations.   Gastrointestinal: Negative for abdominal pain, no bowel changes.  Musculoskeletal: Negative for gait problem or joint swelling.  Positive for right-sided upper back pain Skin: Negative for rash.  Neurological: Negative for dizziness or headache.  No other specific complaints in a complete review of systems (except as listed in HPI above).      Objective:    BP 118/76   Pulse 98   Temp 98.4 F (36.9 C)   Resp 16   Ht 5\' 10"  (1.778 m)   Wt 260 lb (117.9 kg)   SpO2 98%   BMI 37.31 kg/m   Wt Readings from Last 3 Encounters:  12/12/21 260 lb (117.9 kg)  09/19/21 258 lb (117 kg)  04/16/21 251 lb (113.9 kg)    Physical Exam  Constitutional: Patient appears well-developed and well-nourished. Obese  No distress.  HEENT: head atraumatic, normocephalic, pupils equal and reactive to light, neck supple Cardiovascular: Normal rate, regular rhythm and normal heart sounds.  No murmur heard. No BLE edema. Pulmonary/Chest: Effort normal and breath sounds normal. No respiratory distress. Abdominal: Soft.  There is no tenderness. Psychiatric: Patient has a normal mood and affect. behavior is normal. Judgment and thought content normal.  Results for orders placed or performed in visit on 04/16/21  CBC w/Diff/Platelet  Result Value Ref Range   WBC  6.1 3.4 - 10.8 x10E3/uL   RBC 4.80 3.77 - 5.28 x10E6/uL   Hemoglobin 13.6 11.1 - 15.9 g/dL   Hematocrit 43.0 34.0 - 46.6 %   MCV 90 79 - 97 fL   MCH 28.3 26.6 - 33.0 pg   MCHC 31.6 31.5 - 35.7 g/dL   RDW 13.8 11.7 - 15.4 %   Platelets 241 150 - 450 x10E3/uL   Neutrophils 60 Not Estab. %   Lymphs 25 Not Estab. %   Monocytes 6 Not Estab. %   Eos 8 Not Estab. %   Basos 1 Not Estab. %   Neutrophils Absolute 3.6 1.4 - 7.0 x10E3/uL   Lymphocytes Absolute 1.5 0.7 - 3.1 x10E3/uL   Monocytes Absolute 0.4 0.1 - 0.9 x10E3/uL   EOS (ABSOLUTE) 0.5 (H) 0.0 - 0.4 x10E3/uL   Basophils Absolute 0.1 0.0 - 0.2 x10E3/uL   Immature Granulocytes 0 Not Estab. %    Immature Grans (Abs) 0.0 0.0 - 0.1 x10E3/uL  Lipid panel  Result Value Ref Range   Cholesterol, Total 242 (H) 100 - 199 mg/dL   Triglycerides 251 (H) 0 - 149 mg/dL   HDL 50 >39 mg/dL   VLDL Cholesterol Cal 46 (H) 5 - 40 mg/dL   LDL Chol Calc (NIH) 146 (H) 0 - 99 mg/dL   Chol/HDL Ratio 4.8 (H) 0.0 - 4.4 ratio  Hemoglobin A1c  Result Value Ref Range   Hgb A1c MFr Bld 5.5 4.8 - 5.6 %   Est. average glucose Bld gHb Est-mCnc 111 mg/dL      Assessment & Plan:   Problem List Items Addressed This Visit   None Visit Diagnoses     Acute right-sided thoracic back pain    -  Primary   Discussed likely musculoskeletal continue taking Tylenol and ibuprofen for pain can take muscle relaxer.  May use warm heat.  We will also get labs and EKG   Relevant Medications   tiZANidine (ZANAFLEX) 4 MG tablet   Other Relevant Orders   EKG 12-Lead   CBC with Differential/Platelet   COMPLETE METABOLIC PANEL WITH GFR   TSH   Obesity (BMI 30-39.9)       Furl placed to weight and wellness   Relevant Orders   CBC with Differential/Platelet   COMPLETE METABOLIC PANEL WITH GFR   TSH   Amb Ref to Medical Weight Management        Follow up plan: Return if symptoms worsen or fail to improve.

## 2021-12-12 NOTE — Telephone Encounter (Signed)
  Chief Complaint: right upper back pain with difficulty breathing yesterday  Symptoms: right upper back pain comes and goes. Gone now. Was severe yesterday radiating through ribs. Caused difficulty breathing. Took tylenol / ibuprofen and decreased pain this am. Hx HR low 50's. Denies difficulty breathing now.  Frequency: yesterday Pertinent Negatives: Patient denies difficulty breathing no fever, no pain back at this time.  Disposition: [] ED /[] Urgent Care (no appt availability in office) / [x] Appointment(In office/virtual)/ []  [facility]/ [] Home Care/ [] Refused Recommended Disposition /[]  Mobile Bus/ []  Follow-up with PCP Additional Notes:   Na    Reason for Disposition  [1] MODERATE back pain (e.g., interferes with normal activities) AND [2] present > 3 days  Answer Assessment - Initial Assessment Questions 1. ONSET: "When did the pain begin?"      Yesterday 10 am  2. LOCATION: "Where does it hurt?" (upper, mid or lower back)     Right side back pain at shoulder blade area  3. SEVERITY: "How bad is the pain?"  (e.g., Scale 1-10; mild, moderate, or severe)   - MILD (1-3): Doesn't interfere with normal activities.    - MODERATE (4-7): Interferes with normal activities or awakens from sleep.    - SEVERE (8-10): Excruciating pain, unable to do any normal activities.      Mild today severe yesterday with difficulty breathing due to pain  4. PATTERN: "Is the pain constant?" (e.g., yes, no; constant, intermittent)      No pain now but feels uncomfortable  5. RADIATION: "Does the pain shoot into your legs or somewhere else?"     Tingling feeling in back through to ribs  6. CAUSE:  "What do you think is causing the back pain?"      Na  7. BACK OVERUSE:  "Any recent lifting of heavy objects, strenuous work or exercise?"     Class at dance studio 8. MEDICINES: "What have you taken so far for the pain?" (e.g., nothing, acetaminophen, NSAIDS)     Tylenol and ibuprofen   9. NEUROLOGIC SYMPTOMS: "Do you have any weakness, numbness, or problems with bowel/bladder control?"     No  10. OTHER SYMPTOMS: "Do you have any other symptoms?" (e.g., fever, abdomen pain, burning with urination, blood in urine)       Upper back pain causes difficulty breathing. Hx low pulse 50's 11. PREGNANCY: "Is there any chance you are pregnant?" "When was your last menstrual period?"       na  Protocols used: Back Pain-[facility]

## 2021-12-13 LAB — CBC WITH DIFFERENTIAL/PLATELET
Absolute Monocytes: 330 cells/uL (ref 200–950)
Basophils Absolute: 33 cells/uL (ref 0–200)
Basophils Relative: 0.5 %
Eosinophils Absolute: 356 cells/uL (ref 15–500)
Eosinophils Relative: 5.4 %
HCT: 40.7 % (ref 35.0–45.0)
Hemoglobin: 13.1 g/dL (ref 11.7–15.5)
Lymphs Abs: 1531 cells/uL (ref 850–3900)
MCH: 28.8 pg (ref 27.0–33.0)
MCHC: 32.2 g/dL (ref 32.0–36.0)
MCV: 89.5 fL (ref 80.0–100.0)
MPV: 10.5 fL (ref 7.5–12.5)
Monocytes Relative: 5 %
Neutro Abs: 4349 cells/uL (ref 1500–7800)
Neutrophils Relative %: 65.9 %
Platelets: 252 10*3/uL (ref 140–400)
RBC: 4.55 10*6/uL (ref 3.80–5.10)
RDW: 13.4 % (ref 11.0–15.0)
Total Lymphocyte: 23.2 %
WBC: 6.6 10*3/uL (ref 3.8–10.8)

## 2021-12-13 LAB — COMPLETE METABOLIC PANEL WITH GFR
AG Ratio: 1.6 (calc) (ref 1.0–2.5)
ALT: 12 U/L (ref 6–29)
AST: 13 U/L (ref 10–30)
Albumin: 4.2 g/dL (ref 3.6–5.1)
Alkaline phosphatase (APISO): 87 U/L (ref 31–125)
BUN: 9 mg/dL (ref 7–25)
CO2: 28 mmol/L (ref 20–32)
Calcium: 8.7 mg/dL (ref 8.6–10.2)
Chloride: 102 mmol/L (ref 98–110)
Creat: 0.97 mg/dL (ref 0.50–0.97)
Globulin: 2.7 g/dL (calc) (ref 1.9–3.7)
Glucose, Bld: 115 mg/dL (ref 65–139)
Potassium: 4.4 mmol/L (ref 3.5–5.3)
Sodium: 138 mmol/L (ref 135–146)
Total Bilirubin: 0.3 mg/dL (ref 0.2–1.2)
Total Protein: 6.9 g/dL (ref 6.1–8.1)
eGFR: 79 mL/min/{1.73_m2} (ref >=60)

## 2021-12-13 LAB — TSH: TSH: 1.63 mIU/L

## 2021-12-30 ENCOUNTER — Encounter (INDEPENDENT_AMBULATORY_CARE_PROVIDER_SITE_OTHER): Payer: No Typology Code available for payment source | Admitting: Internal Medicine

## 2021-12-31 ENCOUNTER — Ambulatory Visit
Admit: 2021-12-31 | Disposition: A | Discharge status: Home / Self Care | Payer: No Typology Code available for payment source

## 2021-12-31 ENCOUNTER — Telehealth: Payer: Self-pay

## 2021-12-31 ENCOUNTER — Ambulatory Visit: Payer: Self-pay

## 2021-12-31 NOTE — Telephone Encounter (Signed)
  Chief Complaint: Abdominal pain 7/10 - constant Symptoms: Chills, vomiting bile Frequency: 2 am this morning Pertinent Negatives: Patient denies Disposition: [x] ED /[] Urgent Care (no appt availability in office) / [] Appointment(In office/virtual)/ []  [place] [facility]/ [] Home Care/ [] Refused Recommended Disposition /[] [place] [facility]/ []  Follow-up with PCP Additional Notes: Pt states she was woken by intense stomach cramps at about 2am. Pain is 7/10 and only abated slightly with Tylenol at the first dose. A second dose had not effect on pain level. PT reports being unable to keep anything down, and is vomiting bile.  Reason for Disposition  [1] Vomiting AND [2] contains bile (green color)  Answer Assessment - Initial Assessment Questions 1. LOCATION: "Where does it hurt?"      Abdomen - period camps 2. RADIATION: "Does the pain shoot anywhere else?" (e.g., chest, back)     no 3. ONSET: "When did the pain begin?" (e.g., minutes, hours or days ago)      2 am last night 4. SUDDEN: "Gradual or sudden onset?"     sudden 5. PATTERN "Does the pain come and go, or is it constant?"    - If it comes and goes: "How long does it last?" "Do you have pain now?"     (Note: Comes and goes means the pain is intermittent. It goes away completely between bouts.)    - If constant: "Is it getting better, staying the same, or getting worse?"      (Note: Constant means the pain never goes away completely; most serious pain is constant and gets worse.)      constant 6. SEVERITY: "How bad is the pain?"  (e.g., Scale 1-10; mild, moderate, or severe)    - MILD (1-3): Doesn't interfere with normal activities, abdomen soft and not tender to touch.     - MODERATE (4-7): Interferes with normal activities or awakens from sleep, abdomen tender to touch.     - SEVERE (8-10): Excruciating pain, doubled over, unable to do any normal activities.       7/10 7. RECURRENT SYMPTOM: "Have you ever had this type  of stomach pain before?" If Yes, ask: "When was the last time?" and "What happened that time?"      no 8. CAUSE: "What do you think is causing the stomach pain?"     Unsure 9. RELIEVING/AGGRAVATING FACTORS: "What makes it better or worse?" (e.g., antacids, bending or twisting motion, bowel movement)     No - in bed all day. Tylenol not much help 10. OTHER SYMPTOMS: "Do you have any other symptoms?" (e.g., back pain, diarrhea, fever, urination pain, vomiting)       Chills, vomiting 11. PREGNANCY: "Is there any chance you are pregnant?" "When was your last menstrual period?"       no  Protocols used: Abdominal Pain - [facility]

## 2022-01-01 ENCOUNTER — Encounter: Payer: Self-pay | Admitting: Oncology

## 2022-01-01 ENCOUNTER — Other Ambulatory Visit
Admission: RE | Admit: 2022-01-01 | Discharge: 2022-01-01 | Disposition: A | Discharge status: Home / Self Care | Payer: BC Managed Care – PPO | Source: Ambulatory Visit | Attending: Nurse Practitioner | Admitting: Nurse Practitioner

## 2022-01-01 ENCOUNTER — Ambulatory Visit: Payer: Self-pay | Admitting: *Deleted

## 2022-01-01 ENCOUNTER — Ambulatory Visit: Admission: RE | Admit: 2022-01-01 | Payer: BC Managed Care – PPO | Source: Ambulatory Visit

## 2022-01-01 ENCOUNTER — Ambulatory Visit: Payer: BC Managed Care – PPO | Admitting: Nurse Practitioner

## 2022-01-01 ENCOUNTER — Encounter: Payer: Self-pay | Admitting: Nurse Practitioner

## 2022-01-01 ENCOUNTER — Telehealth: Payer: Self-pay

## 2022-01-01 VITALS — BP 124/82 | HR 104 | Resp 16 | Ht 70.0 in | Wt 258.3 lb

## 2022-01-01 DIAGNOSIS — R1031 Right lower quadrant pain: Secondary | ICD-10-CM | POA: Insufficient documentation

## 2022-01-01 LAB — COMPREHENSIVE METABOLIC PANEL
ALT: 16 U/L (ref 0–44)
AST: 18 U/L (ref 15–41)
Albumin: 4.3 g/dL (ref 3.5–5.0)
Alkaline Phosphatase: 78 U/L (ref 38–126)
Anion gap: 11 (ref 5–15)
BUN: 10 mg/dL (ref 6–20)
CO2: 26 mmol/L (ref 22–32)
Calcium: 8.8 mg/dL — ABNORMAL LOW (ref 8.9–10.3)
Chloride: 101 mmol/L (ref 98–111)
Creatinine, Ser: 0.98 mg/dL (ref 0.44–1.00)
GFR, Estimated: >60 mL/min (ref >60)
Glucose, Bld: 95 mg/dL (ref 70–99)
Potassium: 4.1 mmol/L (ref 3.5–5.1)
Sodium: 138 mmol/L (ref 135–145)
Total Bilirubin: 0.6 mg/dL (ref 0.3–1.2)
Total Protein: 7.8 g/dL (ref 6.5–8.1)

## 2022-01-01 LAB — POCT URINALYSIS DIPSTICK
Bilirubin, UA: NEGATIVE
Blood, UA: POSITIVE
Glucose, UA: NEGATIVE
Ketones, UA: NEGATIVE
Nitrite, UA: NEGATIVE
Odor: NORMAL
Protein, UA: NEGATIVE
Spec Grav, UA: 1.02 (ref 1.010–1.025)
Urobilinogen, UA: 0.2 E.U./dL
pH, UA: 6 (ref 5.0–8.0)

## 2022-01-01 LAB — CBC WITH DIFFERENTIAL/PLATELET
Abs Immature Granulocytes: 0.03 10*3/uL (ref 0.00–0.07)
Basophils Absolute: 0 10*3/uL (ref 0.0–0.1)
Basophils Relative: 1 %
Eosinophils Absolute: 0.3 10*3/uL (ref 0.0–0.5)
Eosinophils Relative: 3 %
HCT: 45.8 % (ref 36.0–46.0)
Hemoglobin: 14.4 g/dL (ref 12.0–15.0)
Immature Granulocytes: 0 %
Lymphocytes Relative: 19 %
Lymphs Abs: 1.6 10*3/uL (ref 0.7–4.0)
MCH: 27.9 pg (ref 26.0–34.0)
MCHC: 31.4 g/dL (ref 30.0–36.0)
MCV: 88.8 fL (ref 80.0–100.0)
Monocytes Absolute: 0.4 10*3/uL (ref 0.1–1.0)
Monocytes Relative: 5 %
Neutro Abs: 5.9 10*3/uL (ref 1.7–7.7)
Neutrophils Relative %: 72 %
Platelets: 293 10*3/uL (ref 150–400)
RBC: 5.16 MIL/uL — ABNORMAL HIGH (ref 3.87–5.11)
RDW: 14.4 % (ref 11.5–15.5)
WBC: 8.3 10*3/uL (ref 4.0–10.5)
nRBC: 0 % (ref 0.0–0.2)

## 2022-01-01 LAB — POCT URINE PREGNANCY: Preg Test, Ur: NEGATIVE

## 2022-01-01 NOTE — Progress Notes (Signed)
BP 124/82   Pulse (!) 104   Resp 16   Ht $R'5\' 10"'Lf$  (1.778 m)   Wt 258 lb 4.8 oz (117.2 kg)   SpO2 96%   BMI 37.06 kg/m    Subjective:    Patient ID: Anna Whitehead, female    DOB: 1986/01/20, 36 y.o.   MRN: [id number]  HPI: Anna Whitehead is a 36 y.o. female  Chief Complaint  Patient presents with   Abdominal Pain    W/ nausea and vomiting   Abdominal pain/nausea/vomiting: Patient reports yesterday around 2 am she woke up with severe abdominal pain, nausea, vomiting and diarrhea.  She reports she also had chills.  She says she does not think she had a fever.  She says that she went to a couple urgent cares and was told they could not help her. She went to the er and the wait was too long so she went back home and rested. She describes the pain as a constant cramp in her umbilical region.  She says yesterday the pain was 8/10 and pain has improved to a 4/10 right now.  She says that the pain is still there just not as intense.  She says she took some tylenol which did not help.  She denies any vomiting today. She has been able to drink today but has not eaten anything.  [facility]: IUD She says she has had one episode of diarrhea today.  She denies any urinary complaints.  Discussed with patient possible differentials. Will obtain urine, urine hcg, labs and ct scan.  After getting ct results may send in antiemetic if appropriate.   Relevant past medical, surgical, family and social history reviewed and updated as indicated. Interim medical history since our last visit reviewed. Allergies and medications reviewed and updated.  Review of Systems  Constitutional: Negative for fever or weight change.  Respiratory: Negative for cough and shortness of breath.   Cardiovascular: Negative for chest pain or palpitations.  Gastrointestinal: positive for abdominal pain, nausea, vomiting and diarrhea. Musculoskeletal: Negative for gait problem or joint swelling.  Skin: Negative for rash.   Neurological: Negative for dizziness or headache.  No other specific complaints in a complete review of systems (except as listed in HPI above).      Objective:    BP 124/82   Pulse (!) 104   Resp 16   Ht $R'5\' 10"'qG$  (1.778 m)   Wt 258 lb 4.8 oz (117.2 kg)   SpO2 96%   BMI 37.06 kg/m   Wt Readings from Last 3 Encounters:  01/01/22 258 lb 4.8 oz (117.2 kg)  12/12/21 260 lb (117.9 kg)  09/19/21 258 lb (117 kg)    Physical Exam  Constitutional: Patient appears well-developed and well-nourished. Obese  No distress.  HEENT: head atraumatic, normocephalic, pupils equal and reactive to light, neck supple Cardiovascular: Normal rate, regular rhythm and normal heart sounds.  No murmur heard. No BLE edema. Pulmonary/Chest: Effort normal and breath sounds normal. No respiratory distress. Abdominal: Soft.  Tenderness noted at umbilicus region and RLQ Psychiatric: Patient has a normal mood and affect. behavior is normal. Judgment and thought content normal.   Results for orders placed or performed in visit on 12/12/21  CBC with Differential/Platelet  Result Value Ref Range   WBC 6.6 3.8 - 10.8 Thousand/uL   RBC 4.55 3.80 - 5.10 Million/uL   Hemoglobin 13.1 11.7 - 15.5 g/dL   HCT 40.7 35.0 - 45.0 %   MCV 89.5 80.0 -  100.0 fL   MCH 28.8 27.0 - 33.0 pg   MCHC 32.2 32.0 - 36.0 g/dL   RDW 13.4 11.0 - 15.0 %   Platelets 252 140 - 400 Thousand/uL   MPV 10.5 7.5 - 12.5 fL   Neutro Abs 4,349 1,500 - 7,800 cells/uL   Lymphs Abs 1,531 850 - 3,900 cells/uL   Absolute Monocytes 330 200 - 950 cells/uL   Eosinophils Absolute 356 15 - 500 cells/uL   Basophils Absolute 33 0 - 200 cells/uL   Neutrophils Relative % 65.9 %   Total Lymphocyte 23.2 %   Monocytes Relative 5.0 %   Eosinophils Relative 5.4 %   Basophils Relative 0.5 %  COMPLETE METABOLIC PANEL WITH GFR  Result Value Ref Range   Glucose, Bld 115 65 - 139 mg/dL   BUN 9 7 - 25 mg/dL   Creat 0.97 0.50 - 0.97 mg/dL   eGFR 79 > OR = 60  mL/min/1.73m2   BUN/Creatinine Ratio SEE NOTE: 6 - 22 (calc)   Sodium 138 135 - 146 mmol/L   Potassium 4.4 3.5 - 5.3 mmol/L   Chloride 102 98 - 110 mmol/L   CO2 28 20 - 32 mmol/L   Calcium 8.7 8.6 - 10.2 mg/dL   Total Protein 6.9 6.1 - 8.1 g/dL   Albumin 4.2 3.6 - 5.1 g/dL   Globulin 2.7 1.9 - 3.7 g/dL (calc)   AG Ratio 1.6 1.0 - 2.5 (calc)   Total Bilirubin 0.3 0.2 - 1.2 mg/dL   Alkaline phosphatase (APISO) 87 31 - 125 U/L   AST 13 10 - 30 U/L   ALT 12 6 - 29 U/L  TSH  Result Value Ref Range   TSH 1.63 mIU/L      Assessment & Plan:   Problem List Items Addressed This Visit   None Visit Diagnoses     Right lower quadrant abdominal pain    -  Primary   Will get stat labs, ct scan. Will also obtain urine dip and upreg.     Relevant Orders   CT Abdomen Pelvis W Contrast   CBC with Differential/Platelet   Comprehensive metabolic panel   POCT urinalysis dipstick   POCT urine pregnancy        Follow up plan: Return if symptoms worsen or fail to improve.

## 2022-01-01 NOTE — Telephone Encounter (Signed)
  Chief Complaint: Abdominal Pain Symptoms: 4/10 pain at navel area, radiates to back at times. N/V/D and chills yesterday, not presently. Has not eaten Frequency: Yesterday 2am Pertinent Negatives: Patient denies vomiting Disposition: [] ED /[] Urgent Care (no appt availability in office) / [x] Appointment(In office/virtual)/ []  [place] [facility]/ [] Home Care/ [] Refused Recommended Disposition /[] [place] [facility]/ []  Follow-up with PCP Additional Notes: Appt secured for this afternoon. Advised ED for worsening symptoms. Care advise provided, pt verbalizes understanding. Reason for Disposition  [1] MILD-MODERATE pain AND [2] constant AND [3] present > 2 hours  Answer Assessment - Initial Assessment Questions 1. LOCATION: "Where does it hurt?"      Center around navel 2. RADIATION: "Does the pain shoot anywhere else?" (e.g., chest, back)     Back 3. ONSET: "When did the pain begin?" (e.g., minutes, hours or days ago)      Yesterday 2 am 4. SUDDEN: "Gradual or sudden onset?"      5. PATTERN "Does the pain come and go, or is it constant?"    - If it comes and goes: "How long does it last?" "Do you have pain now?"     (Note: Comes and goes means the pain is intermittent. It goes away completely between bouts.)    - If constant: "Is it getting better, staying the same, or getting worse?"      (Note: Constant means the pain never goes away completely; most serious pain is constant and gets worse.)      Constant 6. SEVERITY: "How bad is the pain?"  (e.g., Scale 1-10; mild, moderate, or severe)    - MILD (1-3): Doesn't interfere with normal activities, abdomen soft and not tender to touch.     - MODERATE (4-7): Interferes with normal activities or awakens from sleep, abdomen tender to touch.     - SEVERE (8-10): Excruciating pain, doubled over, unable to do any normal activities.       Yesterday 8-9/10  today 4/10 7. RECURRENT SYMPTOM: "Have you ever had this type of stomach pain  before?" If Yes, ask: "When was the last time?" and "What happened that time?"      no 8. CAUSE: "What do you think is causing the stomach pain?"     unsure 9. RELIEVING/AGGRAVATING FACTORS: "What makes it better or worse?" (e.g., antacids, bending or twisting motion, bowel movement)     NA 10. OTHER SYMPTOMS: "Do you have any other symptoms?" (e.g., back pain, diarrhea, fever, urination pain, vomiting)       Vomiting, chills, diarrhea yesterday,urine darker  Protocols used: Abdominal Pain - [employer]

## 2022-01-01 NOTE — Telephone Encounter (Signed)
Pt came in states when she got over there for CT scan it was going to cost 3,000.  Blood work did not show any elevated white count.  Pt denied CT at this time will give a couple of days to see if pain resolves.  Pt notified if gets worse to go to ER

## 2022-01-02 ENCOUNTER — Encounter: Payer: Self-pay | Admitting: Nurse Practitioner

## 2022-01-02 ENCOUNTER — Other Ambulatory Visit: Payer: Self-pay | Admitting: Nurse Practitioner

## 2022-01-02 DIAGNOSIS — N3000 Acute cystitis without hematuria: Secondary | ICD-10-CM

## 2022-01-02 LAB — URINE CULTURE
MICRO NUMBER:: 14068687
SPECIMEN QUALITY:: ADEQUATE

## 2022-01-02 MED ORDER — CEPHALEXIN 500 MG PO CAPS: 500.0000 mg | ORAL_CAPSULE | Freq: Four times a day (QID) | ORAL | 0 refills | Status: AC

## 2022-03-09 ENCOUNTER — Encounter: Payer: Self-pay | Admitting: Oncology

## 2022-03-19 ENCOUNTER — Other Ambulatory Visit: Payer: Self-pay

## 2022-03-19 DIAGNOSIS — F32A Depression, unspecified: Secondary | ICD-10-CM

## 2022-03-19 DIAGNOSIS — F419 Anxiety disorder, unspecified: Secondary | ICD-10-CM

## 2022-03-20 NOTE — Telephone Encounter (Signed)
Pt needs annual exam

## 2022-03-22 ENCOUNTER — Encounter: Payer: Self-pay | Admitting: Family Medicine

## 2022-06-04 DIAGNOSIS — M9905 Segmental and somatic dysfunction of pelvic region: Secondary | ICD-10-CM | POA: Diagnosis not present

## 2022-06-04 DIAGNOSIS — M9903 Segmental and somatic dysfunction of lumbar region: Secondary | ICD-10-CM | POA: Diagnosis not present

## 2022-06-04 DIAGNOSIS — M5136 Other intervertebral disc degeneration, lumbar region: Secondary | ICD-10-CM | POA: Diagnosis not present

## 2022-06-04 DIAGNOSIS — M62830 Muscle spasm of back: Secondary | ICD-10-CM | POA: Diagnosis not present

## 2022-06-10 ENCOUNTER — Ambulatory Visit (INDEPENDENT_AMBULATORY_CARE_PROVIDER_SITE_OTHER): Payer: BC Managed Care – PPO | Admitting: Nurse Practitioner

## 2022-06-10 ENCOUNTER — Encounter: Payer: Self-pay | Admitting: Nurse Practitioner

## 2022-06-10 VITALS — BP 132/90 | HR 111 | Ht 70.0 in | Wt 262.0 lb

## 2022-06-10 DIAGNOSIS — F32A Anxiety disorder, unspecified: Secondary | ICD-10-CM

## 2022-06-10 DIAGNOSIS — G4452 New daily persistent headache (NDPH): Secondary | ICD-10-CM | POA: Diagnosis not present

## 2022-06-10 DIAGNOSIS — E669 Obesity, unspecified: Secondary | ICD-10-CM

## 2022-06-10 DIAGNOSIS — F53 Postpartum depression: Secondary | ICD-10-CM

## 2022-06-10 DIAGNOSIS — M5430 Sciatica, unspecified side: Secondary | ICD-10-CM | POA: Diagnosis not present

## 2022-06-10 DIAGNOSIS — F419 Anxiety disorder, unspecified: Secondary | ICD-10-CM | POA: Diagnosis not present

## 2022-06-10 MED ORDER — ZEPBOUND 2.5 MG/0.5ML ~~LOC~~ SOAJ: 2.5000 mg | SUBCUTANEOUS | 3 refills | Status: DC

## 2022-06-10 MED ORDER — BUPROPION HCL ER (XL) 300 MG PO TB24: 300.0000 mg | ORAL_TABLET | Freq: Every day | ORAL | 1 refills | Status: DC

## 2022-06-10 MED ORDER — ESCITALOPRAM OXALATE 20 MG PO TABS: ORAL_TABLET | ORAL | 1 refills | Status: DC

## 2022-06-10 NOTE — Progress Notes (Unsigned)
New Patient Office Visit  Subjective    Patient ID: Anna Whitehead, female    DOB: 06-04-1985  Age: 37 y.o. MRN: [id number]  CC:  Chief Complaint  Patient presents with   Establish Care    New Patient    HPI Anna Whitehead presents to establish care Pt is new to office.  Hx of sciatica nerve pain, has seen chiropractor, 6 weeks having pain on right buttock to right foot.  Pt wants to start zepbound.  Has SAD intermittently, postpartum for anxiety/depression.  Agrees to psychiatry referral.  Swooshing in right ear during pregnancy and after.  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    Outpatient Encounter Medications as of 06/10/2022  Medication Sig   buPROPion (WELLBUTRIN XL) 300 MG 24 hr tablet TAKE 1 TABLET BY MOUTH DAILY   escitalopram (LEXAPRO) 20 MG tablet TAKE 1 TABLET BY MOUTH DAILY   levonorgestrel (MIRENA) 20 MCG/24HR IUD 1 Intra Uterine Device (1 each total) by Intrauterine route once for 1 dose.   omeprazole (PRILOSEC) 40 MG capsule Take 1 capsule (40 mg total) by mouth daily.   tirzepatide (ZEPBOUND) 2.5 MG/0.5ML Pen Inject 2.5 mg into the skin once a week.   [DISCONTINUED] tiZANidine (ZANAFLEX) 4 MG tablet Take 1 tablet (4 mg total) by mouth every 8 (eight) hours as needed for muscle spasms (muscle tightness).   No facility-administered encounter medications on file as of 06/10/2022.    Past Medical History:  Diagnosis Date   Allergic eczema    Anxiety    Depression    Dysmenorrhea    Family history of breast cancer    4/21 Cancer genetic testing letter sent; 2/23 pt states she has had neg testing   Increased risk of breast cancer 04/2021   per pt report; mammo to start at age 53   Iron deficiency anemia 10/03/2019   Iron deficiency anemia due to chronic blood loss    Menorrhagia    Midline low back pain with left-sided sciatica    Mild asthma    seasonal, not used recently   Overweight (BMI 25.0-29.9)    Right foot pain    Seasonal  allergic rhinitis     Past Surgical History:  Procedure Laterality Date   CESAREAN SECTION  02/04/2020   Procedure: CESAREAN SECTION;  Surgeon: Will Bonnet, MD;  Location: [facility] ORS;  Service: Obstetrics;;    Family History  Problem Relation Age of Onset   Bipolar disorder Mother    Breast cancer Maternal Aunt 73   Breast cancer Maternal Aunt 50   Pancreatic cancer Maternal Aunt 75   Ovarian cancer Maternal Great-grandmother     Social History   Socioeconomic History   Marital status: Married    Spouse name: Anna Whitehead   Number of children: 1   Years of education: Not on file   Highest education level: Bachelor's degree (e.g., BA, AB, BS)  Occupational History   Occupation: Pharmacist, hospital  Tobacco Use   Smoking status: Never   Smokeless tobacco: Never  Vaping Use   Vaping Use: Never used  Substance and Sexual Activity   Alcohol use: Not Currently    Alcohol/week: 0.0 standard drinks of alcohol   Drug use: No   Sexual activity: Yes    Partners: Male    Birth control/protection: I.U.D.    Comment: Mirena 05/03/20  Other Topics Concern   Not on file  Social History Narrative   Married, first son was born 11/13/2016  Patient is getting her Master's this summer.      Lives in [place] w/ husband.  Exercises 1-2 days/wk.   Social Determinants of Health   Financial Resource Strain: Not on file  Food Insecurity: No Food Insecurity (05/26/2018)   Hunger Vital Sign    Worried About Running Out of Food in the Last Year: Never true    Ran Out of Food in the Last Year: Never true  Transportation Needs: No Transportation Needs (05/26/2018)   PRAPARE - Hydrologist (Medical): No    Lack of Transportation (Non-Medical): No  Physical Activity: Sufficiently Active (05/26/2018)   Exercise Vital Sign    Days of Exercise per Week: 5 days    Minutes of Exercise per Session: 30 min  Stress: No Stress Concern Present (05/26/2018)   [place]    Feeling of Stress : Not at all  Social Connections: [place] (05/26/2018)   Social Connection and Isolation Panel [NHANES]    Frequency of Communication with Friends and Family: More than three times a week    Frequency of Social Gatherings with Friends and Family: Three times a week    Attends Religious Services: More than 4 times per year    Active Member of Clubs or Organizations: Yes    Attends Archivist Meetings: Never    Marital Status: Married  Human resources officer Violence: Not At Risk (05/26/2018)   Humiliation, Afraid, Rape, and Kick questionnaire    Fear of Current or Ex-Partner: No    Emotionally Abused: No    Physically Abused: No    Sexually Abused: No    Review of Systems  Constitutional: Negative.   HENT: Negative.    Eyes: Negative.   Respiratory: Negative.    Cardiovascular: Negative.   Gastrointestinal: Negative.   Genitourinary: Negative.   Musculoskeletal: Negative.   Skin: Negative.   Neurological:  Positive for headaches.  Endo/Heme/Allergies: Negative.   Psychiatric/Behavioral:  Positive for depression. The patient is nervous/anxious.         Objective    BP (!) 132/90   Pulse (!) 111   Ht 5\' 10"  (1.778 m)   Wt 262 lb (118.8 kg)   SpO2 98%   BMI 37.59 kg/m   Physical Exam Vitals reviewed.  Constitutional:      Appearance: Normal appearance.  HENT:     Head: Normocephalic.     Nose: Nose normal.     Mouth/Throat:     Mouth: Mucous membranes are moist.  Eyes:     Pupils: Pupils are equal, round, and reactive to light.  Cardiovascular:     Rate and Rhythm: Normal rate and regular rhythm.  Pulmonary:     Effort: Pulmonary effort is normal.     Breath sounds: Normal breath sounds.  Abdominal:     General: Bowel sounds are normal.     Palpations: Abdomen is soft.  Musculoskeletal:        General: Normal range of motion.     Cervical back: Normal range of  motion and neck supple.  Skin:    General: Skin is warm and dry.  Neurological:     Mental Status: She is alert and oriented to person, place, and time.  Psychiatric:        Mood and Affect: Mood normal.        Behavior: Behavior normal.     {Labs (Optional):23779}    Assessment &  Plan:   Problem List Items Addressed This Visit       Nervous and Auditory   Sciatic leg pain - Primary   Relevant Medications   tirzepatide (ZEPBOUND) 2.5 MG/0.5ML Pen   Other Relevant Orders   Ambulatory referral to Neurosurgery     Other   Anxiety   Relevant Orders   Ambulatory referral to Psychiatry   Obesity   Relevant Medications   tirzepatide (ZEPBOUND) 2.5 MG/0.5ML Pen   New daily persistent headache    No follow-ups on file.   Evern Bio, NP

## 2022-06-10 NOTE — Patient Instructions (Signed)
1) Follow up appt in 3 weeks, fasting labs prior 2) Referrals

## 2022-06-11 MED ORDER — CELECOXIB 100 MG PO CAPS: 100.0000 mg | ORAL_CAPSULE | Freq: Every day | ORAL | 2 refills | Status: DC

## 2022-06-23 ENCOUNTER — Encounter: Payer: Self-pay | Admitting: Nurse Practitioner

## 2022-06-23 ENCOUNTER — Telehealth: Payer: Self-pay

## 2022-06-23 NOTE — Telephone Encounter (Signed)
Patient called asking about moving forward with the leg [place] for possible DVT

## 2022-06-24 ENCOUNTER — Other Ambulatory Visit: Payer: Self-pay | Admitting: Nurse Practitioner

## 2022-06-24 DIAGNOSIS — R600 Localized edema: Secondary | ICD-10-CM

## 2022-07-03 ENCOUNTER — Encounter: Payer: Self-pay | Admitting: Nurse Practitioner

## 2022-07-03 ENCOUNTER — Other Ambulatory Visit: Payer: Self-pay | Admitting: Nurse Practitioner

## 2022-07-03 ENCOUNTER — Ambulatory Visit (INDEPENDENT_AMBULATORY_CARE_PROVIDER_SITE_OTHER): Payer: BC Managed Care – PPO | Admitting: Nurse Practitioner

## 2022-07-03 VITALS — BP 140/80 | HR 69 | Ht 70.0 in | Wt 262.8 lb

## 2022-07-03 DIAGNOSIS — E663 Overweight: Secondary | ICD-10-CM | POA: Diagnosis not present

## 2022-07-03 DIAGNOSIS — M79661 Pain in right lower leg: Secondary | ICD-10-CM | POA: Diagnosis not present

## 2022-07-03 DIAGNOSIS — R7301 Impaired fasting glucose: Secondary | ICD-10-CM | POA: Diagnosis not present

## 2022-07-03 DIAGNOSIS — M5441 Lumbago with sciatica, right side: Secondary | ICD-10-CM | POA: Diagnosis not present

## 2022-07-03 DIAGNOSIS — G8929 Other chronic pain: Secondary | ICD-10-CM

## 2022-07-03 DIAGNOSIS — F988 Other specified behavioral and emotional disorders with onset usually occurring in childhood and adolescence: Secondary | ICD-10-CM

## 2022-07-03 DIAGNOSIS — R5383 Other fatigue: Secondary | ICD-10-CM

## 2022-07-03 NOTE — Progress Notes (Signed)
Established Patient Office Visit  Subjective:  Patient ID: Anna Whitehead, female    DOB: 1986/02/11  Age: 37 y.o. MRN: [id number]  Chief Complaint  Patient presents with   Follow-up    3 mo follow up    3 week follow up, pt has not heard from [place] of right leg and will reorder.  Ordered MRI of lumbar spine for right sided pin radiating up from right posterior calf to right side of lumbar region.      No other concerns at this time.   Past Medical History:  Diagnosis Date   Allergic eczema    Anxiety    Depression    Dysmenorrhea    Family history of breast cancer    4/21 Cancer genetic testing letter sent; 2/23 pt states she has had neg testing   Increased risk of breast cancer 04/2021   per pt report; mammo to start at age 47   Iron deficiency anemia 10/03/2019   Iron deficiency anemia due to chronic blood loss    Menorrhagia    Midline low back pain with left-sided sciatica    Mild asthma    seasonal, not used recently   Overweight (BMI 25.0-29.9)    Right foot pain    Seasonal allergic rhinitis     Past Surgical History:  Procedure Laterality Date   CESAREAN SECTION  02/04/2020   Procedure: CESAREAN SECTION;  Surgeon: Conard Novak, MD;  Location: [facility] ORS;  Service: Obstetrics;;    Social History   Socioeconomic History   Marital status: Married    Spouse name: Para March   Number of children: 1   Years of education: Not on file   Highest education level: Bachelor's degree (e.g., BA, AB, BS)  Occupational History   Occupation: Runner, broadcasting/film/video  Tobacco Use   Smoking status: Never   Smokeless tobacco: Never  Vaping Use   Vaping Use: Never used  Substance and Sexual Activity   Alcohol use: Not Currently    Alcohol/week: 0.0 standard drinks of alcohol   Drug use: No   Sexual activity: Yes    Partners: Male    Birth control/protection: I.U.D.    Comment: Mirena 05/03/20  Other Topics Concern   Not on file  Social History Narrative   Married, first  son was born 11/13/2016      Patient is getting her Master's this summer.      Lives in [place] w/ husband.  Exercises 1-2 days/wk.   Social Determinants of Health   Financial Resource Strain: Not on file  Food Insecurity: No Food Insecurity (05/26/2018)   Hunger Vital Sign    Worried About Running Out of Food in the Last Year: Never true    Ran Out of Food in the Last Year: Never true  Transportation Needs: No Transportation Needs (05/26/2018)   PRAPARE - Administrator, Civil Service (Medical): No    Lack of Transportation (Non-Medical): No  Physical Activity: Sufficiently Active (05/26/2018)   Exercise Vital Sign    Days of Exercise per Week: 5 days    Minutes of Exercise per Session: 30 min  Stress: No Stress Concern Present (05/26/2018)   [employer] of Occupational Health - Occupational Stress Questionnaire    Feeling of Stress : Not at all  Social Connections: Socially Integrated (05/26/2018)   Social Connection and Isolation Panel [NHANES]    Frequency of Communication with Friends and Family: More than three times a week    Frequency  of Social Gatherings with Friends and Family: Three times a week    Attends Religious Services: More than 4 times per year    Active Member of Clubs or Organizations: Yes    Attends Banker Meetings: Never    Marital Status: Married  Catering manager Violence: Not At Risk (05/26/2018)   Humiliation, Afraid, Rape, and Kick questionnaire    Fear of Current or Ex-Partner: No    Emotionally Abused: No    Physically Abused: No    Sexually Abused: No    Family History  Problem Relation Age of Onset   Bipolar disorder Mother    Breast cancer Maternal Aunt 40   Breast cancer Maternal Aunt 50   Pancreatic cancer Maternal Aunt 30   Ovarian cancer Maternal Great-grandmother     No Known Allergies  Review of Systems  Constitutional: Negative.   HENT: Negative.    Eyes: Negative.   Respiratory: Negative.     Cardiovascular: Negative.   Gastrointestinal: Negative.   Genitourinary: Negative.   Musculoskeletal:  Positive for joint pain and myalgias.  Skin: Negative.   Neurological: Negative.   Endo/Heme/Allergies: Negative.   Psychiatric/Behavioral: Negative.         Objective:   BP (!) 140/80   Pulse 69   Ht  (1.778 m)   Wt 262 lb 12.8 oz (119.2 kg)   SpO2 98%   BMI 37.71 kg/m   Vitals:   07/03/22 1051  BP: (!) 140/80  Pulse: 69  Height:  (1.778 m)  Weight: 262 lb 12.8 oz (119.2 kg)  SpO2: 98%  BMI (Calculated): 37.71    Physical Exam Vitals reviewed.  Constitutional:      Appearance: Normal appearance.  HENT:     Head: Normocephalic.     Nose: Nose normal.     Mouth/Throat:     Mouth: Mucous membranes are moist.  Eyes:     Pupils: Pupils are equal, round, and reactive to light.  Cardiovascular:     Rate and Rhythm: Normal rate and regular rhythm.  Pulmonary:     Effort: Pulmonary effort is normal.     Breath sounds: Normal breath sounds.  Abdominal:     General: Bowel sounds are normal.     Palpations: Abdomen is soft.  Musculoskeletal:        General: Normal range of motion.     Cervical back: Normal range of motion and neck supple.  Skin:    General: Skin is warm and dry.  Neurological:     Mental Status: She is alert and oriented to person, place, and time.  Psychiatric:        Mood and Affect: Mood normal.        Behavior: Behavior normal.      No results found for any visits on 07/03/22.  No results found for this or any previous visit (from the past 2160 hour(s)).    Assessment & Plan:   Problem List Items Addressed This Visit       Other   Overweight   Relevant Orders   Lipid panel   Other Visit Diagnoses     Right calf pain    -  Primary   Relevant Orders   VAS [place] LOWER EXTREMITY VENOUS (DVT)   Chronic right-sided low back pain with right-sided sciatica       Relevant Orders   MR Lumbar Spine Wo Contrast    Impaired fasting blood sugar  Relevant Orders   Hemoglobin A1c   Other fatigue       Relevant Orders   TSH   CMP14+EGFR       No follow-ups on file.   Total time spent: 35 minutes  Orson Eva, NP  07/03/2022

## 2022-07-03 NOTE — Patient Instructions (Signed)
1) [place] [facility]ention Specialist in [place] for ADD or ADHD testing 2) Right entire leg venous doppler US to r/o DVT 3) MRI without lumbar spine  4) Fasting labs today 5) Follow up appt in 4 weeks

## 2022-07-04 LAB — CMP14+EGFR
ALT: 20 IU/L (ref 0–32)
AST: 19 IU/L (ref 0–40)
Albumin/Globulin Ratio: 1.6 (ref 1.2–2.2)
Albumin: 4.3 g/dL (ref 3.9–4.9)
Alkaline Phosphatase: 81 IU/L (ref 44–121)
BUN/Creatinine Ratio: 15 (ref 9–23)
BUN: 12 mg/dL (ref 6–20)
Bilirubin Total: 0.2 mg/dL (ref 0.0–1.2)
CO2: 25 mmol/L (ref 20–29)
Calcium: 9.3 mg/dL (ref 8.7–10.2)
Chloride: 100 mmol/L (ref 96–106)
Creatinine, Ser: 0.82 mg/dL (ref 0.57–1.00)
Globulin, Total: 2.7 g/dL (ref 1.5–4.5)
Glucose: 79 mg/dL (ref 70–99)
Potassium: 4.4 mmol/L (ref 3.5–5.2)
Sodium: 139 mmol/L (ref 134–144)
Total Protein: 7 g/dL (ref 6.0–8.5)
eGFR: 96 mL/min/{1.73_m2} (ref >59)

## 2022-07-04 LAB — HEMOGLOBIN A1C
Est. average glucose Bld gHb Est-mCnc: 111 mg/dL
Hgb A1c MFr Bld: 5.5 % (ref 4.8–5.6)

## 2022-07-04 LAB — LIPID PANEL
Chol/HDL Ratio: 4.8 ratio — ABNORMAL HIGH (ref 0.0–4.4)
Cholesterol, Total: 223 mg/dL — ABNORMAL HIGH (ref 100–199)
HDL: 46 mg/dL (ref >39)
LDL Chol Calc (NIH): 150 mg/dL — ABNORMAL HIGH (ref 0–99)
Triglycerides: 147 mg/dL (ref 0–149)
VLDL Cholesterol Cal: 27 mg/dL (ref 5–40)

## 2022-07-04 LAB — TSH: TSH: 2.29 u[IU]/mL (ref 0.450–4.500)

## 2022-07-07 ENCOUNTER — Encounter: Payer: Self-pay | Admitting: Nurse Practitioner

## 2022-07-22 ENCOUNTER — Institutional Professional Consult (permissible substitution): Payer: No Typology Code available for payment source | Admitting: Neurology

## 2022-07-24 ENCOUNTER — Encounter: Payer: Self-pay | Admitting: Neurology

## 2022-07-24 ENCOUNTER — Institutional Professional Consult (permissible substitution): Payer: No Typology Code available for payment source | Admitting: Neurology

## 2022-07-31 ENCOUNTER — Ambulatory Visit: Payer: BC Managed Care – PPO | Admitting: Nurse Practitioner

## 2022-08-05 ENCOUNTER — Encounter: Payer: Self-pay | Admitting: Nurse Practitioner

## 2022-08-08 ENCOUNTER — Ambulatory Visit: Payer: BC Managed Care – PPO | Admitting: Nurse Practitioner

## 2022-08-13 ENCOUNTER — Ambulatory Visit: Payer: BC Managed Care – PPO

## 2022-08-13 DIAGNOSIS — M79661 Pain in right lower leg: Secondary | ICD-10-CM

## 2022-08-14 ENCOUNTER — Telehealth: Payer: Self-pay | Admitting: Nurse Practitioner

## 2022-08-14 ENCOUNTER — Other Ambulatory Visit: Payer: Self-pay | Admitting: Nurse Practitioner

## 2022-08-14 DIAGNOSIS — M5430 Sciatica, unspecified side: Secondary | ICD-10-CM

## 2022-08-14 NOTE — Progress Notes (Signed)
Also she can self referral to [facility] for further specialist workup, outside of primary care.

## 2022-08-14 NOTE — Progress Notes (Signed)
Patient notified

## 2022-08-14 NOTE — Telephone Encounter (Signed)
Patient needs referral to neurosurgery for sciatica/calf pain. Please advise. [place] or [place] area preferably.

## 2022-08-14 NOTE — Telephone Encounter (Signed)
sent 

## 2022-08-22 ENCOUNTER — Ambulatory Visit: Payer: BC Managed Care – PPO

## 2022-09-10 ENCOUNTER — Other Ambulatory Visit: Payer: Self-pay | Admitting: Licensed Practical Nurse

## 2022-09-10 DIAGNOSIS — F53 Postpartum depression: Secondary | ICD-10-CM

## 2022-09-10 DIAGNOSIS — F419 Anxiety disorder, unspecified: Secondary | ICD-10-CM

## 2022-09-19 ENCOUNTER — Other Ambulatory Visit: Payer: Self-pay | Admitting: Nurse Practitioner

## 2022-11-26 ENCOUNTER — Encounter: Payer: Self-pay | Admitting: Family Medicine

## 2022-11-26 ENCOUNTER — Encounter: Payer: Self-pay | Admitting: Nurse Practitioner

## 2022-11-27 ENCOUNTER — Other Ambulatory Visit: Payer: Self-pay | Admitting: Nurse Practitioner

## 2022-11-27 DIAGNOSIS — E669 Obesity, unspecified: Secondary | ICD-10-CM

## 2022-11-27 MED ORDER — SEMAGLUTIDE-WEIGHT MANAGEMENT 0.25 MG/0.5ML ~~LOC~~ SOAJ: 0.2500 mg | SUBCUTANEOUS | 0 refills | Status: AC

## 2022-12-10 DIAGNOSIS — M9903 Segmental and somatic dysfunction of lumbar region: Secondary | ICD-10-CM | POA: Diagnosis not present

## 2022-12-10 DIAGNOSIS — M62830 Muscle spasm of back: Secondary | ICD-10-CM | POA: Diagnosis not present

## 2022-12-10 DIAGNOSIS — M9905 Segmental and somatic dysfunction of pelvic region: Secondary | ICD-10-CM | POA: Diagnosis not present

## 2022-12-10 DIAGNOSIS — M5136 Other intervertebral disc degeneration, lumbar region: Secondary | ICD-10-CM | POA: Diagnosis not present

## 2022-12-24 DIAGNOSIS — S8002XA Contusion of left knee, initial encounter: Secondary | ICD-10-CM | POA: Diagnosis not present

## 2022-12-24 DIAGNOSIS — S93402A Sprain of unspecified ligament of left ankle, initial encounter: Secondary | ICD-10-CM | POA: Diagnosis not present

## 2022-12-29 DIAGNOSIS — J189 Pneumonia, unspecified organism: Secondary | ICD-10-CM | POA: Diagnosis not present

## 2022-12-29 DIAGNOSIS — R059 Cough, unspecified: Secondary | ICD-10-CM | POA: Diagnosis not present

## 2023-01-02 DIAGNOSIS — J189 Pneumonia, unspecified organism: Secondary | ICD-10-CM | POA: Diagnosis not present

## 2023-01-30 ENCOUNTER — Emergency Department (HOSPITAL_BASED_OUTPATIENT_CLINIC_OR_DEPARTMENT_OTHER)
Admission: EM | Admit: 2023-01-30 | Discharge: 2023-01-30 | Disposition: A | Discharge status: Home / Self Care | Payer: BC Managed Care – PPO | Attending: Emergency Medicine | Admitting: Emergency Medicine

## 2023-01-30 ENCOUNTER — Other Ambulatory Visit (HOSPITAL_BASED_OUTPATIENT_CLINIC_OR_DEPARTMENT_OTHER): Payer: Self-pay

## 2023-01-30 ENCOUNTER — Encounter: Payer: Self-pay | Admitting: Oncology

## 2023-01-30 ENCOUNTER — Other Ambulatory Visit: Payer: Self-pay

## 2023-01-30 ENCOUNTER — Encounter (HOSPITAL_BASED_OUTPATIENT_CLINIC_OR_DEPARTMENT_OTHER): Payer: Self-pay | Admitting: Emergency Medicine

## 2023-01-30 DIAGNOSIS — J36 Peritonsillar abscess: Secondary | ICD-10-CM | POA: Diagnosis not present

## 2023-01-30 DIAGNOSIS — J029 Acute pharyngitis, unspecified: Secondary | ICD-10-CM | POA: Diagnosis not present

## 2023-01-30 MED ORDER — CLINDAMYCIN PHOSPHATE 300 MG/50ML IV SOLN
300.0000 mg | Freq: Once | INTRAVENOUS | Status: AC
  Administered 2023-01-30: 300 mg via INTRAVENOUS
  Filled 2023-01-30: qty 50

## 2023-01-30 MED ORDER — KETOROLAC TROMETHAMINE 30 MG/ML IJ SOLN
30.0000 mg | Freq: Once | INTRAMUSCULAR | Status: AC
  Administered 2023-01-30: 30 mg via INTRAVENOUS
  Filled 2023-01-30: qty 1

## 2023-01-30 MED ORDER — LIDOCAINE VISCOUS HCL 2 % MT SOLN
10.0000 mL | OROMUCOSAL | 1 refills | Status: DC | PRN
Start: 2023-01-30 — End: 2023-04-22
  Filled 2023-01-30: qty 100, 2d supply, fill #0

## 2023-01-30 MED ORDER — SODIUM CHLORIDE 0.9 % IV BOLUS
500.0000 mL | Freq: Once | INTRAVENOUS | Status: AC
  Administered 2023-01-30: 500 mL via INTRAVENOUS

## 2023-01-30 MED ORDER — CLINDAMYCIN HCL 150 MG PO CAPS
300.0000 mg | ORAL_CAPSULE | Freq: Three times a day (TID) | ORAL | 0 refills | Status: AC
  Filled 2023-01-30: qty 42, 7d supply, fill #0

## 2023-01-30 MED ORDER — OXYCODONE HCL 5 MG PO TABS
2.5000 mg | ORAL_TABLET | Freq: Four times a day (QID) | ORAL | 0 refills | Status: DC | PRN
  Filled 2023-01-30: qty 6, 2d supply, fill #0

## 2023-01-30 MED ORDER — DIPHENHYDRAMINE HCL 12.5 MG/5ML PO ELIX
12.5000 mg | ORAL_SOLUTION | Freq: Once | ORAL | Status: AC
  Administered 2023-01-30: 12.5 mg via ORAL
  Filled 2023-01-30: qty 10

## 2023-01-30 MED ORDER — ALUM & MAG HYDROXIDE-SIMETH 200-200-20 MG/5ML PO SUSP
30.0000 mL | Freq: Once | ORAL | Status: AC
  Administered 2023-01-30: 30 mL via ORAL
  Filled 2023-01-30: qty 30

## 2023-01-30 MED ORDER — METHYLPREDNISOLONE 4 MG PO TBPK
ORAL_TABLET | ORAL | 0 refills | Status: DC
Start: 2023-01-30 — End: 2023-04-22
  Filled 2023-01-30: qty 21, 6d supply, fill #0

## 2023-01-30 MED ORDER — LIDOCAINE VISCOUS HCL 2 % MT SOLN
15.0000 mL | Freq: Once | OROMUCOSAL | Status: AC
  Administered 2023-01-30: 15 mL via ORAL
  Filled 2023-01-30: qty 15

## 2023-01-30 NOTE — ED Provider Notes (Signed)
[facility] EMERGENCY DEPARTMENT AT [facility] Provider Note   CSN: [id number] Arrival date & time: 01/30/23  1243     History {Add pertinent medical, surgical, social history, OB history to HPI:1} No chief complaint on file.   Anna Whitehead is a 37 y.o. femaleWho presents emergency department with a chief complaint of sore throat.  Patient was seen at an urgent care and in the emergency department yesterday and [place].  She ended up getting a strep swab and a mono swab along with a CT soft tissue of the throat which showed some mild fat stranding, left greater than right swelling of the tonsillar region and 2 large reactive lymph nodes on the left side without evidence of phlegmon or fluid collection.  Patient was seen at an urgent care this morning and told she needed to come in because she might have a peritonsillar abscess states she has been unable to take her antibiotics or ibuprofen because she was told that she could get an ulcer and she has been able to eat because she is having a lot of trouble swallowing even her own spit.  HPI     Home Medications Prior to Admission medications   Medication Sig Start Date End Date Taking? Authorizing Provider  buPROPion (WELLBUTRIN XL) 300 MG 24 hr tablet TAKE 1 TABLET BY MOUTH DAILY 09/11/22   Dominic, Courtney Heys, CNM  celecoxib (CELEBREX) 100 MG capsule Take 1 capsule (100 mg total) by mouth daily. 06/11/22 06/11/23  Orson Eva, NP  escitalopram (LEXAPRO) 20 MG tablet TAKE 1 TABLET BY MOUTH DAILY 06/10/22   Orson Eva, NP  levonorgestrel (MIRENA) 20 MCG/24HR IUD 1 Intra Uterine Device (1 each total) by Intrauterine route once for 1 dose. 05/03/20 07/03/22  Conard Novak, MD  omeprazole (PRILOSEC) 40 MG capsule TAKE 1 CAPSULE BY MOUTH ONCE DAILY 09/22/22   Miki Kins, FNP      Allergies    Patient has no known allergies.    Review of Systems   Review of Systems  Physical Exam Updated Vital Signs BP  (!) 131/105 (BP Location: Right Arm)   Pulse 72   Temp 98.8 F (37.1 C)   Resp 18   Ht 5\' 10"  (1.778 m)   Wt 117.9 kg   SpO2 98%   BMI 37.31 kg/m  Physical Exam Vitals and nursing note reviewed.  Constitutional:      General: She is not in acute distress.    Appearance: She is well-developed. She is not diaphoretic.  HENT:     Head: Normocephalic and atraumatic.     Right Ear: External ear normal.     Left Ear: External ear normal.     Nose: Nose normal.     Mouth/Throat:     Mouth: Mucous membranes are moist.     Comments: Bilateral tonsillar erythema, erythema of the soft palate and uvula.  The left palatine arch and soft palate are deeply erythematous and violaceous appear.  There is a central vesicle or a shallow ulcer noted in the center of the most erythematous region.  No uvular deviation or swelling of the pharyngeal arch.  Mild left greater than right cervical chain adenopathy. Eyes:     General: No scleral icterus.    Conjunctiva/sclera: Conjunctivae normal.  Cardiovascular:     Rate and Rhythm: Normal rate and regular rhythm.     Heart sounds: Normal heart sounds. No murmur heard.    No friction rub. No gallop.  Pulmonary:  Effort: Pulmonary effort is normal. No respiratory distress.     Breath sounds: Normal breath sounds.  Abdominal:     General: Bowel sounds are normal. There is no distension.     Palpations: Abdomen is soft. There is no mass.     Tenderness: There is no abdominal tenderness. There is no guarding.  Musculoskeletal:     Cervical back: Normal range of motion.  Skin:    General: Skin is warm and dry.  Neurological:     Mental Status: She is alert and oriented to person, place, and time.  Psychiatric:        Behavior: Behavior normal.     ED Results / Procedures / Treatments   Labs (all labs ordered are listed, but only abnormal results are displayed) Labs Reviewed - No data to display  EKG None  Radiology No results  found.  Procedures Procedures  {Document cardiac monitor, telemetry assessment procedure when appropriate:1}  Medications Ordered in ED Medications  clindamycin (CLEOCIN) IVPB 300 mg (has no administration in time range)  alum & mag hydroxide-simeth (MAALOX/MYLANTA) 200-200-20 MG/5ML suspension 30 mL (30 mLs Oral Given 01/30/23 1422)    And  lidocaine (XYLOCAINE) 2 % viscous mouth solution 15 mL (15 mLs Oral Given 01/30/23 1422)  diphenhydrAMINE (BENADRYL) 12.5 MG/5ML elixir 12.5 mg (12.5 mg Oral Given 01/30/23 1421)  sodium chloride 0.9 % bolus 500 mL (500 mLs Intravenous New Bag/Given 01/30/23 1425)  ketorolac (TORADOL) 30 MG/ML injection 30 mg (30 mg Intravenous Given 01/30/23 1416)    ED Course/ Medical Decision Making/ A&P   {   Click here for ABCD2, HEART and other calculatorsREFRESH Note before signing :1}                              Medical Decision Making Risk OTC drugs. Prescription drug management.   ***  {Document critical care time when appropriate:1} {Document review of labs and clinical decision tools ie heart score, Chads2Vasc2 etc:1}  {Document your independent review of radiology images, and any outside records:1} {Document your discussion with family members, caretakers, and with consultants:1} {Document social determinants of health affecting pt's care:1} {Document your decision making why or why not admission, treatments were needed:1} Final Clinical Impression(s) / ED Diagnoses Final diagnoses:  None    Rx / DC Orders ED Discharge Orders     None

## 2023-01-30 NOTE — ED Triage Notes (Signed)
Pt arrived POV. Seen yesterday in [facility], sent to ED (in [place] on recent trip) and was sent home with Tonsilitis diagnosis. Tylenol taken this am. ABX Rx, Amoxicillin Rx, and steroid given yesterday. Unable to take any of these yet d/t difficulty eating.

## 2023-01-30 NOTE — Discharge Instructions (Signed)
Contact a health care provider if: You have pain or swelling that gets worse. You develop difficulty swallowing. You have a fever that does not improve after you take medicine. Your voice changes. You see pus around or near your tonsils. This may look like yellowish-white fluid. Get help right away if: You have trouble breathing. You have severe pain. You cough up bloody spit. You are unable to swallow. You cannot stop drooling. These symptoms may represent a serious problem that is an emergency. Do not wait to see if the symptoms will go away. Get medical help right away. Call your local emergency services (911 in the [place] Do not drive yourself to the hospital.

## 2023-01-30 NOTE — ED Notes (Signed)
 RN reviewed discharge instructions with pt. Pt verbalized understanding and had no further questions. VSS upon discharge.  

## 2023-02-03 LAB — HM HEPATITIS C SCREENING LAB: HM Hepatitis Screen: NEGATIVE

## 2023-02-19 ENCOUNTER — Telehealth: Payer: Self-pay

## 2023-02-19 NOTE — Telephone Encounter (Signed)
Transition Care Management Unsuccessful Follow-up Telephone Call  Date of discharge and from where:  [facility] 11/15  Attempts:  1st Attempt  Reason for unsuccessful TCM follow-up call:  No answer/busy   Lenard Forth [place]  [facility], [facility] Guide, Phone: [phone] Website: [email or web address]

## 2023-02-20 ENCOUNTER — Telehealth: Payer: Self-pay

## 2023-02-20 NOTE — Telephone Encounter (Signed)
 Transition Care Management Unsuccessful Follow-up Telephone Call  Date of discharge and from where:  [facility] 11/15  Attempts:  2nd Attempt  Reason for unsuccessful TCM follow-up call:  No answer/busy   Lenard Forth [place]  [facility], [facility] Guide, Phone: [phone] Website: [email or web address]

## 2023-03-12 ENCOUNTER — Other Ambulatory Visit: Payer: Self-pay | Admitting: Family

## 2023-03-12 ENCOUNTER — Encounter: Payer: Self-pay | Admitting: Oncology

## 2023-03-16 ENCOUNTER — Encounter: Payer: Self-pay | Admitting: Oncology

## 2023-04-02 ENCOUNTER — Ambulatory Visit: Payer: BC Managed Care – PPO | Admitting: Orthopedic Surgery

## 2023-04-07 NOTE — Progress Notes (Unsigned)
Referring Physician:  Alba Cory, MD [address] [place],  [place] [address]  Primary Physician:  Anna Cory, MD  History of Present Illness: 04/07/2023 [name] is here today with a chief complaint of ***  Cerebral venous sinus thrombosis  Duration: *** Location: *** Quality: *** Severity: ***  Precipitating: aggravated by *** Modifying factors: made better by *** Weakness: none Timing: *** Bowel/Bladder Dysfunction: none  Conservative measures:  Physical therapy: has not participated in PT  Multimodal medical therapy including regular antiinflammatories: Celebrex, Oxycodone,  Injections: no epidural steroid injections  Past Surgery: none  Karn Barona has ***no symptoms of cervical myelopathy.  The symptoms are causing a significant impact on the patient's life.   I have utilized the care everywhere function in epic to review the outside records available from external health systems.  Review of Systems:  A 10 point review of systems is negative, except for the pertinent positives and negatives detailed in the HPI.  Past Medical History: Past Medical History:  Diagnosis Date   Allergic eczema    Anxiety    Depression    Dysmenorrhea    Family history of breast cancer    4/21 Cancer genetic testing letter sent; 2/23 pt states she has had neg testing   Increased risk of breast cancer 04/2021   per pt report; mammo to start at age 69   Iron deficiency anemia 10/03/2019   Iron deficiency anemia due to chronic blood loss    Menorrhagia    Midline low back pain with left-sided sciatica    Mild asthma    seasonal, not used recently   Overweight (BMI 25.0-29.9)    Right foot pain    Seasonal allergic rhinitis     Past Surgical History: Past Surgical History:  Procedure Laterality Date   CESAREAN SECTION  02/04/2020   Procedure: CESAREAN SECTION;  Surgeon: Conard Novak, MD;  Location: [facility] ORS;  Service:  Obstetrics;;    Allergies: Allergies as of 04/08/2023   (No Known Allergies)    Medications:  Current Outpatient Medications:    buPROPion (WELLBUTRIN XL) 300 MG 24 hr tablet, TAKE 1 TABLET BY MOUTH DAILY, Disp: 90 tablet, Rfl: 1   celecoxib (CELEBREX) 100 MG capsule, Take 1 capsule (100 mg total) by mouth daily., Disp: 30 capsule, Rfl: 2   escitalopram (LEXAPRO) 20 MG tablet, TAKE 1 TABLET BY MOUTH DAILY, Disp: 90 tablet, Rfl: 1   levonorgestrel (MIRENA) 20 MCG/24HR IUD, 1 Intra Uterine Device (1 each total) by Intrauterine route once for 1 dose., Disp: 1 each, Rfl: 0   lidocaine (XYLOCAINE) 2 % solution, Use as directed 10 mLs in the mouth or throat every 4 (four) hours as needed for mouth pain., Disp: 100 mL, Rfl: 1   methylPREDNISolone (MEDROL DOSEPAK) 4 MG TBPK tablet, Use as directed, Disp: 21 tablet, Rfl: 0   omeprazole (PRILOSEC) 40 MG capsule, TAKE 1 CAPSULE BY MOUTH ONCE DAILY, Disp: 90 capsule, Rfl: 1   oxyCODONE (ROXICODONE) 5 MG immediate release tablet, Take 0.5-1 tablets (2.5-5 mg total) by mouth every 6 (six) hours as needed for severe pain (pain score 7-10)., Disp: 6 tablet, Rfl: 0  Social History: Social History   Tobacco Use   Smoking status: Never   Smokeless tobacco: Never  Vaping Use   Vaping status: Never Used  Substance Use Topics   Alcohol use: Not Currently    Alcohol/week: 0.0 standard drinks of alcohol   Drug use: No  Family Medical History: Family History  Problem Relation Age of Onset   Bipolar disorder Mother    Breast cancer Maternal Aunt 41   Breast cancer Maternal Aunt 50   Pancreatic cancer Maternal Aunt 17   Ovarian cancer Maternal Great-grandmother     Physical Examination: There were no vitals filed for this visit.  General: Patient is in no apparent distress. Attention to examination is appropriate.  Neck:   Supple.  Full range of motion.  Respiratory: Patient is breathing without any difficulty.   NEUROLOGICAL:      Awake, alert, oriented to person, place, and time.  Speech is clear and fluent.   Cranial Nerves: Pupils equal round and reactive to light.  Facial tone is symmetric.  Facial sensation is symmetric. Shoulder shrug is symmetric. Tongue protrusion is midline.    Strength: Side Biceps Triceps Deltoid Interossei Grip Wrist Ext. Wrist Flex.  R 5 5 5 5 5 5 5   L 5 5 5 5 5 5 5    Side Iliopsoas Quads Hamstring PF DF EHL  R 5 5 5 5 5 5   L 5 5 5 5 5 5    Reflexes are ***2+ and symmetric at the biceps, triceps, brachioradialis, patella and achilles.   Hoffman's is absent. Clonus is absent  Bilateral upper and lower extremity sensation is intact to light touch ***.     No evidence of dysmetria noted.  Gait is normal.    Imaging: *** I have personally reviewed the images and agree with the above interpretation.  Medical Decision Making/Assessment and Plan: [name] is a pleasant 38 y.o. female with ***  There are no diagnoses linked to this encounter.   Thank you for involving me in the care of this patient.    Lovenia Kim MD/MSCR Neurosurgery

## 2023-04-08 ENCOUNTER — Ambulatory Visit (INDEPENDENT_AMBULATORY_CARE_PROVIDER_SITE_OTHER): Payer: BC Managed Care – PPO | Admitting: Neurosurgery

## 2023-04-08 VITALS — BP 130/80 | Ht 70.0 in | Wt 266.8 lb

## 2023-04-08 DIAGNOSIS — R930 Abnormal findings on diagnostic imaging of skull and head, not elsewhere classified: Secondary | ICD-10-CM

## 2023-04-22 ENCOUNTER — Encounter: Payer: Self-pay | Admitting: Family Medicine

## 2023-04-22 ENCOUNTER — Ambulatory Visit (INDEPENDENT_AMBULATORY_CARE_PROVIDER_SITE_OTHER): Payer: BC Managed Care – PPO | Admitting: Family Medicine

## 2023-04-22 VITALS — BP 130/84 | HR 94 | Temp 98.0°F | Resp 16 | Ht 70.0 in | Wt 262.1 lb

## 2023-04-22 DIAGNOSIS — J029 Acute pharyngitis, unspecified: Secondary | ICD-10-CM

## 2023-04-22 LAB — POCT RAPID STREP A (OFFICE): Rapid Strep A Screen: NEGATIVE

## 2023-04-22 MED ORDER — PENICILLIN V POTASSIUM 500 MG PO TABS: 500.0000 mg | ORAL_TABLET | Freq: Two times a day (BID) | ORAL | 0 refills | Status: AC

## 2023-04-22 MED ORDER — LIDOCAINE VISCOUS HCL 2 % MT SOLN: 5.0000 mL | Freq: Four times a day (QID) | OROMUCOSAL | 0 refills | Status: DC | PRN

## 2023-04-22 NOTE — Progress Notes (Addendum)
 Name: Anna Whitehead   MRN: [id number]    DOB: [birth year]   Date:04/22/2023       Progress Note  Subjective  Chief Complaint  Chief Complaint  Patient presents with   Sore Throat    X3 days     Discussed the use of AI scribe software for clinical note transcription with the patient, who gave verbal consent to proceed.  History of Present Illness   Anna Whitehead is a 38 year old female with recurrent tonsillitis who presents with a sore throat.  She has experienced a sore throat for three days, characterized by pain during swallowing and a sensation of swelling. No runny nose, congestion, fever, chills, nausea, vomiting, or rashes. There is a decrease in appetite due to the pain while swallowing.  She has a history of recurrent tonsillitis, with the last episode occurring three months ago, and requiring emergency care. In November, she had a severe episode of tonsillitis with significant inflammation, treated with Augmentin  for ten days, which resolved the symptoms. A rapid strep test was negative, and a culture was performed at a different hospital, but the results are unavailable.  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 She travels for work once or twice a month, often coinciding with illness.        Patient Active Problem List   Diagnosis Date Noted   New daily persistent headache 06/10/2022   At risk for obstructive sleep apnea 04/13/2020   Bradycardia following surgery 02/05/2020   Abnormal MRI of head 02/04/2020   Anemia during pregnancy in second trimester 10/03/2019   Obesity 08/29/2019   Family history of breast cancer 07/18/2019   Depression, major, recurrent, moderate (HCC) 07/18/2016   B12 deficiency 07/18/2016   Vitamin D  insufficiency 07/18/2016   History of gestational hypertension 11/14/2015   Asthma, mild 10/13/2014   H/O high risk medication treatment 10/13/2014    Sciatic leg pain 10/13/2014   Overweight 10/13/2014   Anxiety 10/13/2014    Social History   Tobacco Use   Smoking status: Never   Smokeless tobacco: Never  Substance Use Topics   Alcohol use: Not Currently    Alcohol/week: 0.0 standard drinks of alcohol     Current Outpatient Medications:    buPROPion  (WELLBUTRIN  XL) 300 MG 24 hr tablet, TAKE 1 TABLET BY MOUTH DAILY, Disp: 90 tablet, Rfl: 1   escitalopram  (LEXAPRO ) 20 MG tablet, TAKE 1 TABLET BY MOUTH DAILY, Disp: 90 tablet, Rfl: 1   magic mouthwash (lidocaine , diphenhydrAMINE , alum & mag hydroxide) suspension, Swish and spit 5 mLs 4 (four) times daily as needed for mouth pain., Disp: 360 mL, Rfl: 0   omeprazole  (PRILOSEC) 40 MG capsule, TAKE 1 CAPSULE BY MOUTH ONCE DAILY, Disp: 90 capsule, Rfl: 1   penicillin  v potassium (VEETID) 500 MG tablet, Take 1 tablet (500 mg total) by mouth 2 (two) times daily for 10 days., Disp: 20 tablet, Rfl: 0   levonorgestrel  (MIRENA ) 20 MCG/24HR IUD, 1 Intra Uterine Device (1 each total) by Intrauterine route once for 1 dose., Disp: 1 each, Rfl: 0  No Known Allergies  ROS  Ten systems reviewed and is negative except as mentioned in HPI    Objective  Vitals:   04/22/23 1022  BP: 130/84  Pulse: 94  Resp: 16  Temp: 98 F (36.7 C)  TempSrc: Oral  SpO2: 96%  Weight: 262 lb 1.6 oz (118.9 kg)  Height: 5' 10 (  1.778 m)    Body mass index is 37.61 kg/m.    Physical Exam  Constitutional: Patient appears well-developed and well-nourished. Obese  No distress.  HEENT: head atraumatic, normocephalic, pupils equal and reactive to light, ears normal TM, neck supple, throat , small tonsils, she has some erythema on posterior pharynx and seems swollen Cardiovascular: Normal rate, regular rhythm and normal heart sounds.  No murmur heard. No BLE edema. Pulmonary/Chest: Effort normal and breath sounds normal. No respiratory distress. Abdominal: Soft.  There is no tenderness. Psychiatric: Patient  has a normal mood and affect. behavior is normal. Judgment and thought content normal.   Assessment and Plan    Recurrent Tonsillitis Second episode in three months, with the previous episode requiring a course of Augmentin  and two visits to the [facility]. Currently experiencing sore throat for three days, with no other symptoms. Rapid strep test negative, but due to history, a culture will be sent for confirmation. -Start Penicillin  VK for suspected strep throat. -Refer to Ear, Nose, and Throat specialist for evaluation given recurrent episodes.

## 2023-04-26 LAB — CULTURE, STREPTOCOCCUS GRP B W/SUSCEPT
MICRO NUMBER:: 16049929
SPECIMEN QUALITY:: ADEQUATE

## 2023-04-27 ENCOUNTER — Encounter: Payer: Self-pay | Admitting: Family Medicine

## 2023-04-29 DIAGNOSIS — J302 Other seasonal allergic rhinitis: Secondary | ICD-10-CM | POA: Diagnosis not present

## 2023-04-29 DIAGNOSIS — J028 Acute pharyngitis due to other specified organisms: Secondary | ICD-10-CM | POA: Diagnosis not present

## 2023-05-14 ENCOUNTER — Other Ambulatory Visit: Payer: Self-pay

## 2023-05-14 DIAGNOSIS — F32A Depression, unspecified: Secondary | ICD-10-CM

## 2023-05-22 ENCOUNTER — Ambulatory Visit (INDEPENDENT_AMBULATORY_CARE_PROVIDER_SITE_OTHER): Payer: BC Managed Care – PPO | Admitting: Family Medicine

## 2023-05-22 ENCOUNTER — Encounter: Payer: Self-pay | Admitting: Family Medicine

## 2023-05-22 ENCOUNTER — Other Ambulatory Visit (HOSPITAL_COMMUNITY)
Admission: RE | Admit: 2023-05-22 | Discharge: 2023-05-22 | Disposition: A | Discharge status: Home / Self Care | Source: Ambulatory Visit | Attending: Family Medicine | Admitting: Family Medicine

## 2023-05-22 VITALS — BP 128/76 | HR 77 | Resp 16 | Ht 70.0 in | Wt 263.6 lb

## 2023-05-22 DIAGNOSIS — Z124 Encounter for screening for malignant neoplasm of cervix: Secondary | ICD-10-CM | POA: Insufficient documentation

## 2023-05-22 DIAGNOSIS — R5383 Other fatigue: Secondary | ICD-10-CM | POA: Diagnosis not present

## 2023-05-22 DIAGNOSIS — F419 Anxiety disorder, unspecified: Secondary | ICD-10-CM | POA: Diagnosis not present

## 2023-05-22 DIAGNOSIS — Z130 Encounter for screening for diseases of the blood and blood-forming organs and certain disorders involving the immune mechanism: Secondary | ICD-10-CM

## 2023-05-22 DIAGNOSIS — Z23 Encounter for immunization: Secondary | ICD-10-CM | POA: Diagnosis not present

## 2023-05-22 DIAGNOSIS — Z803 Family history of malignant neoplasm of breast: Secondary | ICD-10-CM

## 2023-05-22 DIAGNOSIS — Z131 Encounter for screening for diabetes mellitus: Secondary | ICD-10-CM | POA: Diagnosis not present

## 2023-05-22 DIAGNOSIS — Z0000 Encounter for general adult medical examination without abnormal findings: Secondary | ICD-10-CM | POA: Diagnosis not present

## 2023-05-22 DIAGNOSIS — Z113 Encounter for screening for infections with a predominantly sexual mode of transmission: Secondary | ICD-10-CM | POA: Insufficient documentation

## 2023-05-22 DIAGNOSIS — Z13220 Encounter for screening for lipoid disorders: Secondary | ICD-10-CM

## 2023-05-22 DIAGNOSIS — F32A Depression, unspecified: Secondary | ICD-10-CM

## 2023-05-22 NOTE — Progress Notes (Signed)
 Name: Anna Whitehead   MRN: [id number]    DOB: March 03, 1986   Date:05/22/2023       Progress Note  Subjective  Chief Complaint  Chief Complaint  Patient presents with   Annual Exam    HPI  Patient presents for annual CPE.  Diet: eating out daily. Exercise: she is working at her house - remodeling   Last Eye Exam: completed Last Dental Exam: completed  Flowsheet Row Office Visit from 05/22/2023 in [facility]  AUDIT-C Score 1      Depression: Phq 9 is  positive    05/22/2023   10:19 AM 04/22/2023   10:17 AM 01/01/2022    1:04 PM 12/12/2021    3:06 PM 09/19/2021    8:58 AM  Depression screen PHQ 2/9  Decreased Interest 2 2 0 2 2  Down, Depressed, Hopeless 2 2 0 1 2  PHQ - 2 Score 4 4 0 3 4  Altered sleeping 2 2 0 3 3  Tired, decreased energy 2 2 0 3 3  Change in appetite 2 2 0 2 3  Feeling bad or failure about yourself  2 2 0 3 2  Trouble concentrating 2 2 0 3 3  Moving slowly or fidgety/restless 0 0 0 0 2  Suicidal thoughts 0 0 0 0 0  PHQ-9 Score 14 14 0 17 20  Difficult doing work/chores Very difficult Very difficult Not difficult at all Somewhat difficult Very difficult   Hypertension: BP Readings from Last 3 Encounters:  05/22/23 128/76  04/22/23 130/84  04/08/23 130/80   Obesity: Wt Readings from Last 3 Encounters:  05/22/23 263 lb 9.6 oz (119.6 kg)  04/22/23 262 lb 1.6 oz (118.9 kg)  04/08/23 266 lb 12.8 oz (121 kg)   BMI Readings from Last 3 Encounters:  05/22/23 37.82 kg/m  04/22/23 37.61 kg/m  04/08/23 38.28 kg/m     Vaccines: reviewed with the patient. She will get PCV 20 and Hepatitis A  Hep C Screening: completed STD testing and prevention (HIV/chl/gon/syphilis): today Intimate partner violence: negative screen  Sexual History : not sexually active in about one year  Menstrual History/LMP/Abnormal Bleeding: she has an IUD only spotting intermittently  Discussed importance of follow up if any post-menopausal  bleeding: not applicable  Incontinence Symptoms: negative for symptoms   Breast cancer:  - Last Mammogram: we will order today  - BRCA gene screening: she had it done before , she has multiple family members with breast , mother side   Osteoporosis Prevention : Discussed high calcium and vitamin D supplementation, weight bearing exercises Bone density :not applicable   Cervical cancer screening: performing today  Skin cancer: Discussed monitoring for atypical lesions  Colorectal cancer: N/A   Lung cancer:  Low Dose CT Chest recommended if Age 56-80 years, 20 pack-year currently smoking OR have quit w/in 15years. Patient does not qualify for screen    Advanced Care Planning: A voluntary discussion about advance care planning including the explanation and discussion of advance directives.  Discussed health care proxy and Living will, and the patient was able to identify a health care proxy as husband .  Patient does not have a living will and power of attorney of health care   Patient Active Problem List   Diagnosis Date Noted   New daily persistent headache 06/10/2022   At risk for obstructive sleep apnea 04/13/2020   Bradycardia following surgery 02/05/2020   Abnormal MRI of head 02/04/2020   Anemia  during pregnancy in second trimester 10/03/2019   Obesity 08/29/2019   Family history of breast cancer 07/18/2019   Depression, major, recurrent, moderate (HCC) 07/18/2016   B12 deficiency 07/18/2016   Vitamin D insufficiency 07/18/2016   History of gestational hypertension 11/14/2015   Asthma, mild 10/13/2014   H/O high risk medication treatment 10/13/2014   Sciatic leg pain 10/13/2014   Overweight 10/13/2014   Anxiety 10/13/2014    Past Surgical History:  Procedure Laterality Date   CESAREAN SECTION  02/04/2020   Procedure: CESAREAN SECTION;  Surgeon: Conard Novak, MD;  Location: [facility] ORS;  Service: Obstetrics;;    Family History  Problem Relation Age of Onset    Bipolar disorder Mother    Breast cancer Maternal Aunt 62   Breast cancer Maternal Aunt 50   Pancreatic cancer Maternal Aunt 47   Ovarian cancer Maternal Great-grandmother     Social History   Socioeconomic History   Marital status: Married    Spouse name: Para March   Number of children: 1   Years of education: Not on file   Highest education level: Bachelor's degree (e.g., BA, AB, BS)  Occupational History   Occupation: Runner, broadcasting/film/video  Tobacco Use   Smoking status: Never   Smokeless tobacco: Never  Vaping Use   Vaping status: Never Used  Substance and Sexual Activity   Alcohol use: Yes    Comment: occasionally   Drug use: No   Sexual activity: Yes    Partners: Male    Birth control/protection: I.U.D.    Comment: Mirena 05/03/20  Other Topics Concern   Not on file  Social History Narrative   Married, first son was born 11/13/2016      Patient is getting her Master's this summer.      Lives in [place] w/ husband.  Exercises 1-2 days/wk.   Social Drivers of Corporate investment banker Strain: Low Risk  (05/22/2023)   Overall Financial Resource Strain (CARDIA)    Difficulty of Paying Living Expenses: Not hard at all  Food Insecurity: No Food Insecurity (05/22/2023)   Hunger Vital Sign    Worried About Running Out of Food in the Last Year: Never true    Ran Out of Food in the Last Year: Never true  Transportation Needs: No Transportation Needs (05/22/2023)   PRAPARE - Administrator, Civil Service (Medical): No    Lack of Transportation (Non-Medical): No  Physical Activity: Insufficiently Active (05/22/2023)   Exercise Vital Sign    Days of Exercise per Week: 2 days    Minutes of Exercise per Session: 10 min  Stress: Stress Concern Present (05/22/2023)   [employer] of Occupational Health - Occupational Stress Questionnaire    Feeling of Stress : Very much  Social Connections: Socially Integrated (05/22/2023)   Social Connection and Isolation Panel [NHANES]     Frequency of Communication with Friends and Family: More than three times a week    Frequency of Social Gatherings with Friends and Family: Three times a week    Attends Religious Services: More than 4 times per year    Active Member of Clubs or Organizations: Yes    Attends Banker Meetings: Never    Marital Status: Married  Catering manager Violence: Not At Risk (05/22/2023)   Humiliation, Afraid, Rape, and Kick questionnaire    Fear of Current or Ex-Partner: No    Emotionally Abused: No    Physically Abused: No    Sexually Abused: No  Current Outpatient Medications:    buPROPion (WELLBUTRIN XL) 300 MG 24 hr tablet, TAKE 1 TABLET BY MOUTH DAILY, Disp: 90 tablet, Rfl: 1   omeprazole (PRILOSEC) 40 MG capsule, TAKE 1 CAPSULE BY MOUTH ONCE DAILY, Disp: 90 capsule, Rfl: 1   levonorgestrel (MIRENA) 20 MCG/24HR IUD, 1 Intra Uterine Device (1 each total) by Intrauterine route once for 1 dose., Disp: 1 each, Rfl: 0  No Known Allergies   ROS  Constitutional: Negative for fever or weight change.  Respiratory: Negative for cough and shortness of breath.   Cardiovascular: Negative for chest pain or palpitations.  Gastrointestinal: Negative for abdominal pain, no bowel changes.  Musculoskeletal: Negative for gait problem or joint swelling.  Skin: Negative for rash.  Neurological: Negative for dizziness or headache.  No other specific complaints in a complete review of systems (except as listed in HPI above).   Objective  Vitals:   05/22/23 1022  BP: 128/76  Pulse: 77  Resp: 16  SpO2: 95%  Weight: 263 lb 9.6 oz (119.6 kg)  Height: 5\' 10"  (1.778 m)    Body mass index is 37.82 kg/m.  Physical Exam  Constitutional: Patient appears well-developed and well-nourished. Obese No distress.  HENT: Head: Normocephalic and atraumatic. Ears: B TMs ok, no erythema or effusion; Nose: Nose normal. Mouth/Throat: Oropharynx is clear and moist. No oropharyngeal exudate.  Eyes:  Conjunctivae and EOM are normal. Pupils are equal, round, and reactive to light. No scleral icterus.  Neck: Normal range of motion. Neck supple. No JVD present. No thyromegaly present.  Cardiovascular: Normal rate, regular rhythm and normal heart sounds.  No murmur heard. No BLE edema. Pulmonary/Chest: Effort normal and breath sounds normal. No respiratory distress. Abdominal: Soft. Bowel sounds are normal, no distension. There is no tenderness. no masses Breast: no lumps or masses, no nipple discharge or rashes FEMALE GENITALIA:  External genitalia normal External urethra normal Vaginal vault normal without discharge or lesions Cervix normal without discharge or lesions Bimanual exam normal without masses RECTAL: not done Musculoskeletal: Normal range of motion, no joint effusions. No gross deformities Neurological: he is alert and oriented to person, place, and time. No cranial nerve deficit. Coordination, balance, strength, speech and gait are normal.  Skin: Skin is warm and dry. No rash noted. No erythema.  Psychiatric: Patient has a normal mood and affect. behavior is normal. Judgment and thought content normal.     Assessment & Plan  1. Well adult exam (Primary)  - Pneumococcal conjugate vaccine 20-valent - Cytology - PAP - Ambulatory referral to Psychiatry - Hepatitis A vaccine adult IM - HIV Antibody (routine testing w rflx) - RPR - B12 and Folate Panel - Hemoglobin A1c - COMPLETE METABOLIC PANEL WITH GFR - CBC with Differential/Platelet - Lipid panel - TSH - VITAMIN D 25 Hydroxy (Vit-D Deficiency, Fractures)  2. Anxiety and depression  - Ambulatory referral to Psychiatry - COMPLETE METABOLIC PANEL WITH GFR - CBC with Differential/Platelet - TSH  3. Cervical cancer screening  - Cytology - PAP  4. Screening examination for STI  - HIV Antibody (routine testing w rflx) - RPR  5. Immunization due  - Pneumococcal conjugate vaccine 20-valent  6. Lipid  screening  Lipid panel   7. Diabetes mellitus screening  A1C  8. Screening for deficiency anemia  CBC  9. Fatigue, unspecified type  - B12 and Folate Panel - COMPLETE METABOLIC PANEL WITH GFR - CBC with Differential/Platelet - TSH - VITAMIN D 25 Hydroxy (Vit-D Deficiency, Fractures)  10.  Need for hepatitis A vaccination  - Hepatitis A vaccine adult IM  11. Family history of breast cancer  - MM 3D SCREENING MAMMOGRAM BILATERAL BREAST; Future    -USPSTF grade A and B recommendations reviewed with patient; age-appropriate recommendations, preventive care, screening tests, etc discussed and encouraged; healthy living encouraged; see AVS for patient education given to patient -Discussed importance of 150 minutes of physical activity weekly, eat two servings of fish weekly, eat one serving of tree nuts ( cashews, pistachios, pecans, almonds.Marland Kitchen) every other day, eat 6 servings of fruit/vegetables daily and drink plenty of water and avoid sweet beverages.   -Reviewed Health Maintenance: Yes.

## 2023-05-23 LAB — COMPLETE METABOLIC PANEL WITH GFR
AG Ratio: 1.8 (calc) (ref 1.0–2.5)
ALT: 12 U/L (ref 6–29)
AST: 15 U/L (ref 10–30)
Albumin: 4.8 g/dL (ref 3.6–5.1)
Alkaline phosphatase (APISO): 74 U/L (ref 31–125)
BUN: 10 mg/dL (ref 7–25)
CO2: 29 mmol/L (ref 20–32)
Calcium: 9.5 mg/dL (ref 8.6–10.2)
Chloride: 101 mmol/L (ref 98–110)
Creat: 0.82 mg/dL (ref 0.50–0.97)
Globulin: 2.7 g/dL (ref 1.9–3.7)
Glucose, Bld: 93 mg/dL (ref 65–99)
Potassium: 4.1 mmol/L (ref 3.5–5.3)
Sodium: 140 mmol/L (ref 135–146)
Total Bilirubin: 0.3 mg/dL (ref 0.2–1.2)
Total Protein: 7.5 g/dL (ref 6.1–8.1)
eGFR: 96 mL/min/{1.73_m2} (ref >=60)

## 2023-05-23 LAB — CBC WITH DIFFERENTIAL/PLATELET
Absolute Lymphocytes: 1502 {cells}/uL (ref 850–3900)
Absolute Monocytes: 319 {cells}/uL (ref 200–950)
Basophils Absolute: 33 {cells}/uL (ref 0–200)
Basophils Relative: 0.5 %
Eosinophils Absolute: 338 {cells}/uL (ref 15–500)
Eosinophils Relative: 5.2 %
HCT: 42.7 % (ref 35.0–45.0)
Hemoglobin: 13.8 g/dL (ref 11.7–15.5)
MCH: 28.2 pg (ref 27.0–33.0)
MCHC: 32.3 g/dL (ref 32.0–36.0)
MCV: 87.1 fL (ref 80.0–100.0)
MPV: 10.5 fL (ref 7.5–12.5)
Monocytes Relative: 4.9 %
Neutro Abs: 4310 {cells}/uL (ref 1500–7800)
Neutrophils Relative %: 66.3 %
Platelets: 298 10*3/uL (ref 140–400)
RBC: 4.9 10*6/uL (ref 3.80–5.10)
RDW: 13.7 % (ref 11.0–15.0)
Total Lymphocyte: 23.1 %
WBC: 6.5 10*3/uL (ref 3.8–10.8)

## 2023-05-23 LAB — TSH: TSH: 1.74 m[IU]/L

## 2023-05-23 LAB — HEMOGLOBIN A1C
Hgb A1c MFr Bld: 5.6 %{Hb} (ref <5.7)
Mean Plasma Glucose: 114 mg/dL
eAG (mmol/L): 6.3 mmol/L

## 2023-05-23 LAB — RPR: RPR Ser Ql: NONREACTIVE

## 2023-05-23 LAB — B12 AND FOLATE PANEL
Folate: 10.5 ng/mL
Vitamin B-12: 322 pg/mL (ref 200–1100)

## 2023-05-23 LAB — LIPID PANEL
Cholesterol: 226 mg/dL — ABNORMAL HIGH (ref <200)
HDL: 51 mg/dL (ref >=50)
LDL Cholesterol (Calc): 140 mg/dL — ABNORMAL HIGH
Non-HDL Cholesterol (Calc): 175 mg/dL — ABNORMAL HIGH (ref <130)
Total CHOL/HDL Ratio: 4.4 (calc) (ref <5.0)
Triglycerides: 210 mg/dL — ABNORMAL HIGH (ref <150)

## 2023-05-23 LAB — HIV ANTIBODY (ROUTINE TESTING W REFLEX): HIV 1&2 Ab, 4th Generation: NONREACTIVE

## 2023-05-23 LAB — VITAMIN D 25 HYDROXY (VIT D DEFICIENCY, FRACTURES): Vit D, 25-Hydroxy: 25 ng/mL — ABNORMAL LOW (ref 30–100)

## 2023-05-25 ENCOUNTER — Encounter: Payer: Self-pay | Admitting: Family Medicine

## 2023-05-26 ENCOUNTER — Encounter: Payer: Self-pay | Admitting: Family Medicine

## 2023-05-26 ENCOUNTER — Other Ambulatory Visit: Payer: Self-pay | Admitting: Family Medicine

## 2023-05-26 MED ORDER — FLUCONAZOLE 150 MG PO TABS: 150.0000 mg | ORAL_TABLET | ORAL | 0 refills | Status: DC

## 2023-05-27 ENCOUNTER — Encounter: Payer: Self-pay | Admitting: Family Medicine

## 2023-05-27 LAB — CYTOLOGY - PAP
Chlamydia: NEGATIVE
Comment: NEGATIVE
Comment: NEGATIVE
Comment: NEGATIVE
Comment: NORMAL
Diagnosis: NEGATIVE
High risk HPV: NEGATIVE
Neisseria Gonorrhea: NEGATIVE
Trichomonas: NEGATIVE

## 2023-05-28 NOTE — Progress Notes (Unsigned)
   There were no vitals taken for this visit.   Subjective:    Patient ID: Anna Whitehead, female    DOB: 1986/03/07, 38 y.o.   MRN: [id number]  HPI: Anna Whitehead is a 38 y.o. female  No chief complaint on file.   Discussed the use of AI scribe software for clinical note transcription with the patient, who gave verbal consent to proceed.  History of Present Illness           05/22/2023   10:19 AM 04/22/2023   10:17 AM 01/01/2022    1:04 PM  Depression screen PHQ 2/9  Decreased Interest 2 2 0  Down, Depressed, Hopeless 2 2 0  PHQ - 2 Score 4 4 0  Altered sleeping 2 2 0  Tired, decreased energy 2 2 0  Change in appetite 2 2 0  Feeling bad or failure about yourself  2 2 0  Trouble concentrating 2 2 0  Moving slowly or fidgety/restless 0 0 0  Suicidal thoughts 0 0 0  PHQ-9 Score 14 14 0  Difficult doing work/chores Very difficult Very difficult Not difficult at all    Relevant past medical, surgical, family and social history reviewed and updated as indicated. Interim medical history since our last visit reviewed. Allergies and medications reviewed and updated.  Review of Systems  Per HPI unless specifically indicated above     Objective:    There were no vitals taken for this visit.  {Vitals History (Optional):23777} Wt Readings from Last 3 Encounters:  05/22/23 263 lb 9.6 oz (119.6 kg)  04/22/23 262 lb 1.6 oz (118.9 kg)  04/08/23 266 lb 12.8 oz (121 kg)    Physical Exam  Results for orders placed or performed in visit on 05/22/23  [facility] HEPATITIS C SCREENING LAB   Collection Time: 02/03/23 12:00 AM  Result Value Ref Range   [facility] Hepatitis Screen Negative-Validated    {Labs (Optional):23779}    Assessment & Plan:   Problem List Items Addressed This Visit   None    Assessment and Plan             Follow up plan: No follow-ups on file.

## 2023-05-29 ENCOUNTER — Ambulatory Visit (INDEPENDENT_AMBULATORY_CARE_PROVIDER_SITE_OTHER): Admitting: Nurse Practitioner

## 2023-05-29 ENCOUNTER — Encounter: Payer: Self-pay | Admitting: Family Medicine

## 2023-05-29 ENCOUNTER — Telehealth: Payer: Self-pay | Admitting: Family Medicine

## 2023-05-29 ENCOUNTER — Encounter: Payer: Self-pay | Admitting: Nurse Practitioner

## 2023-05-29 VITALS — BP 126/82 | HR 78 | Temp 97.8°F | Resp 16 | Ht 70.0 in | Wt 269.7 lb

## 2023-05-29 DIAGNOSIS — Z6838 Body mass index (BMI) 38.0-38.9, adult: Secondary | ICD-10-CM | POA: Diagnosis not present

## 2023-05-29 DIAGNOSIS — R41840 Attention and concentration deficit: Secondary | ICD-10-CM

## 2023-05-29 DIAGNOSIS — E66812 Obesity, class 2: Secondary | ICD-10-CM

## 2023-05-29 MED ORDER — ZEPBOUND 2.5 MG/0.5ML ~~LOC~~ SOAJ: 2.5000 mg | SUBCUTANEOUS | 0 refills | Status: DC

## 2023-05-29 NOTE — Telephone Encounter (Signed)
 Prior Authorization from [facility]  tirzepatide [facility]) 2.5 MG/0.5ML Pen    Key: [id number]

## 2023-05-29 NOTE — Telephone Encounter (Signed)
 Tried PA, PA response:  Prior Authorization Not Available - This product is excluded from this benefit plan.

## 2023-06-14 DIAGNOSIS — R1032 Left lower quadrant pain: Secondary | ICD-10-CM | POA: Diagnosis not present

## 2023-06-16 ENCOUNTER — Other Ambulatory Visit: Payer: Self-pay

## 2023-06-25 DIAGNOSIS — Z79899 Other long term (current) drug therapy: Secondary | ICD-10-CM | POA: Diagnosis not present

## 2023-06-25 DIAGNOSIS — R41840 Attention and concentration deficit: Secondary | ICD-10-CM | POA: Diagnosis not present

## 2023-07-13 ENCOUNTER — Ambulatory Visit (LOCAL_COMMUNITY_HEALTH_CENTER): Payer: Self-pay

## 2023-07-13 DIAGNOSIS — R41840 Attention and concentration deficit: Secondary | ICD-10-CM | POA: Diagnosis not present

## 2023-07-13 DIAGNOSIS — Z111 Encounter for screening for respiratory tuberculosis: Secondary | ICD-10-CM

## 2023-07-13 DIAGNOSIS — Z79899 Other long term (current) drug therapy: Secondary | ICD-10-CM | POA: Diagnosis not present

## 2023-07-15 ENCOUNTER — Other Ambulatory Visit: Payer: Self-pay

## 2023-07-15 ENCOUNTER — Ambulatory Visit: Payer: Self-pay

## 2023-07-15 DIAGNOSIS — Z111 Encounter for screening for respiratory tuberculosis: Secondary | ICD-10-CM

## 2023-07-15 LAB — TB SKIN TEST
Induration: 0 mm
TB Skin Test: NEGATIVE

## 2023-07-16 ENCOUNTER — Encounter: Payer: Self-pay | Admitting: Oncology

## 2023-07-18 ENCOUNTER — Encounter: Payer: Self-pay | Admitting: Family Medicine

## 2023-08-13 ENCOUNTER — Encounter: Payer: Self-pay | Admitting: Family Medicine

## 2023-08-17 ENCOUNTER — Encounter: Payer: Self-pay | Admitting: Oncology

## 2023-08-18 ENCOUNTER — Ambulatory Visit: Admitting: Family Medicine

## 2023-08-18 ENCOUNTER — Encounter: Payer: Self-pay | Admitting: Family Medicine

## 2023-08-18 VITALS — BP 102/64 | HR 73 | Resp 16 | Ht 70.0 in | Wt 262.6 lb

## 2023-08-18 DIAGNOSIS — R400 Somnolence: Secondary | ICD-10-CM

## 2023-08-18 DIAGNOSIS — R0683 Snoring: Secondary | ICD-10-CM

## 2023-08-18 DIAGNOSIS — F900 Attention-deficit hyperactivity disorder, predominantly inattentive type: Secondary | ICD-10-CM | POA: Diagnosis not present

## 2023-08-18 DIAGNOSIS — E669 Obesity, unspecified: Secondary | ICD-10-CM

## 2023-08-18 MED ORDER — SEMAGLUTIDE-WEIGHT MANAGEMENT 0.25 MG/0.5ML ~~LOC~~ SOAJ: 0.2500 mg | SUBCUTANEOUS | 0 refills | Status: AC

## 2023-08-18 MED ORDER — SEMAGLUTIDE-WEIGHT MANAGEMENT 0.5 MG/0.5ML ~~LOC~~ SOAJ: 0.5000 mg | SUBCUTANEOUS | 0 refills | Status: AC

## 2023-08-18 MED ORDER — LISDEXAMFETAMINE DIMESYLATE 40 MG PO CAPS: 40.0000 mg | ORAL_CAPSULE | ORAL | 0 refills | Status: DC

## 2023-08-18 NOTE — Progress Notes (Signed)
 Name: Anna Whitehead   MRN: [id number]    DOB: 04-17-1985   Date:08/18/2023       Progress Note  Subjective  Chief Complaint  Chief Complaint  Patient presents with   Medical Management of Chronic Issues    Pt would like for PCP to take over vyvanse due to new insurance unable to get it from previous provider   HPI   ADHD newly diagnosed by [facility] April 2025. She is  currently taking 40 mg of Vyvanse and denies side effects except for decrease in appetite , sometimes hot sensation. It does not affect her sleep . She states medication is helping with attention, focus, memory, organizational skills. Daytime somnolence is still present. ESS of 7 but she also snores and has obesity and headaches. She states post delivery of her second son her heart rate dropped and was sent home with a cardiac monitor and advised to have a sleep study at the time but never get the study done.   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 She stopped taking wellbutrin . Discussed other SSRI but she wants to hold off for now   Morbid obesity , her BMI is over 35 , she states since birth of her children she has not been able to lose her weight. She is currently taking Vyvanse that helps curb her appetite. She has a gym at her house and has been working out once a week, discussed smaller goals. Meal prepping also cooking more at home. Weight is down from 269 lbs to 262 lbs today. She has not tried medications in the past but has lost up to 50 lbs with life style changes before she got pregnant. Her highest weight was close to 275 lbs, lowest weight as an adult was 160 lbs   Patient Active Problem List   Diagnosis Date Noted   New daily persistent headache 06/10/2022   At risk for obstructive sleep apnea 04/13/2020   Bradycardia following surgery 02/05/2020    Abnormal MRI of head 02/04/2020   Anemia during pregnancy in second trimester 10/03/2019   Obesity 08/29/2019   Family history of breast cancer 07/18/2019   Depression, major, recurrent, moderate (HCC) 07/18/2016   B12 deficiency 07/18/2016   Vitamin D  insufficiency 07/18/2016   History of gestational hypertension 11/14/2015   Asthma, mild 10/13/2014   Anxiety 10/13/2014    Past Surgical History:  Procedure Laterality Date   CESAREAN SECTION  02/04/2020   Procedure: CESAREAN SECTION;  Surgeon: Kris Pester, MD;  Location: [facility] ORS;  Service: Obstetrics;;    Family History  Problem Relation Age of Onset   Bipolar disorder Mother    Breast cancer Maternal Aunt 40   Breast cancer Maternal Aunt 50   Pancreatic cancer Maternal Aunt 69   Ovarian cancer Maternal Great-grandmother     Social History   Tobacco Use   Smoking status: Never   Smokeless tobacco: Never  Substance Use Topics   Alcohol use: Yes    Comment: occasionally     Current Outpatient Medications:    Lisdexamfetamine Dimesylate 20 MG CHEW, Chew 20 mg by mouth daily., Disp: , Rfl:    omeprazole  (PRILOSEC) 40 MG capsule, TAKE 1 CAPSULE BY MOUTH ONCE DAILY, Disp: 90 capsule, Rfl: 1   buPROPion  (WELLBUTRIN  XL) 300 MG 24 hr tablet, Take 300 mg by mouth  daily. (Patient not taking: Reported on 08/18/2023), Disp: , Rfl:    fluconazole  (DIFLUCAN ) 150 MG tablet, Take 1 tablet (150 mg total) by mouth every other day. (Patient not taking: Reported on 08/18/2023), Disp: 3 tablet, Rfl: 0   levonorgestrel  (MIRENA ) 20 MCG/24HR IUD, 1 Intra Uterine Device (1 each total) by Intrauterine route once for 1 dose., Disp: 1 each, Rfl: 0   tirzepatide  (ZEPBOUND ) 2.5 MG/0.5ML Pen, Inject 2.5 mg into the skin once a week. (Patient not taking: Reported on 08/18/2023), Disp: 2 mL, Rfl: 0  No Known Allergies  I personally reviewed active problem list, medication list, allergies with the patient/caregiver today.   ROS  Ten systems  reviewed and is negative except as mentioned in HPI    Objective Physical Exam Constitutional: Patient appears well-developed and well-nourished. Obese  No distress.  HEENT: head atraumatic, normocephalic, pupils equal and reactive to light, neck supple Cardiovascular: Normal rate, regular rhythm and normal heart sounds.  No murmur heard. No BLE edema. Pulmonary/Chest: Effort normal and breath sounds normal. No respiratory distress. Abdominal: Soft.  There is no tenderness. Psychiatric: Patient has a normal mood and affect. behavior is normal. Judgment and thought content normal.   Vitals:   08/18/23 1355  BP: 102/64  Pulse: 73  Resp: 16  SpO2: 98%  Weight: 262 lb 9.6 oz (119.1 kg)  Height: 5\' 10"  (1.778 m)    Body mass index is 37.68 kg/m.  Recent Results (from the past 2160 hours)  Cytology - PAP     Status: None   Collection Time: 05/22/23 10:47 AM  Result Value Ref Range   High risk HPV Negative    Neisseria Gonorrhea Negative    Chlamydia Negative    Trichomonas Negative    Adequacy      Satisfactory for evaluation; transformation zone component PRESENT.   Diagnosis      - Negative for intraepithelial lesion or malignancy (NILM)   Comment Normal Reference Range HPV - Negative    Comment Normal Reference Range Trichomonas - Negative    Comment Normal Reference Ranger Chlamydia - Negative    Comment      Normal Reference Range Neisseria Gonorrhea - Negative  HIV Antibody (routine testing w rflx)     Status: None   Collection Time: 05/22/23 11:10 AM  Result Value Ref Range   HIV 1&2 Ab, 4th Generation NON-REACTIVE NON-REACTIVE    Comment: HIV-1 antigen and HIV-1/HIV-2 antibodies were not detected. There is no laboratory evidence of HIV infection. Aaron Aas PLEASE NOTE: This information has been disclosed to you from records whose confidentiality may be protected by state law.  If your state requires such protection, then the state law prohibits you from making any  further disclosure of the information without the specific written consent of the person to whom it pertains, or as otherwise permitted by law. A general authorization for the release of medical or other information is NOT sufficient for this purpose. . For additional information please refer to http://education.questdiagn[facility].com/faq/FAQ106 (This link is being provided for informational/ educational purposes only.) . Aaron Aas The performance of this assay has not been clinically validated in patients less than 74 years old. .   RPR     Status: None   Collection Time: 05/22/23 11:10 AM  Result Value Ref Range   RPR Ser Ql NON-REACTIVE NON-REACTIVE    Comment: . No laboratory evidence of syphilis. If recent exposure is suspected, submit a new sample in 2-4 weeks. .   B12 and  Folate Panel     Status: None   Collection Time: 05/22/23 11:10 AM  Result Value Ref Range   Vitamin B-12 322 200 - 1,100 pg/mL    Comment: . Please Note: Although the reference range for vitamin B12 is [phone] pg/mL, it has been reported that between 5 and 10% of patients with values between 200 and 400 pg/mL may experience neuropsychiatric and hematologic abnormalities due to occult B12 deficiency; less than 1% of patients with values above 400 pg/mL will have symptoms. .    Folate 10.5 ng/mL    Comment:                            Reference Range                            Low:           <3.4                            Borderline:    3.4-5.4                            Normal:        >5.4 .   Hemoglobin A1c     Status: None   Collection Time: 05/22/23 11:10 AM  Result Value Ref Range   Hgb A1c MFr Bld 5.6 <5.7 % of total Hgb    Comment: For the purpose of screening for the presence of diabetes: . <5.7%       Consistent with the absence of diabetes 5.7-6.4%    Consistent with increased risk for diabetes             (prediabetes) > or =6.5%  Consistent with diabetes . This assay result is  consistent with a decreased risk of diabetes. . Currently, no consensus exists regarding use of hemoglobin A1c for diagnosis of diabetes in children. . According to American Diabetes Association (ADA) guidelines, hemoglobin A1c <7.0% represents optimal control in non-pregnant diabetic patients. Different metrics may apply to specific patient populations.  Standards of Medical Care in Diabetes(ADA). .    Mean Plasma Glucose 114 mg/dL   eAG (mmol/L) 6.3 mmol/L  COMPLETE METABOLIC PANEL WITH GFR     Status: None   Collection Time: 05/22/23 11:10 AM  Result Value Ref Range   Glucose, Bld 93 65 - 99 mg/dL    Comment: .            Fasting reference interval .    BUN 10 7 - 25 mg/dL   Creat 1.30 8.65 - 7.84 mg/dL   eGFR 96 > OR = 60 ON/GEX/5.28U1   BUN/Creatinine Ratio SEE NOTE: 6 - 22 (calc)    Comment:    Not Reported: BUN and Creatinine are within    reference range. .    Sodium 140 135 - 146 mmol/L   Potassium 4.1 3.5 - 5.3 mmol/L   Chloride 101 98 - 110 mmol/L   CO2 29 20 - 32 mmol/L   Calcium 9.5 8.6 - 10.2 mg/dL   Total Protein 7.5 6.1 - 8.1 g/dL   Albumin 4.8 3.6 - 5.1 g/dL   Globulin 2.7 1.9 - 3.7 g/dL (calc)   AG Ratio 1.8 1.0 - 2.5 (calc)   Total Bilirubin 0.3 0.2 - 1.2 mg/dL  Alkaline phosphatase (APISO) 74 31 - 125 U/L   AST 15 10 - 30 U/L   ALT 12 6 - 29 U/L  CBC with Differential/Platelet     Status: None   Collection Time: 05/22/23 11:10 AM  Result Value Ref Range   WBC 6.5 3.8 - 10.8 Thousand/uL   RBC 4.90 3.80 - 5.10 Million/uL   Hemoglobin 13.8 11.7 - 15.5 g/dL   HCT 56.2 13.0 - 86.5 %   MCV 87.1 80.0 - 100.0 fL   MCH 28.2 27.0 - 33.0 pg   MCHC 32.3 32.0 - 36.0 g/dL    Comment: For adults, a slight decrease in the calculated MCHC value (in the range of 30 to 32 g/dL) is most likely not clinically significant; however, it should be interpreted with caution in correlation with other red cell parameters and the patient's clinical condition.     RDW 13.7 11.0 - 15.0 %   Platelets 298 140 - 400 Thousand/uL   MPV 10.5 7.5 - 12.5 fL   Neutro Abs 4,310 1,500 - 7,800 cells/uL   Absolute Lymphocytes 1,502 850 - 3,900 cells/uL   Absolute Monocytes 319 200 - 950 cells/uL   Eosinophils Absolute 338 15 - 500 cells/uL   Basophils Absolute 33 0 - 200 cells/uL   Neutrophils Relative % 66.3 %   Total Lymphocyte 23.1 %   Monocytes Relative 4.9 %   Eosinophils Relative 5.2 %   Basophils Relative 0.5 %  Lipid panel     Status: Abnormal   Collection Time: 05/22/23 11:10 AM  Result Value Ref Range   Cholesterol 226 (H) <200 mg/dL   HDL 51 > OR = 50 mg/dL   Triglycerides 784 (H) <150 mg/dL    Comment: . If a non-fasting specimen was collected, consider repeat triglyceride testing on a fasting specimen if clinically indicated.  Imagene Mam et al. J. of Clin. Lipidol. 2015;9:129-169. Aaron Aas    LDL Cholesterol (Calc) 140 (H) mg/dL (calc)    Comment: Reference range: <100 . Desirable range <100 mg/dL for primary prevention;   <70 mg/dL for patients with CHD or diabetic patients  with > or = 2 CHD risk factors. Aaron Aas LDL-C is now calculated using the Martin-Hopkins  calculation, which is a validated novel method providing  better accuracy than the Friedewald equation in the  estimation of LDL-C.  Melinda Sprawls et al. Erroll Heard. 6962;952(84): 2061-2068  (http://education.[facility].com/faq/FAQ164)    Total CHOL/HDL Ratio 4.4 <5.0 (calc)   Non-HDL Cholesterol (Calc) 175 (H) <130 mg/dL (calc)    Comment: For patients with diabetes plus 1 major ASCVD risk  factor, treating to a non-HDL-C goal of <100 mg/dL  (LDL-C of <13 mg/dL) is considered a therapeutic  option.   TSH     Status: None   Collection Time: 05/22/23 11:10 AM  Result Value Ref Range   TSH 1.74 mIU/L    Comment:           Reference Range .           > or = 20 Years  0.40-4.50 .                Pregnancy Ranges           First trimester    0.26-2.66           Second trimester    0.55-2.73           Third trimester    0.43-2.91   VITAMIN D  25 Hydroxy (Vit-D Deficiency, Fractures)  Status: Abnormal   Collection Time: 05/22/23 11:10 AM  Result Value Ref Range   Vit D, 25-Hydroxy 25 (L) 30 - 100 ng/mL    Comment: Vitamin D  Status         25-OH Vitamin D : . Deficiency:                    <20 ng/mL Insufficiency:             20 - 29 ng/mL Optimal:                 > or = 30 ng/mL . For 25-OH Vitamin D  testing on patients on  D2-supplementation and patients for whom quantitation  of D2 and D3 fractions is required, the QuestAssureD(TM) 25-OH VIT D, (D2,D3), LC/MS/MS is recommended: order  code 16109 (patients >84yrs). . See Note 1 . Note 1 . For additional information, please refer to  http://education.[facility].com/faq/FAQ199  (This link is being provided for informational/ educational purposes only.)   TB Skin Test     Status: Normal   Collection Time: 07/15/23 10:08 AM  Result Value Ref Range   TB Skin Test Negative    Induration 0 mm      PHQ2/9:    08/18/2023    1:53 PM 05/22/2023   10:19 AM 04/22/2023   10:17 AM 01/01/2022    1:04 PM 12/12/2021    3:06 PM  Depression screen PHQ 2/9  Decreased Interest 1 2 2  0 2  Down, Depressed, Hopeless 1 2 2  0 1  PHQ - 2 Score 2 4 4  0 3  Altered sleeping 1 2 2  0 3  Tired, decreased energy 1 2 2  0 3  Change in appetite 1 2 2  0 2  Feeling bad or failure about yourself  1 2 2  0 3  Trouble concentrating 1 2 2  0 3  Moving slowly or fidgety/restless 0 0 0 0 0  Suicidal thoughts 0 0 0 0 0  PHQ-9 Score 7 14 14  0 17  Difficult doing work/chores Somewhat difficult Very difficult Very difficult Not difficult at all Somewhat difficult    phq 9 is positive  Fall Risk:    08/18/2023    1:49 PM 04/22/2023   10:17 AM 01/01/2022    1:04 PM 12/12/2021    3:05 PM 04/13/2019    3:41 PM  Fall Risk   Falls in the past year? 0 0 0 0 0  Number falls in past yr: 0 0 0 0 0  Injury with Fall? 0 0 0 0 0  Risk for  fall due to : No Fall Risks No Fall Risks     Follow up Falls prevention discussed;Education provided;Falls evaluation completed Falls prevention discussed;Education provided;Falls evaluation completed        Assessment & Plan  1. Attention deficit hyperactivity disorder (ADHD), predominantly inattentive type (Primary)  - lisdexamfetamine (VYVANSE) 40 MG capsule; Take 1 capsule (40 mg total) by mouth every morning.  Dispense: 90 capsule; Refill: 0  2. Snoring  - Ambulatory referral to Sleep Studies  3. Daytime somnolence  - Ambulatory referral to Sleep Studies  4. Obesity (BMI 30-39.9)  - Semaglutide -Weight Management 0.25 MG/0.5ML SOAJ; Inject 0.25 mg into the skin once a week for 28 days.  Dispense: 2 mL; Refill: 0 - Semaglutide -Weight Management 0.5 MG/0.5ML SOAJ; Inject 0.5 mg into the skin once a week for 28 days.  Dispense: 2 mL; Refill: 0

## 2023-08-19 ENCOUNTER — Other Ambulatory Visit (HOSPITAL_COMMUNITY): Payer: Self-pay

## 2023-08-19 ENCOUNTER — Telehealth: Payer: Self-pay | Admitting: Pharmacy Technician

## 2023-08-19 ENCOUNTER — Encounter: Payer: Self-pay | Admitting: Oncology

## 2023-08-19 NOTE — Telephone Encounter (Signed)
 Pharmacy Patient Advocate Encounter  Received notification from [facility] MEDICAID that Prior Authorization for Wegovy  0.25MG /0.5ML auto-injectors  has been DENIED.  Full denial letter will be uploaded to the media tab. See denial reason below.      PA #/Case ID/Reference #: [id number]

## 2023-08-19 NOTE — Telephone Encounter (Signed)
 Pharmacy Patient Advocate Encounter   Received notification from CoverMyMeds that prior authorization for Wegovy  0.25MG /0.5ML auto-injectors is required/requested.   Insurance verification completed.   The patient is insured through [facility] MEDICAID .   Per test claim: PA required; PA submitted to above mentioned insurance via CoverMyMeds Key/confirmation #/EOC [id number] Status is pending

## 2023-08-20 ENCOUNTER — Other Ambulatory Visit (HOSPITAL_COMMUNITY): Payer: Self-pay

## 2023-10-12 ENCOUNTER — Other Ambulatory Visit: Payer: Self-pay | Admitting: Family Medicine

## 2023-10-12 DIAGNOSIS — F900 Attention-deficit hyperactivity disorder, predominantly inattentive type: Secondary | ICD-10-CM

## 2023-10-13 ENCOUNTER — Other Ambulatory Visit: Payer: Self-pay | Admitting: Family Medicine

## 2023-10-13 MED ORDER — LISDEXAMFETAMINE DIMESYLATE 40 MG PO CAPS: 40.0000 mg | ORAL_CAPSULE | ORAL | 0 refills | Status: DC

## 2023-11-18 ENCOUNTER — Ambulatory Visit: Admitting: Family Medicine

## 2023-12-02 ENCOUNTER — Encounter: Payer: Self-pay | Admitting: Oncology

## 2023-12-02 ENCOUNTER — Ambulatory Visit: Admitting: Family Medicine

## 2023-12-02 ENCOUNTER — Encounter: Payer: Self-pay | Admitting: Family Medicine

## 2023-12-02 VITALS — BP 110/70 | HR 81 | Resp 16 | Ht 70.0 in | Wt 251.6 lb

## 2023-12-02 DIAGNOSIS — Z23 Encounter for immunization: Secondary | ICD-10-CM | POA: Diagnosis not present

## 2023-12-02 DIAGNOSIS — K219 Gastro-esophageal reflux disease without esophagitis: Secondary | ICD-10-CM | POA: Diagnosis not present

## 2023-12-02 DIAGNOSIS — E669 Obesity, unspecified: Secondary | ICD-10-CM

## 2023-12-02 DIAGNOSIS — F900 Attention-deficit hyperactivity disorder, predominantly inattentive type: Secondary | ICD-10-CM

## 2023-12-02 DIAGNOSIS — F419 Anxiety disorder, unspecified: Secondary | ICD-10-CM

## 2023-12-02 DIAGNOSIS — F32A Depression, unspecified: Secondary | ICD-10-CM

## 2023-12-02 MED ORDER — TIRZEPATIDE-WEIGHT MANAGEMENT 2.5 MG/0.5ML ~~LOC~~ SOAJ: 2.5000 mg | SUBCUTANEOUS | 0 refills | Status: DC

## 2023-12-02 MED ORDER — AIRSUPRA 90-80 MCG/ACT IN AERO: 2.0000 | INHALATION_SPRAY | Freq: Four times a day (QID) | RESPIRATORY_TRACT | 1 refills | Status: AC

## 2023-12-02 MED ORDER — LISDEXAMFETAMINE DIMESYLATE 40 MG PO CAPS: 40.0000 mg | ORAL_CAPSULE | ORAL | 0 refills | Status: DC

## 2023-12-02 MED ORDER — OMEPRAZOLE 40 MG PO CPDR: 40.0000 mg | DELAYED_RELEASE_CAPSULE | Freq: Every day | ORAL | 1 refills | Status: DC

## 2023-12-02 NOTE — Progress Notes (Signed)
 Name: Anna Whitehead   MRN: [id number]    DOB: [birth year]   Date:12/02/2023       Progress Note  Subjective  Chief Complaint  Chief Complaint  Patient presents with   Medical Management of Chronic Issues   Discussed the use of AI scribe software for clinical note transcription with the patient, who gave verbal consent to proceed.  History of Present Illness Anna Whitehead is a 38 year old female who presents to discuss weight loss medication.  She previously attempted to get weight loss medication approved through her insurance but was unsuccessful. 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 She has a history of gestational hypertension and asthma. She is interested in trying Zepbound  for weight loss.  She has made lifestyle modifications to aid in weight loss, including making better food choices, meal prepping, cooking at home, and increasing physical activity. She has set up a gym at home and is more active at work. Since June, she has lost 11 pounds, going from 262 to 251 pounds.  She is currently taking Vyvanse  40 mg for ADHD, which helps her stay focused at work without side effects. Her last prescription was filled in July, and she is considering a 90-day supply for convenience.  She experiences acid reflux, particularly in the evenings, and takes omeprazole  to manage symptoms. If she misses doses, she notices symptoms like burning and coughing after two to three days.  Her asthma is primarily triggered by allergies. She does not currently have an inhaler but finds it helpful to have one on hand for episodes of breathlessness.    Patient Active Problem List   Diagnosis Date Noted   New daily persistent headache 06/10/2022   At risk for obstructive sleep apnea 04/13/2020   Bradycardia following surgery 02/05/2020   Abnormal MRI of head 02/04/2020   Anemia during pregnancy in  second trimester 10/03/2019   Obesity 08/29/2019   Family history of breast cancer 07/18/2019   Depression, major, recurrent, moderate (HCC) 07/18/2016   B12 deficiency 07/18/2016   Vitamin D  insufficiency 07/18/2016   History of gestational hypertension 11/14/2015   Asthma, mild 10/13/2014   Anxiety 10/13/2014    Past Surgical History:  Procedure Laterality Date   CESAREAN SECTION  02/04/2020   Procedure: CESAREAN SECTION;  Surgeon: Leonce Garnette BIRCH, MD;  Location: [facility] ORS;  Service: Obstetrics;;    Family History  Problem Relation Age of Onset   Bipolar disorder Mother    Breast cancer Maternal Aunt 75   Breast cancer Maternal Aunt 50   Pancreatic cancer Maternal Aunt 86   Ovarian cancer Maternal Great-grandmother     Social History   Tobacco Use   Smoking status: Never   Smokeless tobacco: Never  Substance Use Topics   Alcohol use: Yes    Comment: occasionally     Current Outpatient Medications:    levonorgestrel  (MIRENA ) 20 MCG/24HR IUD, 1 Intra Uterine Device (1 each total) by Intrauterine route once for 1 dose., Disp: 1 each, Rfl: 0   lisdexamfetamine (VYVANSE ) 40 MG capsule, Take 1 capsule (40 mg total) by mouth every morning., Disp: 30 capsule, Rfl: 0   omeprazole  (PRILOSEC) 40 MG capsule, TAKE 1 CAPSULE BY MOUTH ONCE DAILY, Disp: 90 capsule, Rfl: 1  No Known Allergies  I personally reviewed active problem list, medication list, allergies, family history with the patient/caregiver today.   ROS  Ten systems reviewed  and is negative except as mentioned in HPI    Objective Physical Exam  CONSTITUTIONAL: Patient appears well-developed and well-nourished.  No distress. HEENT: Head atraumatic, normocephalic, neck supple. CARDIOVASCULAR: Normal rate, regular rhythm and normal heart sounds.  No murmur heard. No BLE edema. PULMONARY: Effort normal and breath sounds normal. No respiratory distress. ABDOMINAL: There is no tenderness or  distention. MUSCULOSKELETAL: Normal gait. Without gross motor or sensory deficit. PSYCHIATRIC: Patient has a normal mood and affect. behavior is normal. Judgment and thought content normal.  Vitals:   12/02/23 0814  BP: 110/70  Pulse: 81  Resp: 16  SpO2: 99%  Weight: 251 lb 9.6 oz (114.1 kg)  Height: 5' 10 (1.778 m)    Body mass index is 36.1 kg/m.    PHQ2/9:    12/02/2023    8:13 AM 08/18/2023    1:53 PM 05/22/2023   10:19 AM 04/22/2023   10:17 AM 01/01/2022    1:04 PM  Depression screen PHQ 2/9  Decreased Interest 0 1 2 2  0  Down, Depressed, Hopeless 0 1 2 2  0  PHQ - 2 Score 0 2 4 4  0  Altered sleeping 0 1 2 2  0  Tired, decreased energy 0 1 2 2  0  Change in appetite 0 1 2 2  0  Feeling bad or failure about yourself  0 1 2 2  0  Trouble concentrating 0 1 2 2  0  Moving slowly or fidgety/restless 0 0 0 0 0  Suicidal thoughts 0 0 0 0 0  PHQ-9 Score 0 7 14 14  0  Difficult doing work/chores Not difficult at all Somewhat difficult Very difficult Very difficult Not difficult at all    phq 9 is negative  Fall Risk:    12/02/2023    8:10 AM 08/18/2023    1:49 PM 04/22/2023   10:17 AM 01/01/2022    1:04 PM 12/12/2021    3:05 PM  Fall Risk   Falls in the past year? 0 0 0 0 0  Number falls in past yr: 0 0 0 0 0  Injury with Fall? 0 0 0 0 0  Risk for fall due to : No Fall Risks No Fall Risks No Fall Risks    Follow up Falls evaluation completed Falls prevention discussed;Education provided;Falls evaluation completed Falls prevention discussed;Education provided;Falls evaluation completed        Assessment & Plan Obesity Obesity with BMI 36. Discussed weight loss medications, preferring Zepbound  for greater weight loss. Emphasized lifestyle changes to prevent muscle loss and manage weight. Explained Zepbound  side effects and weight loss benefits. - Prescribe Zepbound , start at 2.5 mg/week, titrate to 5 mg and 7.5 mg as tolerated. - Advise on healthy eating: prioritize protein,  vegetables, nuts; avoid junk food. - Encourage regular physical activity. - Monitor for nausea and constipation.  Asthma Mild intermittent asthma triggered by allergies. Discussed Airsupra  benefits for reducing bronchodilator use. - Prescribe Airsupra  inhaler as needed. - Provide Airsupra  voucher to reduce copay.  Attention-deficit hyperactivity disorder (ADHD) ADHD well-managed with Vyvanse  40 mg. No side effects reported. Addressed prescription gap due to controlled substance regulations. - Prescribe Vyvanse  40 mg, 90-day supply if insurance allows, otherwise three monthly prescriptions.  Gastroesophageal reflux disease (GERD) GERD managed with evening omeprazole . - Prescribe omeprazole  for evening use.  Depression/Anxiety  Depression improved with life changes. Symptoms managed with ADHD treatment, no SSRI needed.  General Health Maintenance Discussed flu shot option. - Administer flu shot.

## 2024-01-09 ENCOUNTER — Encounter (HOSPITAL_BASED_OUTPATIENT_CLINIC_OR_DEPARTMENT_OTHER): Payer: Self-pay

## 2024-01-09 ENCOUNTER — Emergency Department (HOSPITAL_BASED_OUTPATIENT_CLINIC_OR_DEPARTMENT_OTHER)

## 2024-01-09 ENCOUNTER — Other Ambulatory Visit: Payer: Self-pay

## 2024-01-09 ENCOUNTER — Observation Stay (HOSPITAL_BASED_OUTPATIENT_CLINIC_OR_DEPARTMENT_OTHER)
Admission: EM | Admit: 2024-01-09 | Discharge: 2024-01-11 | Disposition: A | Discharge status: Home / Self Care | Attending: Surgery | Admitting: Surgery

## 2024-01-09 DIAGNOSIS — R109 Unspecified abdominal pain: Secondary | ICD-10-CM | POA: Diagnosis present

## 2024-01-09 DIAGNOSIS — K8000 Calculus of gallbladder with acute cholecystitis without obstruction: Principal | ICD-10-CM

## 2024-01-09 DIAGNOSIS — K810 Acute cholecystitis: Secondary | ICD-10-CM | POA: Diagnosis present

## 2024-01-09 DIAGNOSIS — K8012 Calculus of gallbladder with acute and chronic cholecystitis without obstruction: Principal | ICD-10-CM | POA: Insufficient documentation

## 2024-01-09 DIAGNOSIS — J45909 Unspecified asthma, uncomplicated: Secondary | ICD-10-CM | POA: Insufficient documentation

## 2024-01-09 DIAGNOSIS — R001 Bradycardia, unspecified: Secondary | ICD-10-CM | POA: Diagnosis not present

## 2024-01-09 HISTORY — DX: Bradycardia, unspecified: R00.1

## 2024-01-09 LAB — URINALYSIS, ROUTINE W REFLEX MICROSCOPIC
Bilirubin Urine: NEGATIVE
Glucose, UA: NEGATIVE mg/dL
Hgb urine dipstick: NEGATIVE
Ketones, ur: NEGATIVE mg/dL
Leukocytes,Ua: NEGATIVE
Nitrite: NEGATIVE
Protein, ur: NEGATIVE mg/dL
Specific Gravity, Urine: 1.025 (ref 1.005–1.030)
pH: 6.5 (ref 5.0–8.0)

## 2024-01-09 LAB — CBC
HCT: 41.6 % (ref 36.0–46.0)
HCT: 40.1 % (ref 36.0–46.0)
Hemoglobin: 13.3 g/dL (ref 12.0–15.0)
Hemoglobin: 12.6 g/dL (ref 12.0–15.0)
MCH: 28.4 pg (ref 26.0–34.0)
MCH: 28.1 pg (ref 26.0–34.0)
MCHC: 32 g/dL (ref 30.0–36.0)
MCHC: 31.4 g/dL (ref 30.0–36.0)
MCV: 88.7 fL (ref 80.0–100.0)
MCV: 89.5 fL (ref 80.0–100.0)
Platelets: 245 K/uL (ref 150–400)
Platelets: 219 K/uL (ref 150–400)
RBC: 4.69 MIL/uL (ref 3.87–5.11)
RBC: 4.48 MIL/uL (ref 3.87–5.11)
RDW: 14.2 % (ref 11.5–15.5)
RDW: 14 % (ref 11.5–15.5)
WBC: 6.8 K/uL (ref 4.0–10.5)
WBC: 8.1 K/uL (ref 4.0–10.5)
nRBC: 0 % (ref 0.0–0.2)
nRBC: 0 % (ref 0.0–0.2)

## 2024-01-09 LAB — COMPREHENSIVE METABOLIC PANEL WITH GFR
ALT: 13 U/L (ref 0–44)
AST: 15 U/L (ref 15–41)
Albumin: 4.3 g/dL (ref 3.5–5.0)
Alkaline Phosphatase: 72 U/L (ref 38–126)
Anion gap: 9 (ref 5–15)
BUN: 8 mg/dL (ref 6–20)
CO2: 28 mmol/L (ref 22–32)
Calcium: 9.4 mg/dL (ref 8.9–10.3)
Chloride: 102 mmol/L (ref 98–111)
Creatinine, Ser: 0.95 mg/dL (ref 0.44–1.00)
GFR, Estimated: >60 mL/min (ref >60)
Glucose, Bld: 104 mg/dL — ABNORMAL HIGH (ref 70–99)
Potassium: 4 mmol/L (ref 3.5–5.1)
Sodium: 138 mmol/L (ref 135–145)
Total Bilirubin: 0.3 mg/dL (ref 0.0–1.2)
Total Protein: 6.9 g/dL (ref 6.5–8.1)

## 2024-01-09 LAB — CREATININE, SERUM
Creatinine, Ser: 1 mg/dL (ref 0.44–1.00)
GFR, Estimated: >60 mL/min (ref >60)

## 2024-01-09 LAB — TROPONIN T, HIGH SENSITIVITY
Troponin T High Sensitivity: <15 ng/L (ref 0–19)
Troponin T High Sensitivity: <15 ng/L (ref 0–19)

## 2024-01-09 LAB — LIPASE, BLOOD: Lipase: 45 U/L (ref 11–51)

## 2024-01-09 LAB — PREGNANCY, URINE: Preg Test, Ur: NEGATIVE

## 2024-01-09 MED ORDER — PROCHLORPERAZINE EDISYLATE 10 MG/2ML IJ SOLN
10.0000 mg | INTRAMUSCULAR | Status: DC | PRN
  Administered 2024-01-11: 10 mg via INTRAVENOUS
  Filled 2024-01-09: qty 2

## 2024-01-09 MED ORDER — DOCUSATE SODIUM 100 MG PO CAPS
100.0000 mg | ORAL_CAPSULE | Freq: Two times a day (BID) | ORAL | Status: DC
  Administered 2024-01-09 – 2024-01-11 (×3): 100 mg via ORAL
  Filled 2024-01-09 (×3): qty 1

## 2024-01-09 MED ORDER — ENOXAPARIN SODIUM 40 MG/0.4ML IJ SOSY
40.0000 mg | PREFILLED_SYRINGE | INTRAMUSCULAR | Status: DC
  Administered 2024-01-10 – 2024-01-11 (×2): 40 mg via SUBCUTANEOUS
  Filled 2024-01-09 (×2): qty 0.4

## 2024-01-09 MED ORDER — MORPHINE SULFATE (PF) 4 MG/ML IV SOLN
4.0000 mg | Freq: Once | INTRAVENOUS | Status: AC
  Administered 2024-01-09: 4 mg via INTRAVENOUS
  Filled 2024-01-09: qty 1

## 2024-01-09 MED ORDER — METHOCARBAMOL 1000 MG/10ML IJ SOLN: 500.0000 mg | Freq: Four times a day (QID) | INTRAMUSCULAR | Status: DC | PRN

## 2024-01-09 MED ORDER — ONDANSETRON HCL 4 MG/2ML IJ SOLN
4.0000 mg | Freq: Once | INTRAMUSCULAR | Status: AC
  Administered 2024-01-09: 4 mg via INTRAVENOUS
  Filled 2024-01-09: qty 2

## 2024-01-09 MED ORDER — KETOROLAC TROMETHAMINE 15 MG/ML IJ SOLN
15.0000 mg | Freq: Three times a day (TID) | INTRAMUSCULAR | Status: DC
  Administered 2024-01-09 – 2024-01-11 (×4): 15 mg via INTRAVENOUS
  Filled 2024-01-09 (×4): qty 1

## 2024-01-09 MED ORDER — HYDROMORPHONE HCL 1 MG/ML IJ SOLN
0.5000 mg | INTRAMUSCULAR | Status: DC | PRN
  Administered 2024-01-10: 0.5 mg via INTRAVENOUS
  Filled 2024-01-09: qty 0.5

## 2024-01-09 MED ORDER — ACETAMINOPHEN 325 MG PO TABS
650.0000 mg | ORAL_TABLET | Freq: Four times a day (QID) | ORAL | Status: DC
  Administered 2024-01-09 – 2024-01-11 (×6): 650 mg via ORAL
  Filled 2024-01-09 (×7): qty 2

## 2024-01-09 MED ORDER — GABAPENTIN 300 MG PO CAPS
300.0000 mg | ORAL_CAPSULE | Freq: Three times a day (TID) | ORAL | Status: DC
  Administered 2024-01-09 – 2024-01-11 (×4): 300 mg via ORAL
  Filled 2024-01-09 (×4): qty 1

## 2024-01-09 MED ORDER — OXYCODONE HCL 5 MG PO TABS
10.0000 mg | ORAL_TABLET | ORAL | Status: DC | PRN
  Administered 2024-01-10 – 2024-01-11 (×3): 10 mg via ORAL
  Filled 2024-01-09 (×3): qty 2

## 2024-01-09 MED ORDER — OXYCODONE HCL 5 MG PO TABS
5.0000 mg | ORAL_TABLET | ORAL | Status: DC | PRN
Start: 2024-01-09 — End: 2024-01-11
  Administered 2024-01-10: 5 mg via ORAL
  Filled 2024-01-09: qty 1

## 2024-01-09 NOTE — ED Notes (Signed)
 I again attempt to call report to [facility]. Unable to connect with the nurse.

## 2024-01-09 NOTE — ED Triage Notes (Signed)
 Woke up with severe sharp abd pain radiating into chest just before 0600 this morning. Nausea from pain. Denies vomiting. Last DM yesterday and normal. Denies dysuria or hematuria. Ibuprofen  800 mg PO at 0600 today without relief.

## 2024-01-09 NOTE — H&P (Shared)
 Admitting Physician: Deward PARAS Margaretta Chittum  Service: General Surgery  CC: abdominal pain  Subjective   HPI: Anna Whitehead is an 38 y.o. female who is here for abdominal pain.  It is located in the right upper quadrant.  Worse after eating.  Woke her up at 6AM today.  Nausea from the pain.    Past Medical History:  Diagnosis Date   Allergic eczema    Anxiety    Bradycardia    Depression    Dysmenorrhea    Family history of breast cancer    4/21 Cancer genetic testing letter sent; 2/23 pt states she has had neg testing   Increased risk of breast cancer 04/2021   per pt report; mammo to start at age 7   Iron  deficiency anemia 10/03/2019   Iron  deficiency anemia due to chronic blood loss    Menorrhagia    Midline low back pain with left-sided sciatica    Mild asthma    seasonal, not used recently   Overweight (BMI 25.0-29.9)    Right foot pain    Seasonal allergic rhinitis     Past Surgical History:  Procedure Laterality Date   CESAREAN SECTION  02/04/2020   Procedure: CESAREAN SECTION;  Surgeon: Leonce Garnette BIRCH, MD;  Location: [facility] ORS;  Service: Obstetrics;;    Family History  Problem Relation Age of Onset   Bipolar disorder Mother    Breast cancer Maternal Aunt 46   Breast cancer Maternal Aunt 50   Pancreatic cancer Maternal Aunt 35   Ovarian cancer Maternal Great-grandmother     Social:  reports that she has never smoked. She has never used smokeless tobacco. She reports current alcohol use. She reports that she does not use drugs.  Allergies: No Known Allergies  Medications: Current Outpatient Medications  Medication Instructions   Albuterol -Budesonide  (AIRSUPRA ) 90-80 MCG/ACT AERO 2 puffs, Inhalation, 4 times daily   levonorgestrel  (MIRENA ) 20 MCG/24HR IUD 1 each, Intrauterine,  Once   lisdexamfetamine (VYVANSE ) 40 mg, Oral, BH-each morning   omeprazole  (PRILOSEC) 40 mg, Oral, Daily   tirzepatide  (ZEPBOUND ) 2.5 mg, Subcutaneous, Weekly     ROS - all of the below systems have been reviewed with the patient and positives are indicated with bold text General: chills, fever or night sweats Eyes: blurry vision or double vision ENT: epistaxis or sore throat Allergy/Immunology: itchy/watery eyes or nasal congestion Hematologic/Lymphatic: bleeding problems, blood clots or swollen lymph nodes Endocrine: temperature intolerance or unexpected weight changes Breast: new or changing breast lumps or nipple discharge Resp: cough, shortness of breath, or wheezing CV: chest pain or dyspnea on exertion GI: as per HPI GU: dysuria, trouble voiding, or hematuria MSK: joint pain or joint stiffness Neuro: TIA or stroke symptoms Derm: pruritus and skin lesion changes Psych: anxiety and depression  Objective   PE Blood pressure (!) 135/98, pulse 83, temperature 98.5 F (36.9 C), temperature source Oral, resp. rate 15, height 5' 10 (1.778 m), weight 117.9 kg, SpO2 100%. Constitutional: NAD; conversant; no deformities Eyes: Moist conjunctiva; no lid lag; anicteric; PERRL Neck: Trachea midline; no thyromegaly Lungs: Normal respiratory effort; no tactile fremitus CV: RRR; no palpable thrills; no pitting edema GI: Abd Soft, tender RUQ; no palpable hepatosplenomegaly MSK: Normal range of motion of extremities; no clubbing/cyanosis Psychiatric: Appropriate affect; alert and oriented x3 Lymphatic: No palpable cervical or axillary lymphadenopathy  Results for orders placed or performed during the hospital encounter of 01/09/24 (from the past 24 hours)  Lipase, blood  Status: None   Collection Time: 01/09/24  8:49 AM  Result Value Ref Range   Lipase 45 11 - 51 U/L  Comprehensive metabolic panel     Status: Abnormal   Collection Time: 01/09/24  8:49 AM  Result Value Ref Range   Sodium 138 135 - 145 mmol/L   Potassium 4.0 3.5 - 5.1 mmol/L   Chloride 102 98 - 111 mmol/L   CO2 28 22 - 32 mmol/L   Glucose, Bld 104 (H) 70 - 99 mg/dL    BUN 8 6 - 20 mg/dL   Creatinine, Ser 9.04 0.44 - 1.00 mg/dL   Calcium 9.4 8.9 - 89.6 mg/dL   Total Protein 6.9 6.5 - 8.1 g/dL   Albumin 4.3 3.5 - 5.0 g/dL   AST 15 15 - 41 U/L   ALT 13 0 - 44 U/L   Alkaline Phosphatase 72 38 - 126 U/L   Total Bilirubin 0.3 0.0 - 1.2 mg/dL   GFR, Estimated >39 >39 mL/min   Anion gap 9 5 - 15  CBC     Status: None   Collection Time: 01/09/24  8:49 AM  Result Value Ref Range   WBC 6.8 4.0 - 10.5 K/uL   RBC 4.69 3.87 - 5.11 MIL/uL   Hemoglobin 13.3 12.0 - 15.0 g/dL   HCT 58.3 63.9 - 53.9 %   MCV 88.7 80.0 - 100.0 fL   MCH 28.4 26.0 - 34.0 pg   MCHC 32.0 30.0 - 36.0 g/dL   RDW 85.7 88.4 - 84.4 %   Platelets 245 150 - 400 K/uL   nRBC 0.0 0.0 - 0.2 %  Troponin T, High Sensitivity     Status: None   Collection Time: 01/09/24  8:49 AM  Result Value Ref Range   Troponin T High Sensitivity <15 0 - 19 ng/L  Urinalysis, Routine w reflex microscopic -Urine, Clean Catch     Status: None   Collection Time: 01/09/24 10:44 AM  Result Value Ref Range   Color, Urine YELLOW YELLOW   APPearance CLEAR CLEAR   Specific Gravity, Urine 1.025 1.005 - 1.030   pH 6.5 5.0 - 8.0   Glucose, UA NEGATIVE NEGATIVE mg/dL   Hgb urine dipstick NEGATIVE NEGATIVE   Bilirubin Urine NEGATIVE NEGATIVE   Ketones, ur NEGATIVE NEGATIVE mg/dL   Protein, ur NEGATIVE NEGATIVE mg/dL   Nitrite NEGATIVE NEGATIVE   Leukocytes,Ua NEGATIVE NEGATIVE  Pregnancy, urine     Status: None   Collection Time: 01/09/24 10:44 AM  Result Value Ref Range   Preg Test, Ur NEGATIVE NEGATIVE  Troponin T, High Sensitivity     Status: None   Collection Time: 01/09/24 10:56 AM  Result Value Ref Range   Troponin T High Sensitivity <15 0 - 19 ng/L     Imaging Orders         US  Abdomen Limited RUQ (LIVER/GB)     1. Cholelithiasis with sludge and positive sonographic Murphy sign - suspicious for acute cholecystitis despite absence of gallbladder wall thickening.  Assessment and Plan   Anna Whitehead is an 38 y.o. female with abdominal pain found to have acute cholecystitis.  I recommended laparoscopic cholecystectomy.  We discussed the procedure, its risks, benefits and alteratives and the patient granted consent to proceed.     ICD-10-CM   1. Calculus of gallbladder with acute cholecystitis without obstruction  K80.00     2. Sinus bradycardia  R00.1 Ambulatory referral to Cardiology  Deward JINNY Foy, MD  [facility] Use [email or web address] to contact on call provider  New Patient Billing: [phone] - High MDM

## 2024-01-09 NOTE — ED Provider Notes (Signed)
 [place] EMERGENCY DEPARTMENT AT [facility] Provider Note   CSN: [id number] Arrival date & time: 01/09/24  9164     Patient presents with: Abdominal Pain   Anna Whitehead is a 38 y.o. female.   38 year old female presenting with abdominal pain.  Patient reports that sharp abdominal pain woke her up around 6 AM, pain has been constant and is located in her epigastric region with some radiation to her chest, rates pain as a 9/10.  She endorses nausea, no vomiting.  She has experienced issues with similar pain in the past, does not recall any specific diagnosis for this.  Last BM was yesterday and was normal for her.  Denies fever, diarrhea, shortness of breath, lower extremity edema, dysuria, syncope/presyncope.  History of C-section, otherwise no additional abdominal surgeries.   Abdominal Pain Associated symptoms: nausea        Prior to Admission medications   Medication Sig Start Date End Date Taking? Authorizing Provider  Albuterol -Budesonide  (AIRSUPRA ) 90-80 MCG/ACT AERO Inhale 2 puffs into the lungs in the morning, at noon, in the evening, and at bedtime. 12/02/23  Yes Sowles, Krichna, MD  levonorgestrel  (MIRENA ) 20 MCG/24HR IUD 1 Intra Uterine Device (1 each total) by Intrauterine route once for 1 dose. 05/03/20 01/09/24 Yes Leonce Garnette BIRCH, MD  lisdexamfetamine (VYVANSE ) 40 MG capsule Take 1 capsule (40 mg total) by mouth every morning. 12/02/23  Yes Sowles, Krichna, MD  omeprazole  (PRILOSEC) 40 MG capsule Take 1 capsule (40 mg total) by mouth daily. 12/02/23  Yes Sowles, Krichna, MD  tirzepatide  (ZEPBOUND ) 2.5 MG/0.5ML Pen Inject 2.5 mg into the skin once a week. 12/02/23   Sowles, Krichna, MD    Allergies: Patient has no known allergies.    Review of Systems  Gastrointestinal:  Positive for abdominal pain and nausea.    Updated Vital Signs  Vitals:   01/09/24 0845 01/09/24 0915 01/09/24 0930 01/09/24 1000  BP: (!) 134/99 125/86 (!) 137/93 (!)  123/97  Pulse: 75 (!) 58 (!) 52 (!) 37  Resp: 14 16 17 14   Temp: 98.5 F (36.9 C)     SpO2: 100% 100% 98% 96%  Weight:      Height:         Physical Exam Vitals and nursing note reviewed.  HENT:     Head: Normocephalic.  Eyes:     Extraocular Movements: Extraocular movements intact.  Cardiovascular:     Rate and Rhythm: Regular rhythm. Bradycardia present.  Pulmonary:     Effort: Pulmonary effort is normal.     Breath sounds: Normal breath sounds.  Abdominal:     General: Bowel sounds are normal.     Palpations: Abdomen is soft.     Tenderness: There is no abdominal tenderness. There is no guarding.     Comments: (-) Murphy's  Musculoskeletal:     Cervical back: Normal range of motion.     Right lower leg: No edema.     Left lower leg: No edema.     Comments: Moves all extremities spontaneously without difficulty  Skin:    General: Skin is warm and dry.  Neurological:     Mental Status: She is alert and oriented to person, place, and time.     (all labs ordered are listed, but only abnormal results are displayed) Labs Reviewed  COMPREHENSIVE METABOLIC PANEL WITH GFR - Abnormal; Notable for the following components:      Result Value   Glucose, Bld 104 (*)  All other components within normal limits  LIPASE, BLOOD  CBC  URINALYSIS, ROUTINE W REFLEX MICROSCOPIC  PREGNANCY, URINE  TROPONIN T, HIGH SENSITIVITY  TROPONIN T, HIGH SENSITIVITY    EKG: EKG Interpretation Date/Time:  Saturday January 09 2024 08:46:20 EDT Ventricular Rate:  37 PR Interval:  182 QRS Duration:  106 QT Interval:  483 QTC Calculation: 379 R Axis:   92  Text Interpretation: Sinus bradycardia Low voltage, precordial leads Nonspecific T wave abnormality Confirmed by Bernard Drivers ([id number]) on 01/09/2024 8:50:38 AM  Radiology: US  Abdomen Limited RUQ (LIVER/GB) Result Date: 01/09/2024 EXAM: Right Upper Quadrant Abdominal Ultrasound TECHNIQUE: Real-time ultrasonography of the right upper  quadrant of the abdomen was performed. COMPARISON: None. CLINICAL HISTORY: 38 year old female with chronic epigastric and periumbilical pain, nausea, and positive sonographic Murphy's sign. FINDINGS: LIVER: The liver demonstrates normal echogenicity. No intrahepatic biliary ductal dilatation. No mass. BILIARY SYSTEM: Numerous echogenic gallstones are present within the gallbladder (image 3). Superimposed sludge is noted. Individual stone size is up to 13 mm, with limited stone mobility. Stones are visible in the gallbladder neck (image 6). The gallbladder wall thickness remains normal. No pericholecystic fluid. A positive sonographic Murphy's sign was elicited. The common bile duct is within normal limits measuring 4 mm. RIGHT KIDNEY: The right kidney was not visualized. OTHER: No right upper quadrant ascites. IMPRESSION: 1. Cholelithiasis with sludge and positive sonographic Murphy sign - suspicious for acute cholecystitis despite absence of gallbladder wall thickening. Electronically signed by: Helayne Hurst MD 01/09/2024 11:04 AM EDT RP Workstation: [id number]     Procedures   Medications Ordered in the ED  morphine (PF) 4 MG/ML injection 4 mg (4 mg Intravenous Given 01/09/24 0930)  ondansetron  (ZOFRAN ) injection 4 mg (4 mg Intravenous Given 01/09/24 0930)  morphine (PF) 4 MG/ML injection 4 mg (4 mg Intravenous Given 01/09/24 1306)                                    Medical Decision Making This patient presents to the ED for concern of abdominal pain, this involves an extensive number of treatment options, and is a complaint that carries with it a high risk of complications and morbidity.  The differential diagnosis includes gastroenteritis, pancreatitis, appendicitis, diverticulitis, cholecystitis/choledocholithiasis/cholangitis/cholelithiasis   Co morbidities that complicate the patient evaluation  GERD, ADHD, history of bradycardia following surgery   Additional history  obtained:  Additional history obtained from record review  External records from outside source obtained and reviewed including previous cardiology note from 2021   Lab Tests:  I Ordered, and personally interpreted labs.  The pertinent results include: CBC within normal limits, no leukocytosis.  CMP unremarkable.  Lipase within normal limits. Initial troponin < 15, repeat remains unchanged. UA within normal limits. Urine hcg negative.    Imaging Studies ordered:  I ordered imaging studies including RUQ US   I independently visualized and interpreted imaging which showed 1. Cholelithiasis with sludge and positive sonographic Murphy sign - suspicious for acute cholecystitis despite absence of gallbladder wall thickening.  I agree with the radiologist interpretation   Cardiac Monitoring: / EKG:  The patient was maintained on a cardiac monitor.  I personally viewed and interpreted the cardiac monitored which showed an underlying rhythm of: sinus bradycardia   Consultations Obtained:  I requested consultation with the general surgery service,  and discussed lab and imaging findings as well as pertinent plan - they recommend: I spoke with  Dr. Valentine, he states that depending on patient's pain level she can either proceed with surgical intervention today/tomorrow or follow-up outpatient, although outpatient follow-up may take several weeks. If patient would like to proceed with surgical intervention she can be admitted under the general surgery service.    Problem List / ED Course / Critical interventions / Medication management  I ordered medication including morphine for pain and zofran   for nausea  Reevaluation of the patient after these medicines showed that the patient improved I have reviewed the patients home medicines and have made adjustments as needed   Social Determinants of Health:  Insufficient physical activity   Test / Admission - Considered:  Physical exam is  largely unremarkable as above, patient's abdomen is soft and nontender to palpation on exam, no guarding/rigidity.  Patient is generally well-appearing, vitals are notable for sinus bradycardia as low as mid 30's, following patient's pregnancy/C-section in 2021 she was evaluated by cardiology due to bradycardic episodes that she experienced while hospitalized, it was determined that this was likely sinus bradycardia versus vasovagal response, patient is asymptomatic and does not experience presyncope/syncope/palpitations, although she did have presyncopal events during her pregnancy 4 years ago.  She has not been evaluated by cardiology since, it was recommended at that time that she undergo a sleep study however she was never scheduled for this and was lost to follow-up. I will provide the patient with an ambulatory referral to re-establish care given continued sinus bradycardia while in the ED today. Labs are unremarkable as above, she is afebrile and without tachycardia, no signs of systemic illness. At time of my reassessment, patient's pain has significantly improved following administration of morphine/zofran . Given that patient's pain is primarily in the epigastric region, I have some suspicion that cholelithiasis/cholecystitis may be contributing to her symptoms today. Will assess with RUQ US .  RUQ US  notable for cholelithiasis and positive sonographic Murphy's sign, no gallbladder wall thickening. I spoke with general surgery as above.  At time of my reassessment, patient reports that pain is returning. I discussed options of cholecystectomy vs outpatient follow-up as discussed with general surgery as above, she elects to proceed with cholecystectomy today given the severity of her symptoms. I relayed this information to Dr. Lyndel, who plans to admit this patient to the general surgery service with plan for patient to transfer to [facility]. Plan for surgery tomorrow AM due to OR closure, patient can  eat today but should be NPO after midnight.  ~4:30pm, patient requests to transfer herself to [facility] via POV, her mother is at the bedside and will be driving her. Discussed with Dr. Lenor, I do feel that she is safe to transfer via POV, she will discuss this further with nursing staff to obtain directions but does understand that she needs to present straight to [facility], as her surgery will take place tomorrow morning.   Amount and/or Complexity of Data Reviewed Labs: ordered. Radiology: ordered.  Risk Prescription drug management. Decision regarding hospitalization.        Final diagnoses:  Sinus bradycardia  Calculus of gallbladder with acute cholecystitis without obstruction    ED Discharge Orders     None          Glendia Rocky SAILOR, [place] 01/09/24 1639    Bernard Drivers, MD 01/11/24 [phone]

## 2024-01-09 NOTE — ED Notes (Signed)
 Pt. Has left with her mom, going to [facility] via P.O.V. I attempt to call [facility] and receive no answer (5C). She is able to go p.o.v. per V.O. Dr. Lenor. I will attempt to call report again in a few minutes.

## 2024-01-09 NOTE — Progress Notes (Signed)
 Received patient from [facility] to be admitted to room 4C. Husband at bedside. VSS, patient awake, alert, oriented to self, place, time, and date. No history of falls reported.  Patient is scheduled for surgery in AM and will be NPO after midnight.  Patient oriented to room and equipment, bed low position, rails up x 2 for safety, call light in reach.  Patient instructed to call nurse for any questions or concerns.  Ongoing monitoring, respirations even and unlabored.

## 2024-01-10 ENCOUNTER — Encounter (HOSPITAL_COMMUNITY): Payer: Self-pay

## 2024-01-10 ENCOUNTER — Encounter (HOSPITAL_COMMUNITY)
Admission: EM | Disposition: A | Discharge status: Home / Self Care | Payer: Self-pay | Source: Home / Self Care | Attending: Emergency Medicine

## 2024-01-10 ENCOUNTER — Inpatient Hospital Stay (HOSPITAL_COMMUNITY): Admitting: Registered Nurse

## 2024-01-10 HISTORY — PX: CHOLECYSTECTOMY: SHX55

## 2024-01-10 LAB — CBC
HCT: 40 % (ref 36.0–46.0)
Hemoglobin: 12.6 g/dL (ref 12.0–15.0)
MCH: 28.1 pg (ref 26.0–34.0)
MCHC: 31.5 g/dL (ref 30.0–36.0)
MCV: 89.3 fL (ref 80.0–100.0)
Platelets: 228 K/uL (ref 150–400)
RBC: 4.48 MIL/uL (ref 3.87–5.11)
RDW: 14 % (ref 11.5–15.5)
WBC: 6.2 K/uL (ref 4.0–10.5)
nRBC: 0 % (ref 0.0–0.2)

## 2024-01-10 LAB — BASIC METABOLIC PANEL WITH GFR
Anion gap: 10 (ref 5–15)
BUN: 6 mg/dL (ref 6–20)
CO2: 23 mmol/L (ref 22–32)
Calcium: 8.1 mg/dL — ABNORMAL LOW (ref 8.9–10.3)
Chloride: 106 mmol/L (ref 98–111)
Creatinine, Ser: 0.88 mg/dL (ref 0.44–1.00)
GFR, Estimated: >60 mL/min (ref >60)
Glucose, Bld: 97 mg/dL (ref 70–99)
Potassium: 3.5 mmol/L (ref 3.5–5.1)
Sodium: 139 mmol/L (ref 135–145)

## 2024-01-10 SURGERY — LAPAROSCOPIC CHOLECYSTECTOMY
Anesthesia: General

## 2024-01-10 MED ORDER — PROPOFOL 10 MG/ML IV BOLUS
INTRAVENOUS | Status: DC | PRN
  Administered 2024-01-10: 200 mg via INTRAVENOUS

## 2024-01-10 MED ORDER — ALBUTEROL SULFATE (2.5 MG/3ML) 0.083% IN NEBU
2.5000 mg | INHALATION_SOLUTION | Freq: Four times a day (QID) | RESPIRATORY_TRACT | Status: DC
  Administered 2024-01-11 (×2): 2.5 mg via RESPIRATORY_TRACT
  Filled 2024-01-10 (×2): qty 3

## 2024-01-10 MED ORDER — BUDESONIDE 0.25 MG/2ML IN SUSP
0.2500 mg | Freq: Two times a day (BID) | RESPIRATORY_TRACT | Status: DC
  Administered 2024-01-11: 0.25 mg via RESPIRATORY_TRACT
  Filled 2024-01-10 (×2): qty 2

## 2024-01-10 MED ORDER — CHLORHEXIDINE GLUCONATE 0.12 % MT SOLN
15.0000 mL | Freq: Once | OROMUCOSAL | Status: AC
  Administered 2024-01-10: 15 mL via OROMUCOSAL

## 2024-01-10 MED ORDER — HYDROMORPHONE HCL 1 MG/ML IJ SOLN
0.2500 mg | INTRAMUSCULAR | Status: DC | PRN
  Administered 2024-01-10: 0.5 mg via INTRAVENOUS

## 2024-01-10 MED ORDER — ORAL CARE MOUTH RINSE: 15.0000 mL | Freq: Once | OROMUCOSAL | Status: AC

## 2024-01-10 MED ORDER — HYDROMORPHONE HCL 1 MG/ML IJ SOLN
INTRAMUSCULAR | Status: AC
  Filled 2024-01-10: qty 0.5

## 2024-01-10 MED ORDER — BUPIVACAINE-EPINEPHRINE (PF) 0.25% -1:200000 IJ SOLN
INTRAMUSCULAR | Status: AC
  Filled 2024-01-10: qty 30

## 2024-01-10 MED ORDER — HEMOSTATIC AGENTS (NO CHARGE) OPTIME
TOPICAL | Status: DC | PRN
  Administered 2024-01-10: 1 via TOPICAL

## 2024-01-10 MED ORDER — LIDOCAINE 2% (20 MG/ML) 5 ML SYRINGE
INTRAMUSCULAR | Status: AC
Start: 2024-01-10 — End: 2024-01-10
  Filled 2024-01-10: qty 5

## 2024-01-10 MED ORDER — MIDAZOLAM HCL 2 MG/2ML IJ SOLN
INTRAMUSCULAR | Status: AC
  Filled 2024-01-10: qty 2

## 2024-01-10 MED ORDER — DEXMEDETOMIDINE HCL IN NACL 80 MCG/20ML IV SOLN
INTRAVENOUS | Status: DC | PRN
Start: 2024-01-10 — End: 2024-01-10
  Administered 2024-01-10: 10 ug via INTRAVENOUS

## 2024-01-10 MED ORDER — ONDANSETRON HCL 4 MG/2ML IJ SOLN
INTRAMUSCULAR | Status: DC | PRN
  Administered 2024-01-10: 4 mg via INTRAVENOUS

## 2024-01-10 MED ORDER — ROCURONIUM BROMIDE 10 MG/ML (PF) SYRINGE
PREFILLED_SYRINGE | INTRAVENOUS | Status: AC
  Filled 2024-01-10: qty 10

## 2024-01-10 MED ORDER — LIDOCAINE 2% (20 MG/ML) 5 ML SYRINGE
INTRAMUSCULAR | Status: DC | PRN
  Administered 2024-01-10: 100 mg via INTRAVENOUS

## 2024-01-10 MED ORDER — OXYCODONE-ACETAMINOPHEN 5-325 MG PO TABS
1.0000 | ORAL_TABLET | ORAL | 0 refills | Status: DC | PRN
  Filled 2024-01-10: qty 20, 4d supply, fill #0

## 2024-01-10 MED ORDER — CEFAZOLIN SODIUM-DEXTROSE 2-3 GM-%(50ML) IV SOLR
INTRAVENOUS | Status: DC | PRN
Start: 2024-01-10 — End: 2024-01-10
  Administered 2024-01-10: 2 g via INTRAVENOUS

## 2024-01-10 MED ORDER — HYDROMORPHONE HCL 1 MG/ML IJ SOLN
INTRAMUSCULAR | Status: DC | PRN
  Administered 2024-01-10: .5 mg via INTRAVENOUS

## 2024-01-10 MED ORDER — ALBUTEROL-BUDESONIDE 90-80 MCG/ACT IN AERO: 2.0000 | INHALATION_SPRAY | Freq: Four times a day (QID) | RESPIRATORY_TRACT | Status: DC

## 2024-01-10 MED ORDER — MIDAZOLAM HCL (PF) 2 MG/2ML IJ SOLN
INTRAMUSCULAR | Status: DC | PRN
  Administered 2024-01-10: 2 mg via INTRAVENOUS

## 2024-01-10 MED ORDER — HYDROMORPHONE HCL 1 MG/ML IJ SOLN
INTRAMUSCULAR | Status: AC
  Filled 2024-01-10: qty 1

## 2024-01-10 MED ORDER — FENTANYL CITRATE (PF) 250 MCG/5ML IJ SOLN
INTRAMUSCULAR | Status: DC | PRN
  Administered 2024-01-10 (×2): 50 ug via INTRAVENOUS
  Administered 2024-01-10: 100 ug via INTRAVENOUS
  Administered 2024-01-10: 50 ug via INTRAVENOUS

## 2024-01-10 MED ORDER — LACTATED RINGERS IV SOLN: INTRAVENOUS | Status: DC

## 2024-01-10 MED ORDER — PANTOPRAZOLE SODIUM 40 MG PO TBEC
40.0000 mg | DELAYED_RELEASE_TABLET | Freq: Every day | ORAL | Status: DC
  Administered 2024-01-10 – 2024-01-11 (×2): 40 mg via ORAL
  Filled 2024-01-10 (×2): qty 1

## 2024-01-10 MED ORDER — ROCURONIUM BROMIDE 10 MG/ML (PF) SYRINGE
PREFILLED_SYRINGE | INTRAVENOUS | Status: DC | PRN
  Administered 2024-01-10: 60 mg via INTRAVENOUS

## 2024-01-10 MED ORDER — LISDEXAMFETAMINE DIMESYLATE 20 MG PO CAPS: 40.0000 mg | ORAL_CAPSULE | ORAL | Status: DC

## 2024-01-10 MED ORDER — SODIUM CHLORIDE 0.9 % IR SOLN
Status: DC | PRN
  Administered 2024-01-10: 1000 mL

## 2024-01-10 MED ORDER — DROPERIDOL 2.5 MG/ML IJ SOLN: 0.6250 mg | Freq: Once | INTRAMUSCULAR | Status: DC | PRN

## 2024-01-10 MED ORDER — ONDANSETRON HCL 4 MG/2ML IJ SOLN
INTRAMUSCULAR | Status: AC
  Filled 2024-01-10: qty 2

## 2024-01-10 MED ORDER — DEXAMETHASONE SOD PHOSPHATE PF 10 MG/ML IJ SOLN
INTRAMUSCULAR | Status: DC | PRN
  Administered 2024-01-10: 10 mg via INTRAVENOUS

## 2024-01-10 MED ORDER — BUPIVACAINE-EPINEPHRINE (PF) 0.25% -1:200000 IJ SOLN
INTRAMUSCULAR | Status: DC | PRN
  Administered 2024-01-10: 30 mL

## 2024-01-10 MED ORDER — FENTANYL CITRATE (PF) 250 MCG/5ML IJ SOLN
INTRAMUSCULAR | Status: AC
  Filled 2024-01-10: qty 5

## 2024-01-10 MED ORDER — SUGAMMADEX SODIUM 200 MG/2ML IV SOLN
INTRAVENOUS | Status: DC | PRN
  Administered 2024-01-10: 100 mg via INTRAVENOUS
  Administered 2024-01-10: 200 mg via INTRAVENOUS

## 2024-01-10 MED ORDER — SIMETHICONE 40 MG/0.6ML PO SUSP
40.0000 mg | Freq: Four times a day (QID) | ORAL | Status: DC | PRN
  Administered 2024-01-10: 40 mg via ORAL
  Filled 2024-01-10 (×2): qty 0.6

## 2024-01-10 MED ORDER — 0.9 % SODIUM CHLORIDE (POUR BTL) OPTIME
TOPICAL | Status: DC | PRN
  Administered 2024-01-10: 1000 mL

## 2024-01-10 SURGICAL SUPPLY — 37 items
BAG COUNTER SPONGE SURGICOUNT (BAG) ×1 IMPLANT
CANISTER SUCTION 3000ML PPV (SUCTIONS) ×1 IMPLANT
CHLORAPREP W/TINT 26 (MISCELLANEOUS) ×1 IMPLANT
CLIP APPLIE ROT 10 11.4 M/L (STAPLE) ×1 IMPLANT
COVER SURGICAL LIGHT HANDLE (MISCELLANEOUS) ×1 IMPLANT
DERMABOND ADVANCED .7 DNX12 (GAUZE/BANDAGES/DRESSINGS) ×1 IMPLANT
ELECTRODE REM PT RTRN 9FT ADLT (ELECTROSURGICAL) ×1 IMPLANT
ENDOLOOP SUT PDS II 0 18 (SUTURE) IMPLANT
GLOVE BIO SURGEON STRL SZ7.5 (GLOVE) ×1 IMPLANT
GLOVE BIOGEL PI IND STRL 8 (GLOVE) ×1 IMPLANT
GOWN STRL REUS W/ TWL LRG LVL3 (GOWN DISPOSABLE) ×2 IMPLANT
GOWN STRL REUS W/ TWL XL LVL3 (GOWN DISPOSABLE) ×1 IMPLANT
GRASPER SUT TROCAR 14GX15 (MISCELLANEOUS) ×1 IMPLANT
HEMOSTAT SNOW SURGICEL 2X4 (HEMOSTASIS) IMPLANT
IRRIGATION SUCT STRKRFLW 2 WTP (MISCELLANEOUS) ×1 IMPLANT
KIT BASIN OR (CUSTOM PROCEDURE TRAY) ×1 IMPLANT
KIT IMAGING PINPOINTPAQ (MISCELLANEOUS) IMPLANT
KIT TURNOVER KIT B (KITS) ×1 IMPLANT
NDL 22X1.5 STRL (OR ONLY) (MISCELLANEOUS) ×1 IMPLANT
NDL INSUFFLATION 14GA 120MM (NEEDLE) ×1 IMPLANT
NEEDLE 22X1.5 STRL (OR ONLY) (MISCELLANEOUS) ×1 IMPLANT
NEEDLE INSUFFLATION 14GA 120MM (NEEDLE) ×1 IMPLANT
PAD ARMBOARD POSITIONER FOAM (MISCELLANEOUS) ×1 IMPLANT
POUCH RETRIEVAL ECOSAC 10 (ENDOMECHANICALS) ×1 IMPLANT
SCISSORS LAP 5X35 DISP (ENDOMECHANICALS) ×1 IMPLANT
SET TUBE SMOKE EVAC HIGH FLOW (TUBING) ×1 IMPLANT
SLEEVE Z-THREAD 5X100MM (TROCAR) ×2 IMPLANT
SOLN 0.9% NACL POUR BTL 1000ML (IV SOLUTION) ×1 IMPLANT
SOLN STERILE WATER BTL 1000 ML (IV SOLUTION) ×1 IMPLANT
SUT MNCRL AB 4-0 PS2 18 (SUTURE) ×1 IMPLANT
SUT VICRYL 0 UR6 27IN ABS (SUTURE) IMPLANT
TOWEL GREEN STERILE (TOWEL DISPOSABLE) ×1 IMPLANT
TOWEL GREEN STERILE FF (TOWEL DISPOSABLE) ×1 IMPLANT
TRAY LAPAROSCOPIC MC (CUSTOM PROCEDURE TRAY) ×1 IMPLANT
TROCAR 11X100 Z THREAD (TROCAR) ×1 IMPLANT
TROCAR Z-THREAD OPTICAL 5X100M (TROCAR) ×1 IMPLANT
WARMER LAPAROSCOPE (MISCELLANEOUS) ×1 IMPLANT

## 2024-01-10 NOTE — Anesthesia Procedure Notes (Signed)
 Procedure Name: Intubation Date/Time: 01/10/2024 11:14 AM  Performed by: Christopher Comings, CRNAPre-anesthesia Checklist: Patient identified, Emergency Drugs available, Suction available and Patient being monitored Patient Re-evaluated:Patient Re-evaluated prior to induction Oxygen Delivery Method: Circle system utilized Preoxygenation: Pre-oxygenation with 100% oxygen Induction Type: IV induction Ventilation: Mask ventilation without difficulty Laryngoscope Size: Mac and 4 Grade View: Grade I Tube type: Oral Tube size: 7.0 mm Number of attempts: 1 Airway Equipment and Method: Stylet and Oral airway Placement Confirmation: ETT inserted through vocal cords under direct vision, positive ETCO2 and breath sounds checked- equal and bilateral Secured at: 22 cm Tube secured with: Tape Dental Injury: Teeth and Oropharynx as per pre-operative assessment

## 2024-01-10 NOTE — Anesthesia Postprocedure Evaluation (Signed)
 Anesthesia Post Note  Patient: Anna Whitehead Surgeon  Procedure(s) Performed: LAPAROSCOPIC CHOLECYSTECTOMY     Patient location during evaluation: PACU Anesthesia Type: General Level of consciousness: sedated and patient cooperative Pain management: pain level controlled Vital Signs Assessment: post-procedure vital signs reviewed and stable Respiratory status: spontaneous breathing Cardiovascular status: stable Anesthetic complications: no   There were no known notable events for this encounter.  Last Vitals:  Vitals:   01/10/24 1342 01/10/24 2000  BP: 111/79 (!) 137/99  Pulse: (!) 53 93  Resp: 14 18  Temp: 36.7 C 36.8 C  SpO2: 99%     Last Pain:  Vitals:   01/10/24 2000  TempSrc: Oral  PainSc: 7                  Norleen Pope

## 2024-01-10 NOTE — Discharge Instructions (Signed)
 CHOLECYSTECTOMY POST OPERATIVE INSTRUCTIONS  Thinking Clearly  The anesthesia may cause you to feel different for 1 or 2 days. Do not drive, drink alcohol, or make any big decisions for at least 2 days.  Nutrition When you wake up, you will be able to drink small amounts of liquid. If you do not feel sick, you can slowly advance your diet to regular foods. Continue to drink lots of fluids, usually about 8 to 10 glasses per day. Eat a high-fiber diet so you don't strain during bowel movements. High-Fiber Foods Foods high in fiber include beans, bran cereals and whole-grain breads, peas, dried fruit (figs, apricots, and dates), raspberries, blackberries, strawberries, sweet corn, broccoli, baked potatoes with skin, plums, pears, apples, greens, and nuts. Activity Slowly increase your activity. Be sure to get up and walk every hour or so to prevent blood clots. No heavy lifting or strenuous activity for 4 weeks following surgery to prevent hernias at your incision sites It is normal to feel tired. You may need more sleep than usual.  Get your rest but make sure to get up and move around frequently to prevent blood clots and pneumonia.  Work and Return to [employer] can go back to work when you feel well enough. Discuss the timing with your surgeon. You can usually go back to school or work 1 week after an operation. If your work requires heavy lifting or strenuous activity you need to be placed on light duty for 4 weeks following surgery. You can return to gym class, sports or other physical activities 4 weeks after surgery.  Wound Care Always wash your hands before and after touching near your incision site. Do not soak in a bathtub until cleared at your follow up appointment. You may take a shower 24 hours after surgery. A small amount of drainage from the incision is normal. If the drainage is thick and yellow or the site is red, you may have an infection, so call your surgeon. If you  have a drain in one of your incisions, it will be taken out in office when the drainage stops. Steri-Strips will fall off in 7 to 10 days or they will be removed during your first office visit. If you have dermabond glue covering over the incision, allow the glue to flake off on its own. Avoid wearing tight or rough clothing. It may rub your incisions and make it harder for them to heal. Protect the new skin, especially from the sun. The sun can burn and cause darker scarring. Your scar will heal in about 4 to 6 weeks and will become softer and continue to fade over the next year.  The cosmetic appearance of the incisions will improve over the course of the first year after surgery. Sensation around your incision will return in a few weeks or months.  Bowel Movements After intestinal surgery, you may have loose watery stools for several days. If watery diarrhea lasts longer than 3 days, contact your surgeon. Pain medication (narcotics) can cause constipation. Increase the fiber in your diet with high-fiber foods if you are constipated. You can take an over the counter stool softener like Colace to avoid constipation.  Additional over the counter medications can also be used if Colace isn't sufficient (for example, Milk of Magnesia or Miralax).  Pain The amount of pain is different for each person. Some people need only 1 to 3 doses of pain control medication, while others need more. Take alternating doses of tylenol   and ibuprofen around the clock for the first five days following surgery.  This will provide a baseline of pain control and help with inflammation.  Take the narcotic pain medication in addition if needed for severe pain.  Contact Your Surgeon at [phone], if you have: Pain in your right upper abdomen like a gallbladder attack. Pain that will not go away Pain that gets worse A fever of more than 101F (38.3C) Repeated vomiting Swelling, redness, bleeding, or bad-smelling  drainage from your wound site Strong abdominal pain No bowel movement or unable to pass gas for 3 days Watery diarrhea lasting longer than 3 days  Pain Control The goal of pain control is to minimize pain, keep you moving and help you heal. Your surgical team will work with you on your pain plan. Most often a combination of therapies and medications are used to control your pain. You may also be given medication (local anesthetic) at the surgical site. This may help control your pain for several days. Extreme pain puts extra stress on your body at a time when your body needs to focus on healing. Do not wait until your pain has reached a level "10" or is unbearable before telling your doctor or nurse. It is much easier to control pain before it becomes severe. Following a laparoscopic procedure, pain is sometimes felt in the shoulder. This is due to the gas inserted into your abdomen during the procedure. Moving and walking helps to decrease the gas and the right shoulder pain.  Use the guide below for ways to manage your post-operative pain. Learn more by going to facs.org/safepaincontrol.  How Intense Is My Pain Common Therapies to Feel Better       I hardly notice my pain, and it does not interfere with my activities.  I notice my pain and it distracts me, but I can still do activities (sitting up, walking, standing).  Non-Medication Therapies  Ice (in a bag, applied over clothing at the surgical site), elevation, rest, meditation, massage, distraction (music, TV, play) walking and mild exercise Splinting the abdomen with pillows +  Non-Opioid Medications Acetaminophen  (Tylenol ) Non-steroidal anti-inflammatory drugs (NSAIDS) Aspirin, Ibuprofen (Motrin, Advil) Naproxen (Aleve) Take these as needed, when you feel pain. Both acetaminophen  and NSAIDs help to decrease pain and swelling (inflammation).      My pain is hard to ignore and is more noticeable even when I rest.  My  pain interferes with my usual activities.  Non-Medication Therapies  +  Non-Opioid medications  Take on a regular schedule (around-the-clock) instead of as needed. (For example, Tylenol  every 6 hours at 9:00 am, 3:00 pm, 9:00 pm, 3:00 am and Motrin every 6 hours at 12:00 am, 6:00 am, 12:00 pm, 6:00 pm)         I am focused on my pain, and I am not doing my daily activities.  I am groaning in pain, and I cannot sleep. I am unable to do anything.  My pain is as bad as it could be, and nothing else matters.  Non-Medication Therapies  +  Around-the-Clock Non-Opioid Medications  +  Short-acting opioids  Opioids should be used with other medications to manage severe pain. Opioids block pain and give a feeling of euphoria (feel high). Addiction, a serious side effect of opioids, is rare with short-term (a few days) use.  Examples of short-acting opioids include: Tramadol  (Ultram ), Hydrocodone  (Norco, Vicodin), Hydromorphone  (Dilaudid ), Oxycodone  (Oxycontin )     The above directions have been adapted from  the [employer] Surgical Patient Education Program.  Please refer to the ACS website if needed: FreakyMates.[email or web address].ashx.   Deward Foy, MD [facility] [address], [place], [place]  [address] ?  [address] [address], [place], [place]   [address] [phone] ? [phone] ? FAX [phone] Web site: [email or web address]

## 2024-01-10 NOTE — Anesthesia Preprocedure Evaluation (Addendum)
 Anesthesia Evaluation  Patient identified by MRN, date of birth, ID band Patient awake    Reviewed: Allergy & Precautions, Patient's Chart, lab work & pertinent test results  History of Anesthesia Complications Negative for: history of anesthetic complications  Airway Mallampati: II  TM Distance: >3 FB Neck ROM: Full    Dental  (+) Dental Advisory Given, Teeth Intact   Pulmonary asthma (no inhalers in years)    Pulmonary exam normal breath sounds clear to auscultation       Cardiovascular (-) hypertension(-) Past MI and (-) CHF Normal cardiovascular exam(-) dysrhythmias (-) Valvular Problems/Murmurs Rhythm:Regular Rate:Normal  Echo 2021  1. Left ventricular ejection fraction, by estimation, is 60 to 65%. The left  ventricle has normal function. The left ventricle has no regional wall  motion abnormalities. Left ventricular diastolic parameters were normal.   2. Right ventricular systolic function is normal. The right ventricular size  is normal. Tricuspid regurgitation signal is inadequate for assessing PA  pressure.   3. The mitral valve is normal in structure. Trivial mitral valve regurgitation.  No evidence of mitral stenosis.   4. The aortic valve is normal in structure. Aortic valve regurgitation is not  visualized. No aortic stenosis is present.   5. The inferior vena cava is normal in size with greater than 50%  respiratory variability, suggesting right atrial pressure of 3 mmHg.   Conclusion(s)/Recommendation(s): Normal biventricular function without  evidence of hemodynamically significant valvular heart disease.     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)    GI/Hepatic Neg liver ROS,GERD  ,,  Endo/Other  neg diabetes    Renal/GU negative Renal ROS     Musculoskeletal   Abdominal  (+) + obese  Peds   Hematology  (+) Blood dyscrasia, anemia   Anesthesia Other Findings   Reproductive/Obstetrics negative OB ROS                              Anesthesia Physical Anesthesia Plan  ASA: 2  Anesthesia Plan: General   Post-op Pain Management: Tylenol  PO (pre-op)* and Gabapentin  PO (pre-op)*   Induction: Intravenous  PONV Risk Score and Plan: 4 or greater and Dexamethasone , Ondansetron , Treatment may vary due to age or medical condition and Midazolam   Airway Management Planned: Oral ETT  Additional Equipment:   Intra-op Plan:   Post-operative Plan: Extubation in OR  Informed Consent: I have reviewed the patients History and Physical, chart, labs and discussed the procedure including the risks, benefits and alternatives for the proposed anesthesia with the patient or authorized representative who has indicated his/her understanding and acceptance.     Dental advisory given  Plan Discussed with: CRNA  Anesthesia Plan Comments: (Risks of anesthesia explained at length. This includes, but is not limited to, sore throat, damage to teeth, lips gums, tongue and vocal cords, nausea and vomiting, reactions to medications, stroke, heart attack, and death. All patient questions were answered and the patient wishes to proceed. )         Anesthesia Quick Evaluation

## 2024-01-10 NOTE — Op Note (Signed)
 Patient: Bristol Osentoski Hand ([birth year], [id number])  Date of Surgery: 01/10/2024  Preoperative Diagnosis: Cholecystitis   Postoperative Diagnosis: Cholecystitis   Surgical Procedure: LAPAROSCOPIC CHOLECYSTECTOMY:     Operative Team Members:  Surgeons and Role:    * Tyon Cerasoli, Deward PARAS, MD - Primary   Anesthesiologist: Darlyn Rush, MD CRNA: Christopher Comings, CRNA   Anesthesia: General   Fluids:  Total I/O In: 500 [I.V.:500] Out: 20 [Blood:20]  Complications: None  Drains:  none   Specimen:  ID Type Source Tests Collected by Time Destination  1 : Gallbladder Tissue PATH Gallbladder SURGICAL PATHOLOGY Arminda Foglio, Deward PARAS, MD 01/10/2024 1144      Disposition:  PACU - hemodynamically stable.  Plan of Care: Discharge to home after PACU    Indications for Procedure: Lejla Ashley Weisensel is a 38 y.o. female who presented with abdominal pain.  History, physical and imaging was concerning for cholecystitis.  Laparoscopic cholecystectomy was recommended for the patient.  The procedure itself, as well as the risks, benefits and alternatives were discussed with the patient.  Risks discussed included but were not limited to the risk of infection, bleeding, damage to nearby structures, need to convert to open procedure, incisional hernia, bile leak, common bile duct injury and the need for additional procedures or surgeries.  With this discussion complete and all questions answered the patient granted consent to proceed.  Findings: Inflamed gallbladder  Infection status: Patient: [facility] Patient Case: Urgent Infection Present At Time Of Surgery (PATOS): inflamed gallbladder   Description of Procedure:   On the date stated above, the patient was taken to the operating room suite and placed in supine positioning.  Sequential compression devices were placed on the lower extremities to prevent blood clots.  General endotracheal anesthesia  was induced. Preoperative antibiotics were given.  The patient's abdomen was prepped and draped in the usual sterile fashion.  A time-out was completed verifying the correct patient, procedure, positioning and equipment needed for the case.  We began by anesthetizing the skin with local anesthetic and then making a 5 mm incision just below the umbilicus.  We dissected through the subcutaneous tissues to the fascia.  The fascia was grasped and elevated using a Kocher clamp.  A Veress needle was inserted into the abdomen and the abdomen was insufflated to 15 mmHg.  A 5 mm trocar was inserted in this position under optical guidance and then the abdomen was inspected.  There was no trauma to the underlying viscera with initial trocar placement.  Any abnormal findings, other than inflammation in the right upper quadrant, are listed above in the findings section.  Three additional trocars were placed, one 12 mm trocar in the subxiphoid position, one 5 mm trocar in the midline epigastric area and one 5mm trocar in the right upper quadrant subcostally.  These were placed under direct vision without any trauma to the underlying viscera.    The patient was then placed in head up, left side down positioning.  The gallbladder was identified and dissected free from its attachments to the omentum allowing the duodenum to fall away.  The infundibulum of the gallbladder was dissected free working laterally to medially.  The cystic duct and cystic artery were dissected free from surrounding connective tissue.  The infundibulum of the gallbladder was dissected off the cystic plate.  A critical view of safety was obtained with the cystic duct and cystic artery being cleared of connective tissues and clearly the only two structures  entering into the gallbladder with the liver clearly visible behind.  Clips were then applied to the cystic duct and cystic artery and then these structures were divided.  A PDS endoloop was placed on  the cystic duct stump The gallbladder was dissected off the cystic plate, placed in an endocatch bag and removed from the 12 mm subxiphoid port site.  The clips were inspected and appeared effective.  The cystic plate was inspected and hemostasis was obtained using electrocautery.  A suction irrigator was used to clean the operative field.  Attention was turned to closure.  The 12 mm subxiphoid port site was closed using a 0-vicryl suture on a fascial suture passer.  The abdomen was desufflated.  The skin was closed using 4-0 monocryl and dermabond.  All sponge and needle counts were correct at the conclusion of the case.    Deward Foy, MD General, Bariatric, & Minimally Invasive Surgery [facility], [place]

## 2024-01-10 NOTE — Plan of Care (Signed)
  Problem: Pain Managment: Goal: General experience of comfort will improve and/or be controlled Outcome: Progressing   Problem: Safety: Goal: Ability to remain free from injury will improve Outcome: Progressing   Problem: Skin Integrity: Goal: Risk for impaired skin integrity will decrease Outcome: Not Applicable

## 2024-01-10 NOTE — Progress Notes (Signed)
 Admitting Physician: Deward PARAS Arriyah Madej  Service: General Surgery  CC: Abdominal pain  Subjective   HPI: Anna Whitehead is an 38 y.o. female who is here for abdominal pain.  She has sharp pain that woke her up yesterday morning around 6AM.  It is located in the epigastric area, radiates into her chest and it was quite severe.  Some nausea, no vomiting.  Similar issues in the past but never this severe.    Past Medical History:  Diagnosis Date   Allergic eczema    Anxiety    Bradycardia    Depression    Dysmenorrhea    Family history of breast cancer    4/21 Cancer genetic testing letter sent; 2/23 pt states she has had neg testing   Increased risk of breast cancer 04/2021   per pt report; mammo to start at age 55   Iron  deficiency anemia 10/03/2019   Iron  deficiency anemia due to chronic blood loss    Menorrhagia    Midline low back pain with left-sided sciatica    Mild asthma    seasonal, not used recently   Overweight (BMI 25.0-29.9)    Right foot pain    Seasonal allergic rhinitis     Past Surgical History:  Procedure Laterality Date   CESAREAN SECTION  02/04/2020   Procedure: CESAREAN SECTION;  Surgeon: Leonce Garnette BIRCH, MD;  Location: [facility] ORS;  Service: Obstetrics;;    Family History  Problem Relation Age of Onset   Bipolar disorder Mother    Breast cancer Maternal Aunt 64   Breast cancer Maternal Aunt 50   Pancreatic cancer Maternal Aunt 79   Ovarian cancer Maternal Great-grandmother     Social:  reports that she has never smoked. She has never used smokeless tobacco. She reports current alcohol use. She reports that she does not use drugs.  Allergies: No Known Allergies  Medications: Current Outpatient Medications  Medication Instructions   Albuterol -Budesonide  (AIRSUPRA ) 90-80 MCG/ACT AERO 2 puffs, Inhalation, 4 times daily   levonorgestrel  (MIRENA ) 20 MCG/24HR IUD 1 each, Intrauterine,  Once   lisdexamfetamine (VYVANSE ) 40 mg, Oral,  BH-each morning   omeprazole  (PRILOSEC) 40 mg, Oral, Daily   tirzepatide  (ZEPBOUND ) 2.5 mg, Subcutaneous, Weekly    ROS - all of the below systems have been reviewed with the patient and positives are indicated with bold text General: chills, fever or night sweats Eyes: blurry vision or double vision ENT: epistaxis or sore throat Allergy/Immunology: itchy/watery eyes or nasal congestion Hematologic/Lymphatic: bleeding problems, blood clots or swollen lymph nodes Endocrine: temperature intolerance or unexpected weight changes Breast: new or changing breast lumps or nipple discharge Resp: cough, shortness of breath, or wheezing CV: chest pain or dyspnea on exertion GI: as per HPI GU: dysuria, trouble voiding, or hematuria MSK: joint pain or joint stiffness Neuro: TIA or stroke symptoms Derm: pruritus and skin lesion changes Psych: anxiety and depression  Objective   PE Blood pressure 109/77, pulse (!) 54, temperature 98.5 F (36.9 C), temperature source Oral, resp. rate 16, height 5' 10 (1.778 m), weight 117.9 kg, SpO2 97%. Constitutional: NAD; conversant; no deformities Eyes: Moist conjunctiva; no lid lag; anicteric; PERRL Neck: Trachea midline; no thyromegaly Lungs: Normal respiratory effort; no tactile fremitus CV: RRR; no palpable thrills; no pitting edema GI: Abd Soft, tender epigastric area; no palpable hepatosplenomegaly MSK: Normal range of motion of extremities; no clubbing/cyanosis Psychiatric: Appropriate affect; alert and oriented x3 Lymphatic: No palpable cervical or axillary lymphadenopathy  Results for  orders placed or performed during the hospital encounter of 01/09/24 (from the past 24 hours)  Urinalysis, Routine w reflex microscopic -Urine, Clean Catch     Status: None   Collection Time: 01/09/24 10:44 AM  Result Value Ref Range   Color, Urine YELLOW YELLOW   APPearance CLEAR CLEAR   Specific Gravity, Urine 1.025 1.005 - 1.030   pH 6.5 5.0 - 8.0    Glucose, UA NEGATIVE NEGATIVE mg/dL   Hgb urine dipstick NEGATIVE NEGATIVE   Bilirubin Urine NEGATIVE NEGATIVE   Ketones, ur NEGATIVE NEGATIVE mg/dL   Protein, ur NEGATIVE NEGATIVE mg/dL   Nitrite NEGATIVE NEGATIVE   Leukocytes,Ua NEGATIVE NEGATIVE  Pregnancy, urine     Status: None   Collection Time: 01/09/24 10:44 AM  Result Value Ref Range   Preg Test, Ur NEGATIVE NEGATIVE  Troponin T, High Sensitivity     Status: None   Collection Time: 01/09/24 10:56 AM  Result Value Ref Range   Troponin T High Sensitivity <15 0 - 19 ng/L  CBC     Status: None   Collection Time: 01/09/24  9:06 PM  Result Value Ref Range   WBC 8.1 4.0 - 10.5 K/uL   RBC 4.48 3.87 - 5.11 MIL/uL   Hemoglobin 12.6 12.0 - 15.0 g/dL   HCT 59.8 63.9 - 53.9 %   MCV 89.5 80.0 - 100.0 fL   MCH 28.1 26.0 - 34.0 pg   MCHC 31.4 30.0 - 36.0 g/dL   RDW 85.9 88.4 - 84.4 %   Platelets 219 150 - 400 K/uL   nRBC 0.0 0.0 - 0.2 %  Creatinine, serum     Status: None   Collection Time: 01/09/24  9:06 PM  Result Value Ref Range   Creatinine, Ser 1.00 0.44 - 1.00 mg/dL   GFR, Estimated >39 >39 mL/min  Basic metabolic panel with GFR     Status: Abnormal   Collection Time: 01/10/24  3:30 AM  Result Value Ref Range   Sodium 139 135 - 145 mmol/L   Potassium 3.5 3.5 - 5.1 mmol/L   Chloride 106 98 - 111 mmol/L   CO2 23 22 - 32 mmol/L   Glucose, Bld 97 70 - 99 mg/dL   BUN 6 6 - 20 mg/dL   Creatinine, Ser 9.11 0.44 - 1.00 mg/dL   Calcium 8.1 (L) 8.9 - 10.3 mg/dL   GFR, Estimated >39 >39 mL/min   Anion gap 10 5 - 15  CBC     Status: None   Collection Time: 01/10/24  3:30 AM  Result Value Ref Range   WBC 6.2 4.0 - 10.5 K/uL   RBC 4.48 3.87 - 5.11 MIL/uL   Hemoglobin 12.6 12.0 - 15.0 g/dL   HCT 59.9 63.9 - 53.9 %   MCV 89.3 80.0 - 100.0 fL   MCH 28.1 26.0 - 34.0 pg   MCHC 31.5 30.0 - 36.0 g/dL   RDW 85.9 88.4 - 84.4 %   Platelets 228 150 - 400 K/uL   nRBC 0.0 0.0 - 0.2 %     Imaging Orders         US  Abdomen Limited  RUQ (LIVER/GB)      Assessment and Plan   Anna Whitehead is an 38 y.o. female with abdominal pain found to have acute cholecystitis.  I recommended laparoscopic cholecystectomy.  We discussed the procedure, its risks, benefits and alteratives and the patient granted consent to proceed.    ICD-10-CM   1. Calculus of  gallbladder with acute cholecystitis without obstruction  K80.00     2. Sinus bradycardia  R00.1 Ambulatory referral to Cardiology       Deward JINNY Foy, MD  [facility] Use [email or web address] to contact on call provider  New Patient Billing: [phone] - High MDM

## 2024-01-10 NOTE — Transfer of Care (Signed)
 Immediate Anesthesia Transfer of Care Note  Patient: Anna Whitehead  Procedure(s) Performed: LAPAROSCOPIC CHOLECYSTECTOMY  Patient Location: PACU  Anesthesia Type:General  Level of Consciousness: drowsy and patient cooperative  Airway & Oxygen Therapy: Patient connected to face mask oxygen  Post-op Assessment: Report given to RN and Post -op Vital signs reviewed and stable  Post vital signs: Reviewed and stable  Last Vitals:  Vitals Value Taken Time  BP 151/105 01/10/24 12:20  Temp    Pulse 48 01/10/24 12:24  Resp 8 01/10/24 12:24  SpO2 100 % 01/10/24 12:24  Vitals shown include unfiled device data.  Last Pain:  Vitals:   01/10/24 1006  TempSrc: Oral  PainSc: 5       Patients Stated Pain Goal: 1 (01/10/24 1006)  Complications: There were no known notable events for this encounter.

## 2024-01-10 NOTE — Progress Notes (Signed)
 Notified MD that patient is not ready for d/c tonight d/t pain and need for IV pain medications.

## 2024-01-11 ENCOUNTER — Encounter: Payer: Self-pay | Admitting: Oncology

## 2024-01-11 ENCOUNTER — Other Ambulatory Visit (HOSPITAL_BASED_OUTPATIENT_CLINIC_OR_DEPARTMENT_OTHER): Payer: Self-pay

## 2024-01-11 ENCOUNTER — Encounter (HOSPITAL_COMMUNITY): Payer: Self-pay | Admitting: Surgery

## 2024-01-11 LAB — BASIC METABOLIC PANEL WITH GFR
Anion gap: 8 (ref 5–15)
BUN: 5 mg/dL — ABNORMAL LOW (ref 6–20)
CO2: 24 mmol/L (ref 22–32)
Calcium: 8.4 mg/dL — ABNORMAL LOW (ref 8.9–10.3)
Chloride: 103 mmol/L (ref 98–111)
Creatinine, Ser: 0.76 mg/dL (ref 0.44–1.00)
GFR, Estimated: >60 mL/min (ref >60)
Glucose, Bld: 108 mg/dL — ABNORMAL HIGH (ref 70–99)
Potassium: 3.7 mmol/L (ref 3.5–5.1)
Sodium: 135 mmol/L (ref 135–145)

## 2024-01-11 LAB — CBC
HCT: 38.4 % (ref 36.0–46.0)
Hemoglobin: 12.4 g/dL (ref 12.0–15.0)
MCH: 28.4 pg (ref 26.0–34.0)
MCHC: 32.3 g/dL (ref 30.0–36.0)
MCV: 88.1 fL (ref 80.0–100.0)
Platelets: 251 K/uL (ref 150–400)
RBC: 4.36 MIL/uL (ref 3.87–5.11)
RDW: 13.5 % (ref 11.5–15.5)
WBC: 11.1 K/uL — ABNORMAL HIGH (ref 4.0–10.5)
nRBC: 0 % (ref 0.0–0.2)

## 2024-01-11 MED ORDER — ALBUTEROL SULFATE (2.5 MG/3ML) 0.083% IN NEBU: 2.5000 mg | INHALATION_SOLUTION | Freq: Three times a day (TID) | RESPIRATORY_TRACT | Status: DC

## 2024-01-11 MED ORDER — POLYETHYLENE GLYCOL 3350 17 G PO PACK: 17.0000 g | PACK | Freq: Every day | ORAL | Status: DC | PRN

## 2024-01-11 MED ORDER — PANTOPRAZOLE SODIUM 40 MG PO TBEC: 40.0000 mg | DELAYED_RELEASE_TABLET | Freq: Two times a day (BID) | ORAL | Status: DC

## 2024-01-11 MED ORDER — POLYETHYLENE GLYCOL 3350 17 G PO PACK
17.0000 g | PACK | Freq: Every day | ORAL | Status: DC
  Administered 2024-01-11: 17 g via ORAL
  Filled 2024-01-11: qty 1

## 2024-01-11 MED ORDER — ONDANSETRON HCL 4 MG PO TABS
4.0000 mg | ORAL_TABLET | Freq: Four times a day (QID) | ORAL | 0 refills | Status: DC
  Filled 2024-01-11: qty 12, 3d supply, fill #0

## 2024-01-11 MED ORDER — ONDANSETRON HCL 4 MG/2ML IJ SOLN
4.0000 mg | Freq: Four times a day (QID) | INTRAMUSCULAR | Status: DC
  Administered 2024-01-11: 4 mg via INTRAVENOUS
  Filled 2024-01-11: qty 2

## 2024-01-11 MED ORDER — CALCIUM CARBONATE ANTACID 500 MG PO CHEW
1.0000 | CHEWABLE_TABLET | Freq: Four times a day (QID) | ORAL | Status: DC | PRN
  Administered 2024-01-11: 200 mg via ORAL
  Filled 2024-01-11: qty 1

## 2024-01-11 NOTE — TOC Transition Note (Signed)
 Transition of Care [facility]) - Discharge Note   Patient Details  Name: Anna Whitehead MRN: [id number] Date of Birth: April 29, 1985  Transition of Care [facility]) CM/SW Contact:  Roxie KANDICE Stain, RN Phone Number: 01/11/2024, 4:03 PM   Clinical Narrative:    Lauraine Rosina Lizama is stable to discharge home. Follow up apt on AVS. No ICM (Inpatient Care Management) needs at this time.    Final next level of care: Home/Self Care Barriers to Discharge: Barriers Resolved   Patient Goals and CMS Choice Patient states their goals for this hospitalization and ongoing recovery are:: return home          Discharge Placement               home        Discharge Plan and Services Additional resources added to the After Visit Summary for                                       Social Drivers of Health (SDOH) Interventions SDOH Screenings   Food Insecurity: No Food Insecurity (01/09/2024)  Housing: Unknown (01/09/2024)  Transportation Needs: No Transportation Needs (01/09/2024)  Utilities: Not At Risk (01/09/2024)  Alcohol Screen: Low Risk  (05/22/2023)  Depression (PHQ2-9): Low Risk  (12/02/2023)  Financial Resource Strain: Low Risk  (05/22/2023)  Physical Activity: Insufficiently Active (05/22/2023)  Social Connections: Socially Integrated (05/22/2023)  Stress: Stress Concern Present (05/22/2023)  Tobacco Use: Low Risk  (01/10/2024)  Health Literacy: Adequate Health Literacy (05/22/2023)     Readmission Risk Interventions    01/11/2024    4:03 PM  Readmission Risk Prevention Plan  Post Dischage Appt Complete  Medication Screening Complete  Transportation Screening Complete

## 2024-01-11 NOTE — Progress Notes (Signed)
 RN went over DC instructions with the patient and the patient stated understanding IV has been, removed and patient is waiting on her ride. Medications escribed to her home pharmacy.

## 2024-01-11 NOTE — TOC CM/SW Note (Signed)
 Transition of Care [facility]) - Inpatient Brief Assessment   Patient Details  Name: Anna Whitehead MRN: [id number] Date of Birth: [birth year]  Transition of Care [facility]) CM/SW Contact:    Lauraine FORBES Saa, LCSWA Phone Number: 01/11/2024, 9:01 AM   Clinical Narrative:  9:01 AM Per chart review, patient has a PCP and insurance. Patient does not have SNF/HH/DME history. Patient's preferred pharmacy is [facility]. No TOC needs identified at this time. TOC will continue to follow.  Transition of Care Asessment: Insurance and Status: Insurance coverage has been reviewed Patient has primary care physician: Yes Home environment has been reviewed: Private Residence Prior level of function:: N/A Prior/Current Home Services: No current home services Social Drivers of Health Review: SDOH reviewed no interventions necessary Readmission risk has been reviewed: Yes (Currently Green 8%) Transition of care needs: no transition of care needs at this time

## 2024-01-11 NOTE — Progress Notes (Signed)
 Progress Note  1 Day Post-Op  Interval: POD 1. Some nausea yesterday, has not eaten much. About to eat breakfast. States she is sore from surgery.   Objective: Vital signs in last 24 hours: Temp:  [98 F (36.7 C)-98.2 F (36.8 C)] 98 F (36.7 C) (10/27 0754) Pulse Rate:  [41-93] 69 (10/27 0754) Resp:  [10-18] 16 (10/27 0754) BP: (105-151)/(66-105) 105/66 (10/27 0754) SpO2:  [95 %-100 %] 100 % (10/27 0842) Last BM Date : 01/09/24  Intake/Output from previous day: 10/26 0701 - 10/27 0700 In: 500 [I.V.:500] Out: 20 [Blood:20] Intake/Output this shift: No intake/output data recorded.  PE: General: pleasant, Female who is laying in bed in NAD Heart: regular, rate, and rhythm Lungs: Normal work of breathing on room air Abd: soft, appropriately tender, non-distended MS: all 4 extremities are symmetrical with no cyanosis, clubbing, or edema. Skin: warm and dry with no masses, lesions, or rashes Neuro: No focal neurologic deficits   Lab Results:  Recent Labs    01/10/24 0330 01/11/24 0445  WBC 6.2 11.1*  HGB 12.6 12.4  HCT 40.0 38.4  PLT 228 251   BMET Recent Labs    01/10/24 0330 01/11/24 0445  NA 139 135  K 3.5 3.7  CL 106 103  CO2 23 24  GLUCOSE 97 108*  BUN 6 5*  CREATININE 0.88 0.76  CALCIUM 8.1* 8.4*   PT/INR No results for input(s): LABPROT, INR in the last 72 hours. CMP     Component Value Date/Time   NA 135 01/11/2024 0445   NA 139 07/03/2022 1120   K 3.7 01/11/2024 0445   CL 103 01/11/2024 0445   CO2 24 01/11/2024 0445   GLUCOSE 108 (H) 01/11/2024 0445   BUN 5 (L) 01/11/2024 0445   BUN 12 07/03/2022 1120   CREATININE 0.76 01/11/2024 0445   CREATININE 0.82 05/22/2023 1110   CALCIUM 8.4 (L) 01/11/2024 0445   PROT 6.9 01/09/2024 0849   PROT 7.0 07/03/2022 1120   ALBUMIN 4.3 01/09/2024 0849   ALBUMIN 4.3 07/03/2022 1120   AST 15 01/09/2024 0849   ALT 13 01/09/2024 0849   ALKPHOS 72 01/09/2024 0849   BILITOT 0.3 01/09/2024 0849    BILITOT 0.2 07/03/2022 1120   GFRNONAA >60 01/11/2024 0445   GFRNONAA 110 04/13/2019 1638   GFRAA 129 09/13/2019 1631   GFRAA 127 04/13/2019 1638   Lipase     Component Value Date/Time   LIPASE 45 01/09/2024 0849       Studies/Results: US  Abdomen Limited RUQ (LIVER/GB) Result Date: 01/09/2024 EXAM: Right Upper Quadrant Abdominal Ultrasound TECHNIQUE: Real-time ultrasonography of the right upper quadrant of the abdomen was performed. COMPARISON: None. CLINICAL HISTORY: 38 year old female with chronic epigastric and periumbilical pain, nausea, and positive sonographic Murphy's sign. FINDINGS: LIVER: The liver demonstrates normal echogenicity. No intrahepatic biliary ductal dilatation. No mass. BILIARY SYSTEM: Numerous echogenic gallstones are present within the gallbladder (image 3). Superimposed sludge is noted. Individual stone size is up to 13 mm, with limited stone mobility. Stones are visible in the gallbladder neck (image 6). The gallbladder wall thickness remains normal. No pericholecystic fluid. A positive sonographic Murphy's sign was elicited. The common bile duct is within normal limits measuring 4 mm. RIGHT KIDNEY: The right kidney was not visualized. OTHER: No right upper quadrant ascites. IMPRESSION: 1. Cholelithiasis with sludge and positive sonographic Murphy sign - suspicious for acute cholecystitis despite absence of gallbladder wall thickening. Electronically signed by: Helayne Hurst MD 01/09/2024 11:04 AM EDT RP  Workstation: [id number]     Assessment/Plan 38 y.o. female s/p laparoscopic cholecystectomy for cholecystitis/cholelithiasis  LOS: 1 day   - Regular diet - Multimodal pain control - Patient to eat breakfast and if tolerates well, can dc home this PM - Miralax for constipation. Discussed with patient taking miralax at home on discharge as narcotics can worsen constipation  I reviewed last 24 h vitals and pain scores, last 48 h intake and output, last 24 h labs  and trends, and last 24 h imaging results.    Richerd Silversmith, MD [facility] Surgery 01/11/2024, 10:16 AM Please see Amion for pager number during day hours 7:00am-4:30pm

## 2024-01-11 NOTE — Discharge Summary (Signed)
    Patient ID: Anna Whitehead [id number] [birth year] 38 y.o.  Admit date: 01/09/2024 Discharge date: 01/11/2024  Admitting Diagnosis: Calculus of gallbladder with acute cholecystitis without obstruction   Discharge Diagnosis S/p Laparoscopic Cholecystectomy by Dr. Lyndel on 01/10/24  Consultants None  HPI: Anna Whitehead is an 38 y.o. female who is here for abdominal pain.  She has sharp pain that woke her up yesterday morning around 6AM.  It is located in the epigastric area, radiates into her chest and it was quite severe.  Some nausea, no vomiting.  Similar issues in the past but never this severe.    Procedures Dr. Lyndel - 01/10/24 Laparoscopic Cholecystectomy  Hospital Course:  Patient was admitted as above for acute cholecystitis. She was taken to the OR by Dr. Lyndel on 01/10/24 for Laparoscopic Cholecystectomy. Patient tolerated the procedure well. On POD 1 the patient was felt stable for discharge. Please see progress notes for more details.   I was not directly involved in this patient's care and did not see the patient during their hospital stay, therefore the information in this discharge summary was taken entirely from the chart.   Allergies as of 01/11/2024   No Known Allergies      Medication List     STOP taking these medications    doxylamine  (Sleep) 25 MG tablet Commonly known as: UNISOM    MELATONIN PO       TAKE these medications    Airsupra  90-80 MCG/ACT Aero Generic drug: Albuterol -Budesonide  Inhale 2 puffs into the lungs in the morning, at noon, in the evening, and at bedtime.   levonorgestrel  20 MCG/24HR IUD Commonly known as: MIRENA  1 Intra Uterine Device (1 each total) by Intrauterine route once for 1 dose.   lisdexamfetamine 40 MG capsule Commonly known as: Vyvanse  Take 1 capsule (40 mg total) by mouth every morning. What changed:  when to take this reasons to take this   omeprazole  40 MG  capsule Commonly known as: PRILOSEC Take 1 capsule (40 mg total) by mouth daily.   ondansetron  4 MG tablet Commonly known as: ZOFRAN  Take 1 tablet (4 mg total) by mouth every 6 (six) hours.   oxyCODONE -acetaminophen  5-325 MG tablet Commonly known as: Percocet Take 1 tablet by mouth every 4 (four) hours as needed.   tirzepatide  2.5 MG/0.5ML Pen Commonly known as: ZEPBOUND  Inject 2.5 mg into the skin once a week.          Follow-up Information     Anna Whitehead, Anna Gosai, PA-C Follow up.   Specialty: General Surgery Why: Call to confirm your appointment date and time, bring a copy of your photo ID and insurance card, arrive 30 minutes prior to your appointment Contact information: [address] [place] [address] [place] [address] [phone]                 Signed: Ozell CHRISTELLA Whitehead, [facility] Surgery 01/11/2024, 3:23 PM Please see Amion for pager number during day hours 7:00am-4:30pm

## 2024-01-11 NOTE — Plan of Care (Signed)
  Problem: Health Behavior/Discharge Planning: Goal: Ability to manage health-related needs will improve Outcome: Adequate for Discharge   Problem: Clinical Measurements: Goal: Will remain free from infection Outcome: Adequate for Discharge   Problem: Pain Managment: Goal: General experience of comfort will improve and/or be controlled Outcome: Adequate for Discharge   Problem: Safety: Goal: Ability to remain free from injury will improve Outcome: Adequate for Discharge

## 2024-01-15 LAB — SURGICAL PATHOLOGY

## 2024-01-18 ENCOUNTER — Encounter: Payer: Self-pay | Admitting: Family Medicine

## 2024-01-18 ENCOUNTER — Telehealth: Admitting: Emergency Medicine

## 2024-01-18 DIAGNOSIS — K921 Melena: Secondary | ICD-10-CM

## 2024-01-18 NOTE — Patient Instructions (Signed)
  Anna Whitehead, thank you for joining Jon CHRISTELLA Belt, NP for today's virtual visit.  While this provider is not your primary care provider (PCP), if your PCP is located in our provider database this encounter information will be shared with them immediately following your visit.   A [facility] MyChart account gives you access to today's visit and all your visits, tests, and labs performed at [facility]  click here if you don't have a [facility] MyChart account or go to [email or web address]  Consent: (Patient) Anna Whitehead provided verbal consent for this virtual visit at the beginning of the encounter.  Current Medications:  Current Outpatient Medications:    Albuterol -Budesonide  (AIRSUPRA ) 90-80 MCG/ACT AERO, Inhale 2 puffs into the lungs in the morning, at noon, in the evening, and at bedtime. (Patient not taking: Reported on 01/11/2024), Disp: 10.7 g, Rfl: 1   levonorgestrel  (MIRENA ) 20 MCG/24HR IUD, 1 Intra Uterine Device (1 each total) by Intrauterine route once for 1 dose., Disp: 1 each, Rfl: 0   lisdexamfetamine (VYVANSE ) 40 MG capsule, Take 1 capsule (40 mg total) by mouth every morning. (Patient taking differently: Take 40 mg by mouth daily as needed (ADHD).), Disp: 90 capsule, Rfl: 0   omeprazole  (PRILOSEC) 40 MG capsule, Take 1 capsule (40 mg total) by mouth daily., Disp: 90 capsule, Rfl: 1   ondansetron  (ZOFRAN ) 4 MG tablet, Take 1 tablet (4 mg total) by mouth every 6 (six) hours., Disp: 12 tablet, Rfl: 0   oxyCODONE -acetaminophen  (PERCOCET) 5-325 MG tablet, Take 1 tablet by mouth every 4 (four) hours as needed., Disp: 20 tablet, Rfl: 0   polyethylene glycol (MIRALAX / GLYCOLAX) 17 g packet, Take 17 g by mouth daily as needed., Disp: , Rfl:    tirzepatide  (ZEPBOUND ) 2.5 MG/0.5ML Pen, Inject 2.5 mg into the skin once a week. (Patient not taking: Reported on 01/11/2024), Disp: 2 mL, Rfl: 0   Medications ordered in this encounter:  No orders of the  defined types were placed in this encounter.    *If you need refills on other medications prior to your next appointment, please contact your pharmacy*  Follow-Up: Call back or seek an in-person evaluation if the symptoms worsen or if the condition fails to improve as anticipated.  [facility] [phone]  Other Instructions I think the blood in your stool and in the toilet is from minor trauma to the anus/rectum during bowel movements. It should improve and go away.   If it is prolonged (lasting several more days), increasing in the amount of blood you are passing, if you start passing dark, tarry, old blood, or if you feel sick in other ways (vomiting, worsening abd pain, fever), you will need to be checked in person.    If you have been instructed to have an in-person evaluation today at a local Urgent Care facility, please use the link below. It will take you to a list of all of our available [facility], including address, phone number and hours of operation. Please do not delay care.  [facility]  If you or a family member do not have a primary care provider, use the link below to schedule a visit and establish care. When you choose a [facility] primary care physician or advanced practice provider, you gain a long-term partner in health. Find a Primary Care Provider  Learn more about [facility]'s in-office and virtual care options: [facility] - Get Care Now

## 2024-01-18 NOTE — Progress Notes (Signed)
 Virtual Visit Consent   Anna Whitehead, you are scheduled for a virtual visit with a [facility] provider today. Just as with appointments in the office, your consent must be obtained to participate. Your consent will be active for this visit and any virtual visit you may have with one of our providers in the next 365 days. If you have a MyChart account, a copy of this consent can be sent to you electronically.  As this is a virtual visit, video technology does not allow for your provider to perform a traditional examination. This may limit your provider's ability to fully assess your condition. If your provider identifies any concerns that need to be evaluated in person or the need to arrange testing (such as labs, EKG, etc.), we will make arrangements to do so. Although advances in technology are sophisticated, we cannot ensure that it will always work on either your end or our end. If the connection with a video visit is poor, the visit may have to be switched to a telephone visit. With either a video or telephone visit, we are not always able to ensure that we have a secure connection.  By engaging in this virtual visit, you consent to the provision of healthcare and authorize for your insurance to be billed (if applicable) for the services provided during this visit. Depending on your insurance coverage, you may receive a charge related to this service.  I need to obtain your verbal consent now. Are you willing to proceed with your visit today? Anna Whitehead has provided verbal consent on 01/18/2024 for a virtual visit (video or telephone). Jon CHRISTELLA Belt, NP  Date: 01/18/2024 10:41 AM   Virtual Visit via Video Note   I, Jon CHRISTELLA Belt, connected with  Anna Whitehead  ([id number], [birth year]) on 01/18/24 at 10:30 AM EST by a video-enabled telemedicine application and verified that I am speaking with the correct person using two identifiers.  Location: Patient: Virtual Visit  Location Patient: Home Provider: Virtual Visit Location Provider: Home Office   I discussed the limitations of evaluation and management by telemedicine and the availability of in person appointments. The patient expressed understanding and agreed to proceed.    History of Present Illness: Anna Whitehead is a 38 y.o. who identifies as a female who was assigned female at birth, and is being seen today for blood in stool. Had cholecystectomy 01/09/24, no bowel movement for several days. Has been taking percocet, down to one a day, and using miralax to prevent constipation. Had diarrhea 10/31 and 11/1, solid stool on 11/2. With solid stool, saw blood on toilet paper. Then had streaks of blood on stool and some red blood in toilet. No fever, vomiting, abd pain is improving, no blood in underpants, no dark tarry old blood.   HPI: HPI  Problems:  Patient Active Problem List   Diagnosis Date Noted   Acute cholecystitis 01/09/2024   Gastroesophageal reflux disease without esophagitis 12/02/2023   New daily persistent headache 06/10/2022   At risk for obstructive sleep apnea 04/13/2020   Bradycardia following surgery 02/05/2020   Abnormal MRI of head 02/04/2020   Anemia during pregnancy in second trimester 10/03/2019   Obesity (BMI 30-39.9) 08/29/2019   Family history of breast cancer 07/18/2019   Depression, major, recurrent, moderate (HCC) 07/18/2016   B12 deficiency 07/18/2016   Vitamin D  insufficiency 07/18/2016   History of gestational hypertension 11/14/2015   Asthma, mild 10/13/2014   Anxiety 10/13/2014  Allergies: No Known Allergies Medications:  Current Outpatient Medications:    Albuterol -Budesonide  (AIRSUPRA ) 90-80 MCG/ACT AERO, Inhale 2 puffs into the lungs in the morning, at noon, in the evening, and at bedtime. (Patient not taking: Reported on 01/11/2024), Disp: 10.7 g, Rfl: 1   levonorgestrel  (MIRENA ) 20 MCG/24HR IUD, 1 Intra Uterine Device (1 each total) by  Intrauterine route once for 1 dose., Disp: 1 each, Rfl: 0   lisdexamfetamine (VYVANSE ) 40 MG capsule, Take 1 capsule (40 mg total) by mouth every morning. (Patient taking differently: Take 40 mg by mouth daily as needed (ADHD).), Disp: 90 capsule, Rfl: 0   omeprazole  (PRILOSEC) 40 MG capsule, Take 1 capsule (40 mg total) by mouth daily., Disp: 90 capsule, Rfl: 1   ondansetron  (ZOFRAN ) 4 MG tablet, Take 1 tablet (4 mg total) by mouth every 6 (six) hours., Disp: 12 tablet, Rfl: 0   oxyCODONE -acetaminophen  (PERCOCET) 5-325 MG tablet, Take 1 tablet by mouth every 4 (four) hours as needed., Disp: 20 tablet, Rfl: 0   polyethylene glycol (MIRALAX / GLYCOLAX) 17 g packet, Take 17 g by mouth daily as needed., Disp: , Rfl:    tirzepatide  (ZEPBOUND ) 2.5 MG/0.5ML Pen, Inject 2.5 mg into the skin once a week. (Patient not taking: Reported on 01/11/2024), Disp: 2 mL, Rfl: 0  Observations/Objective: Patient is well-developed, well-nourished in no acute distress.  Resting comfortably  at home.  Head is normocephalic, atraumatic.  No labored breathing.  Speech is clear and coherent with logical content.  Patient is alert and oriented at baseline.    Assessment and Plan: 1. Blood in stool (Primary)  Given reassurance  Follow Up Instructions: I discussed the assessment and treatment plan with the patient. The patient was provided an opportunity to ask questions and all were answered. The patient agreed with the plan and demonstrated an understanding of the instructions.  A copy of instructions were sent to the patient via MyChart unless otherwise noted below.     The patient was advised to call back or seek an in-person evaluation if the symptoms worsen or if the condition fails to improve as anticipated.    Jon CHRISTELLA Belt, NP

## 2024-02-02 ENCOUNTER — Other Ambulatory Visit: Payer: Self-pay | Admitting: Family Medicine

## 2024-02-05 ENCOUNTER — Other Ambulatory Visit: Payer: Self-pay | Admitting: Family Medicine

## 2024-02-05 MED ORDER — LISDEXAMFETAMINE DIMESYLATE 40 MG PO CAPS: 40.0000 mg | ORAL_CAPSULE | ORAL | 0 refills | Status: DC

## 2024-02-05 NOTE — Telephone Encounter (Signed)
 Requested medication (s) are due for refill today: Yes  Requested medication (s) are on the active medication list: Yes  Last refill:  12/02/23  Future visit scheduled: Yes  Notes to clinic:  Not delegated.    Requested Prescriptions  Pending Prescriptions Disp Refills   lisdexamfetamine (VYVANSE ) 40 MG capsule [Pharmacy Med Name: LISDEXAMFETAMINE DIMESYLATE  40 MG C] 90 capsule     Sig: TAKE 1 CAPSULE BY MOUTH EVERY MORNING     Not Delegated - Psychiatry:  Stimulants/ADHD Failed - 02/05/2024 11:15 AM      Failed - This refill cannot be delegated      Failed - Urine Drug Screen completed in last 360 days      Passed - Last BP in normal range    BP Readings from Last 1 Encounters:  01/11/24 105/66         Passed - Last Heart Rate in normal range    Pulse Readings from Last 1 Encounters:  01/11/24 69         Passed - Valid encounter within last 6 months    Recent Outpatient Visits           2 months ago Obesity (BMI 30-39.9)   [facility] Glenard Mire, MD   5 months ago Attention deficit hyperactivity disorder (ADHD), predominantly inattentive type   [facility] Glenard Mire, MD   8 months ago Inattention   [facility] Gareth Mliss FALCON, FNP   8 months ago Well adult exam   [place] Glenard Mire, MD   9 months ago Sore throat   [facility] Glenard Mire, MD       Future Appointments             In 3 months Glenard, Krichna, MD [facility] [place]

## 2024-02-05 NOTE — Telephone Encounter (Signed)
 Can't refuse b/c controlled

## 2024-02-24 ENCOUNTER — Ambulatory Visit: Admitting: Family Medicine

## 2024-02-24 ENCOUNTER — Encounter: Payer: Self-pay | Admitting: Family Medicine

## 2024-02-24 VITALS — BP 118/68 | HR 65 | Resp 16 | Ht 70.0 in | Wt 236.0 lb

## 2024-02-24 DIAGNOSIS — J3489 Other specified disorders of nose and nasal sinuses: Secondary | ICD-10-CM | POA: Diagnosis not present

## 2024-02-24 DIAGNOSIS — J029 Acute pharyngitis, unspecified: Secondary | ICD-10-CM

## 2024-02-24 DIAGNOSIS — M25562 Pain in left knee: Secondary | ICD-10-CM | POA: Diagnosis not present

## 2024-02-24 LAB — POC COVID19/FLU A&B COMBO
Covid Antigen, POC: NEGATIVE
Influenza A Antigen, POC: NEGATIVE
Influenza B Antigen, POC: NEGATIVE

## 2024-02-24 LAB — POCT RAPID STREP A (OFFICE): Rapid Strep A Screen: NEGATIVE

## 2024-02-24 MED ORDER — CELECOXIB 100 MG PO CAPS: 100.0000 mg | ORAL_CAPSULE | Freq: Two times a day (BID) | ORAL | 0 refills | Status: AC | PRN

## 2024-02-24 NOTE — Progress Notes (Signed)
 Acute Office Visit  Subjective:     Patient ID: Anna Whitehead, female    DOB: 09/24/1985, 38 y.o.   MRN: [id number]  Chief Complaint  Patient presents with   Sore Throat    X2 days    HPI Patient is in today for complaints of sore throat. She is a new patient to myself. She states sore throat started yesterday. She also endorses complaints of associated rhinorrhea. She states she will be travelling prompting today's visit. She reports her son was ill with similar symptoms but his symptoms started two weeks ago, and reports strep testing was negative. She voices her other child with similar symptoms started in the last one week.    She also complains of left knee pain since recent move. She endorses left knee pain for the last two weeks. She voices she has been using ice therapy, heat therapy, bracing, and OTC tylenol . She voices the brace seemed to aggravate pain. She voices no true relief of symptoms with ice, heat, or Tylenol . She voices some improvement with topical analgesics. She voices hx of imaging showing arthritis however she reports x-ray was over one year ago. She notes interest in ortho referral for further evaluation of symptoms.   Review of Systems  Constitutional:  Negative for chills, fever and malaise/fatigue.  HENT:  Positive for congestion and sore throat. Negative for ear pain.   Respiratory:  Negative for cough.   Gastrointestinal:  Negative for diarrhea, nausea and vomiting.  Musculoskeletal:  Positive for joint pain.  Neurological:  Negative for headaches.        Objective:    BP 118/68   Pulse 65   Resp 16   Ht 5' 10 (1.778 m)   Wt 236 lb (107 kg)   SpO2 99%   BMI 33.86 kg/m    Physical Exam Constitutional:      Appearance: She is well-developed.  HENT:     Head: Normocephalic and atraumatic.     Mouth/Throat:     Pharynx: Oropharynx is clear. Uvula midline. No oropharyngeal exudate or posterior oropharyngeal erythema.     Tonsils:  No tonsillar exudate or tonsillar abscesses.  Eyes:     Conjunctiva/sclera: Conjunctivae normal.     Pupils: Pupils are equal, round, and reactive to light.  Cardiovascular:     Rate and Rhythm: Normal rate and regular rhythm.     Heart sounds: Normal heart sounds.  Pulmonary:     Effort: Pulmonary effort is normal. No respiratory distress.     Breath sounds: Normal breath sounds.  Skin:    General: Skin is warm and dry.  Neurological:     General: No focal deficit present.     Mental Status: She is alert.  Psychiatric:        Mood and Affect: Mood normal.        Behavior: Behavior normal.     Results for orders placed or performed in visit on 02/24/24  POCT rapid strep A  Result Value Ref Range   Rapid Strep A Screen Negative Negative  POC Covid19/Flu A&B Antigen  Result Value Ref Range   Influenza A Antigen, POC Negative Negative   Influenza B Antigen, POC Negative Negative   Covid Antigen, POC Negative Negative        Assessment & Plan:   1. Sore throat (Primary) Sore throat starting yesterday. She reports sick contacts/exposures. Rapid COVID, flu and strep testing negative. Strep culture sent for confirmation. Return precautions advised. -Recommended  symptom management including warm salt water gargles and OTC tylenol  as needed. - POCT rapid strep A - POC Covid19/Flu A&B Antigen - Culture, Group A Strep  2. Rhinorrhea Rhinorrhea for the last two days associated with sore throat. Rapid testing negative. Recommended symptomatic management including OTC antihistamine such as Claritin or Zyrtec.  - POC Covid19/Flu A&B Antigen  3. Left knee pain, unspecified chronicity Left knee pain following recent move. Denies known injury or trauma during move but states pain as quickly increased in intensity. Tried and failed bracing, ice therapy, heat therapy, and OTC Tylenol . She voices interest in ortho referral. She reports prior x-ray imaging done at least one year ago or more  reported findings of arthritis.  -Start Celebrex  100mg  BID PRN pain. Advised that when taking Celebrex  she CANNOT take other OTC NSAIDs. Advised ok to take OTC Tylenol  as needed.  - Ambulatory referral to Orthopedic Surgery - celecoxib  (CELEBREX ) 100 MG capsule; Take 1 capsule (100 mg total) by mouth 2 (two) times daily as needed (knee pain).  Dispense: 30 capsule; Refill: 0    Meds ordered this encounter  Medications   celecoxib  (CELEBREX ) 100 MG capsule    Sig: Take 1 capsule (100 mg total) by mouth 2 (two) times daily as needed (knee pain).    Dispense:  30 capsule    Refill:  0    Return if symptoms worsen or fail to improve.  LAYMON LOISE CORE, FNP

## 2024-02-26 ENCOUNTER — Ambulatory Visit: Payer: Self-pay | Admitting: Family Medicine

## 2024-02-26 LAB — CULTURE, GROUP A STREP
Micro Number: 17338174
SPECIMEN QUALITY:: ADEQUATE

## 2024-03-16 ENCOUNTER — Other Ambulatory Visit: Payer: Self-pay | Admitting: Family Medicine

## 2024-03-16 NOTE — Telephone Encounter (Signed)
 Requested medication (s) are due for refill today: Yes  Requested medication (s) are on the active medication list: Yes  Last refill:  02/05/24  Future visit scheduled: Yes  Notes to clinic:  Unable to refill per protocol, cannot delegate.      Requested Prescriptions  Pending Prescriptions Disp Refills   lisdexamfetamine  (VYVANSE ) 40 MG capsule [Pharmacy Med Name: LISDEXAMFETAMINE  DIMESYLATE 40 MG C] 30 capsule     Sig: TAKE 1 CAPSULE BY MOUTH EVERY MORNING     Not Delegated - Psychiatry:  Stimulants/ADHD Failed - 03/16/2024  5:40 PM      Failed - This refill cannot be delegated      Failed - Urine Drug Screen completed in last 360 days      Passed - Last BP in normal range    BP Readings from Last 1 Encounters:  02/24/24 118/68         Passed - Last Heart Rate in normal range    Pulse Readings from Last 1 Encounters:  02/24/24 65         Passed - Valid encounter within last 6 months    Recent Outpatient Visits           3 weeks ago Sore throat   [facility] [place], Laymon SAILOR, FNP   3 months ago Obesity (BMI 30-39.9)   [facility] Glenard Mire, MD   7 months ago Attention deficit hyperactivity disorder (ADHD), predominantly inattentive type   [facility] Glenard Mire, MD   9 months ago Inattention   [facility] Gareth Mliss FALCON, FNP   9 months ago Well adult exam   [facility] Glenard Mire, MD       Future Appointments             In 2 months Glenard, Krichna, MD [facility] [place]

## 2024-03-18 ENCOUNTER — Other Ambulatory Visit: Payer: Self-pay | Admitting: Family Medicine

## 2024-03-18 MED ORDER — LISDEXAMFETAMINE DIMESYLATE 40 MG PO CAPS: 40.0000 mg | ORAL_CAPSULE | ORAL | 0 refills | Status: DC

## 2024-03-18 NOTE — Telephone Encounter (Signed)
 Lvm to sch appt sooner than March

## 2024-03-28 ENCOUNTER — Other Ambulatory Visit (HOSPITAL_COMMUNITY): Payer: Self-pay

## 2024-03-28 ENCOUNTER — Encounter: Payer: Self-pay | Admitting: Oncology

## 2024-03-28 ENCOUNTER — Telehealth: Payer: Self-pay | Admitting: Pharmacy Technician

## 2024-03-28 NOTE — Telephone Encounter (Signed)
 Pharmacy Patient Advocate Encounter   Received notification from CoverMyMeds that prior authorization for Omeprazole  40MG  dr capsules is required/requested.   Insurance verification completed.   The patient is insured through [facility].   Per test claim: PA required; PA started via CoverMyMeds. KEY [id number] . Waiting for clinical questions to populate.

## 2024-03-28 NOTE — Telephone Encounter (Signed)
 Pharmacy Patient Advocate Encounter  Received notification from [facility] that Prior Authorization for Omeprazole  40MG  dr capsules has been APPROVED from 03/28/24 to 03/28/27. Ran test claim, Copay is $2.65. This test claim was processed through [facility]- copay amounts may vary at other pharmacies due to pharmacy/plan contracts, or as the patient moves through the different stages of their insurance plan.   PA #/Case ID/Reference #: [id number]

## 2024-04-22 ENCOUNTER — Ambulatory Visit (INDEPENDENT_AMBULATORY_CARE_PROVIDER_SITE_OTHER): Admitting: Family Medicine

## 2024-04-22 ENCOUNTER — Encounter: Payer: Self-pay | Admitting: Oncology

## 2024-04-22 ENCOUNTER — Other Ambulatory Visit: Payer: Self-pay | Admitting: Family Medicine

## 2024-04-22 ENCOUNTER — Encounter: Payer: Self-pay | Admitting: Family Medicine

## 2024-04-22 VITALS — BP 110/74 | HR 58 | Resp 16 | Ht 70.0 in | Wt 229.9 lb

## 2024-04-22 DIAGNOSIS — F900 Attention-deficit hyperactivity disorder, predominantly inattentive type: Secondary | ICD-10-CM

## 2024-04-22 DIAGNOSIS — K219 Gastro-esophageal reflux disease without esophagitis: Secondary | ICD-10-CM

## 2024-04-22 DIAGNOSIS — E66811 Obesity, class 1: Secondary | ICD-10-CM

## 2024-04-22 MED ORDER — LISDEXAMFETAMINE DIMESYLATE 40 MG PO CAPS: 40.0000 mg | ORAL_CAPSULE | ORAL | 0 refills | Status: AC

## 2024-04-22 MED ORDER — OMEPRAZOLE 40 MG PO CPDR: 40.0000 mg | DELAYED_RELEASE_CAPSULE | Freq: Every day | ORAL | 1 refills | Status: AC

## 2024-04-22 NOTE — Progress Notes (Signed)
 Name: Anna Whitehead   MRN: [id number]    DOB: [birth year]   Date:04/22/2024       Progress Note  Subjective  Chief Complaint  Chief Complaint  Patient presents with   Medical Management of Chronic Issues   Discussed the use of AI scribe software for clinical note transcription with the patient, who gave verbal consent to proceed.  History of Present Illness Anna Whitehead is a 39 year old female who presents for a medication check for ADHD.  She is currently taking Vyvanse  40 mg every morning, which aids in focus and productivity. However, she experiences a lack of appetite as a side effect, necessitating intentional eating habits, such as setting timers to remind herself to eat and incorporating protein shakes into her diet. She has noticed significant weight loss, from 251 lbs to 229 lbs since September.  She has a history of bradycardia, first identified postpartum after her second child. She wore a heart rate monitor at home and had a Holter monitor three years ago. Her heart rate has been noted to drop at night. No recent presyncope episodes and she has not seen a cardiologist since early 2022.  She recently separated from her partner, Cle[place], and moved to a new place two months ago. She shares custody of her children, with a one-week-on, one-week-off arrangement. She feels 'much lighter' and appreciates having her own space.  Regarding her asthma, she has not needed to refill her inhaler recently and has not experienced breathing problems. She takes omeprazole  for reflux and requires ongoing use to manage symptoms. She previously used Celebrex  for knee pain, which has since improved.  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 She appreciates the flexibility and creative work, and the job pays better than her previous position. She lives in [place], which is closer to her children's  school.    Patient Active Problem List   Diagnosis Date Noted   Acute cholecystitis 01/09/2024   Gastroesophageal reflux disease without esophagitis 12/02/2023   New daily persistent headache 06/10/2022   At risk for obstructive sleep apnea 04/13/2020   Bradycardia following surgery 02/05/2020   Abnormal MRI of head 02/04/2020   Anemia during pregnancy in second trimester 10/03/2019   Obesity (BMI 30-39.9) 08/29/2019   Family history of breast cancer 07/18/2019   Depression, major, recurrent, moderate (HCC) 07/18/2016   B12 deficiency 07/18/2016   Vitamin D  insufficiency 07/18/2016   History of gestational hypertension 11/14/2015   Asthma, mild 10/13/2014   Anxiety 10/13/2014    Past Surgical History:  Procedure Laterality Date   CESAREAN SECTION  02/04/2020   Procedure: CESAREAN SECTION;  Surgeon: Leonce Garnette BIRCH, MD;  Location: [facility] ORS;  Service: Obstetrics;;   CHOLECYSTECTOMY N/A 01/10/2024   Procedure: LAPAROSCOPIC CHOLECYSTECTOMY;  Surgeon: Lyndel Deward PARAS, MD;  Location: [facility] OR;  Service: General;  Laterality: N/A;    Family History  Problem Relation Age of Onset   Bipolar disorder Mother    Breast cancer Maternal Aunt 40   Breast cancer Maternal Aunt 50   Pancreatic cancer Maternal Aunt 34   Ovarian cancer Maternal Great-grandmother     Social History   Tobacco Use   Smoking status: Never   Smokeless tobacco: Never  Substance Use Topics   Alcohol use: Yes    Comment: occasionally    Current Medications[1]  Allergies[2]  I personally reviewed active problem list, medication list, allergies, family history with the  patient/caregiver today.   ROS  Ten systems reviewed and is negative except as mentioned in HPI    Objective Physical Exam  CONSTITUTIONAL: Patient appears well-developed and well-nourished.  No distress. HEENT: Head atraumatic, normocephalic, neck supple. CARDIOVASCULAR: Normal rate, regular rhythm and normal heart sounds.  No  murmur heard. No BLE edema. PULMONARY: Effort normal and breath sounds normal. No respiratory distress. ABDOMINAL: There is no tenderness or distention. MUSCULOSKELETAL: Normal gait. Without gross motor or sensory deficit. PSYCHIATRIC: Patient has a normal mood and affect. behavior is normal. Judgment and thought content normal.  Vitals:   04/22/24 1400  BP: 110/74  Pulse: (!) 58  Resp: 16  SpO2: 98%  Weight: 229 lb 14.4 oz (104.3 kg)  Height: 5' 10 (1.778 m)    Body mass index is 32.99 kg/m.  Recent Results (from the past 2160 hours)  Culture, Group A Strep     Status: None   Collection Time: 02/24/24  3:54 PM   Specimen: Throat  Result Value Ref Range   Micro Number [id number]    SPECIMEN QUALITY: Adequate    SOURCE: THROAT    STATUS: FINAL    RESULT: No group A Streptococcus isolated   POCT rapid strep A     Status: None   Collection Time: 02/24/24  3:55 PM  Result Value Ref Range   Rapid Strep A Screen Negative Negative  POC Covid19/Flu A&B Antigen     Status: None   Collection Time: 02/24/24  3:55 PM  Result Value Ref Range   Influenza A Antigen, POC Negative Negative   Influenza B Antigen, POC Negative Negative   Covid Antigen, POC Negative Negative    Diabetic Foot Exam:     PHQ2/9:    04/22/2024    1:59 PM 12/02/2023    8:13 AM 08/18/2023    1:53 PM 05/22/2023   10:19 AM 04/22/2023   10:17 AM  Depression screen PHQ 2/9  Decreased Interest 0 0 1 2 2   Down, Depressed, Hopeless 0 0 1 2 2   PHQ - 2 Score 0 0 2 4 4   Altered sleeping 0 0 1 2 2   Tired, decreased energy 0 0 1 2 2   Change in appetite 0 0 1 2 2   Feeling bad or failure about yourself  0 0 1 2 2   Trouble concentrating 0 0 1 2 2   Moving slowly or fidgety/restless 0 0 0 0 0  Suicidal thoughts 0 0 0 0 0  PHQ-9 Score 0 0  7  14  14    Difficult doing work/chores Not difficult at all Not difficult at all Somewhat difficult Very difficult Very difficult     Data saved with a previous flowsheet row  definition    phq 9 is negative  Fall Risk:    04/22/2024    1:59 PM 12/02/2023    8:10 AM 08/18/2023    1:49 PM 04/22/2023   10:17 AM 01/01/2022    1:04 PM  Fall Risk   Falls in the past year? 0 0 0 0 0  Number falls in past yr: 0 0 0 0 0  Injury with Fall? 0 0  0  0  0   Risk for fall due to : No Fall Risks No Fall Risks No Fall Risks No Fall Risks   Follow up Falls evaluation completed Falls evaluation completed Falls prevention discussed;Education provided;Falls evaluation completed Falls prevention discussed;Education provided;Falls evaluation completed      Data saved with a previous  flowsheet row definition      Assessment & Plan Attention-deficit hyperactivity disorder, predominantly inattentive type ADHD managed with Vyvanse  40 mg daily, improving focus and productivity. Lack of appetite noted. - Continue Vyvanse  40 mg daily. - Prescribed Vyvanse  with 30-day supply due to insurance issues. - Advised follow-up every three months for prescription.  Obesity Weight reduced from 251 lbs to 229 lbs since September. Interest in GLP-1 agonists, insurance coverage uncertain. Current weight loss through diet and exercise. - Contact insurance to confirm GLP-1 agonists coverage. - Continue current diet and exercise regimen.  Gastroesophageal reflux disease GERD managed with omeprazole  40 mg daily. Symptoms recur if stopped. - Continue omeprazole  40 mg daily. - Prescribed omeprazole  with 90-day supply.  Sinus bradycardia Heart rate in fifties, previously monitored. No recent presyncope or syncope. Not dangerous currently. - Monitor for symptoms such as syncope or chest pain. - No immediate intervention unless symptoms develop.        [1]  Current Outpatient Medications:    celecoxib  (CELEBREX ) 100 MG capsule, Take 1 capsule (100 mg total) by mouth 2 (two) times daily as needed (knee pain)., Disp: 30 capsule, Rfl: 0   levonorgestrel  (MIRENA ) 20 MCG/24HR IUD, 1 Intra Uterine  Device (1 each total) by Intrauterine route once for 1 dose., Disp: 1 each, Rfl: 0   lisdexamfetamine  (VYVANSE ) 40 MG capsule, Take 1 capsule (40 mg total) by mouth every morning., Disp: 30 capsule, Rfl: 0   omeprazole  (PRILOSEC) 40 MG capsule, Take 1 capsule (40 mg total) by mouth daily., Disp: 90 capsule, Rfl: 1   Albuterol -Budesonide  (AIRSUPRA ) 90-80 MCG/ACT AERO, Inhale 2 puffs into the lungs in the morning, at noon, in the evening, and at bedtime. (Patient not taking: Reported on 04/22/2024), Disp: 10.7 g, Rfl: 1 [2] No Known Allergies

## 2024-05-27 ENCOUNTER — Encounter: Admitting: Family Medicine

## 2024-07-01 ENCOUNTER — Ambulatory Visit: Admitting: Family Medicine
